# Patient Record
Sex: Male | Born: 1965 | Race: Black or African American | Hispanic: No | Marital: Single | Smoking: Never smoker
Health system: Southern US, Community
[De-identification: ages and names within clinical notes are randomized; demographics above are authoritative.]

## PROBLEM LIST (undated history)

## (undated) DIAGNOSIS — M1990 Unspecified osteoarthritis, unspecified site: Secondary | ICD-10-CM

## (undated) DIAGNOSIS — N3281 Overactive bladder: Secondary | ICD-10-CM

## (undated) DIAGNOSIS — I10 Essential (primary) hypertension: Secondary | ICD-10-CM

## (undated) DIAGNOSIS — E119 Type 2 diabetes mellitus without complications: Secondary | ICD-10-CM

## (undated) DIAGNOSIS — M778 Other enthesopathies, not elsewhere classified: Secondary | ICD-10-CM

## (undated) HISTORY — PX: BACK SURGERY: SHX140

## (undated) HISTORY — DX: Other enthesopathies, not elsewhere classified: M77.8

## (undated) HISTORY — DX: Essential (primary) hypertension: I10

---

## 2005-12-23 ENCOUNTER — Emergency Department (HOSPITAL_COMMUNITY): Admission: EM | Admit: 2005-12-23 | Discharge: 2005-12-23 | Payer: Self-pay | Admitting: Family Medicine

## 2005-12-25 ENCOUNTER — Emergency Department (HOSPITAL_COMMUNITY): Admission: EM | Admit: 2005-12-25 | Discharge: 2005-12-25 | Payer: Self-pay | Admitting: Family Medicine

## 2005-12-28 ENCOUNTER — Emergency Department (HOSPITAL_COMMUNITY): Admission: EM | Admit: 2005-12-28 | Discharge: 2005-12-28 | Payer: Self-pay | Admitting: Family Medicine

## 2006-03-02 ENCOUNTER — Ambulatory Visit: Payer: Self-pay | Admitting: Internal Medicine

## 2006-03-08 ENCOUNTER — Ambulatory Visit: Payer: Self-pay | Admitting: Internal Medicine

## 2006-04-09 ENCOUNTER — Ambulatory Visit: Payer: Self-pay | Admitting: Internal Medicine

## 2006-04-30 ENCOUNTER — Ambulatory Visit: Payer: Self-pay | Admitting: Internal Medicine

## 2006-04-30 LAB — CONVERTED CEMR LAB
Calcium: 8.9 mg/dL (ref 8.4–10.5)
GFR calc non Af Amer: 78 mL/min
Glomerular Filtration Rate, Af Am: 94 mL/min/{1.73_m2}
Glucose, Bld: 97 mg/dL (ref 70–99)

## 2006-05-10 ENCOUNTER — Ambulatory Visit: Payer: Self-pay | Admitting: Internal Medicine

## 2006-09-11 ENCOUNTER — Ambulatory Visit: Payer: Self-pay | Admitting: Internal Medicine

## 2006-09-11 DIAGNOSIS — I10 Essential (primary) hypertension: Secondary | ICD-10-CM | POA: Insufficient documentation

## 2006-09-13 ENCOUNTER — Ambulatory Visit: Payer: Self-pay | Admitting: *Deleted

## 2006-09-17 ENCOUNTER — Ambulatory Visit: Payer: Self-pay | Admitting: Internal Medicine

## 2006-09-25 ENCOUNTER — Ambulatory Visit: Payer: Self-pay | Admitting: Internal Medicine

## 2006-10-11 ENCOUNTER — Ambulatory Visit: Payer: Self-pay | Admitting: Internal Medicine

## 2007-02-14 ENCOUNTER — Encounter: Payer: Self-pay | Admitting: Internal Medicine

## 2007-09-26 ENCOUNTER — Ambulatory Visit: Payer: Self-pay | Admitting: Internal Medicine

## 2007-09-26 ENCOUNTER — Ambulatory Visit (HOSPITAL_COMMUNITY): Admission: RE | Admit: 2007-09-26 | Discharge: 2007-09-26 | Payer: Self-pay | Admitting: Internal Medicine

## 2007-10-14 ENCOUNTER — Telehealth (INDEPENDENT_AMBULATORY_CARE_PROVIDER_SITE_OTHER): Payer: Self-pay | Admitting: Internal Medicine

## 2007-10-25 ENCOUNTER — Ambulatory Visit: Payer: Self-pay | Admitting: Internal Medicine

## 2007-10-31 ENCOUNTER — Encounter (INDEPENDENT_AMBULATORY_CARE_PROVIDER_SITE_OTHER): Payer: Self-pay | Admitting: Internal Medicine

## 2007-10-31 ENCOUNTER — Encounter: Admission: RE | Admit: 2007-10-31 | Discharge: 2008-01-10 | Payer: Self-pay | Admitting: Internal Medicine

## 2007-11-11 ENCOUNTER — Ambulatory Visit: Payer: Self-pay | Admitting: Internal Medicine

## 2007-11-20 ENCOUNTER — Encounter (INDEPENDENT_AMBULATORY_CARE_PROVIDER_SITE_OTHER): Payer: Self-pay | Admitting: Internal Medicine

## 2007-12-19 ENCOUNTER — Ambulatory Visit: Payer: Self-pay | Admitting: Internal Medicine

## 2007-12-19 DIAGNOSIS — R609 Edema, unspecified: Secondary | ICD-10-CM | POA: Insufficient documentation

## 2008-01-02 ENCOUNTER — Ambulatory Visit: Payer: Self-pay | Admitting: Internal Medicine

## 2008-01-06 ENCOUNTER — Telehealth (INDEPENDENT_AMBULATORY_CARE_PROVIDER_SITE_OTHER): Payer: Self-pay | Admitting: *Deleted

## 2008-01-16 ENCOUNTER — Encounter (INDEPENDENT_AMBULATORY_CARE_PROVIDER_SITE_OTHER): Payer: Self-pay | Admitting: *Deleted

## 2008-01-28 ENCOUNTER — Ambulatory Visit: Payer: Self-pay | Admitting: Internal Medicine

## 2008-04-02 ENCOUNTER — Encounter (INDEPENDENT_AMBULATORY_CARE_PROVIDER_SITE_OTHER): Payer: Self-pay | Admitting: Internal Medicine

## 2008-05-22 ENCOUNTER — Telehealth (INDEPENDENT_AMBULATORY_CARE_PROVIDER_SITE_OTHER): Payer: Self-pay | Admitting: Internal Medicine

## 2008-06-12 ENCOUNTER — Ambulatory Visit: Payer: Self-pay | Admitting: Internal Medicine

## 2008-06-12 LAB — CONVERTED CEMR LAB
Bilirubin Urine: NEGATIVE
Nitrite: NEGATIVE
Urobilinogen, UA: 0.2

## 2008-06-19 LAB — CONVERTED CEMR LAB
Alkaline Phosphatase: 88 units/L (ref 39–117)
BUN: 17 mg/dL (ref 6–23)
Creatinine, Ser: 0.96 mg/dL (ref 0.40–1.50)
Glucose, Bld: 109 mg/dL — ABNORMAL HIGH (ref 70–99)
Total Bilirubin: 0.5 mg/dL (ref 0.3–1.2)

## 2008-06-26 ENCOUNTER — Ambulatory Visit: Payer: Self-pay | Admitting: Internal Medicine

## 2008-06-26 LAB — CONVERTED CEMR LAB: Blood Glucose, Fingerstick: 107

## 2008-07-14 ENCOUNTER — Ambulatory Visit: Payer: Self-pay | Admitting: Internal Medicine

## 2008-07-23 ENCOUNTER — Ambulatory Visit: Payer: Self-pay | Admitting: Internal Medicine

## 2008-07-23 LAB — CONVERTED CEMR LAB
Blood in Urine, dipstick: NEGATIVE
Glucose, Urine, Semiquant: NEGATIVE

## 2008-08-07 ENCOUNTER — Ambulatory Visit: Payer: Self-pay | Admitting: Internal Medicine

## 2008-08-07 LAB — CONVERTED CEMR LAB
BUN: 16 mg/dL (ref 6–23)
Band Neutrophils: 0 % (ref 0–10)
Basophils Absolute: 0 10*3/uL (ref 0.0–0.1)
Basophils Relative: 0 % (ref 0–1)
Bilirubin Urine: NEGATIVE
Calcium: 9.3 mg/dL (ref 8.4–10.5)
Cholesterol: 137 mg/dL (ref 0–200)
Creatinine, Ser: 0.89 mg/dL (ref 0.40–1.50)
Eosinophils Relative: 1 % (ref 0–5)
Glucose, Bld: 99 mg/dL (ref 70–99)
Glucose, Urine, Semiquant: NEGATIVE
Lymphocytes Relative: 46 % (ref 12–46)
MCV: 82 fL (ref 78.0–100.0)
OCCULT 1: NEGATIVE
OCCULT 2: NEGATIVE
Platelets: 232 10*3/uL (ref 150–400)
Potassium: 4.2 meq/L (ref 3.5–5.3)
RDW: 14.3 % (ref 11.5–15.5)
Triglycerides: 80 mg/dL (ref <150)
Urobilinogen, UA: 0.2
VLDL: 16 mg/dL (ref 0–40)
WBC Urine, dipstick: NEGATIVE
WBC: 4.6 10*3/uL (ref 4.0–10.5)
pH: 6

## 2008-08-23 ENCOUNTER — Encounter (INDEPENDENT_AMBULATORY_CARE_PROVIDER_SITE_OTHER): Payer: Self-pay | Admitting: Internal Medicine

## 2008-09-22 ENCOUNTER — Encounter (INDEPENDENT_AMBULATORY_CARE_PROVIDER_SITE_OTHER): Payer: Self-pay | Admitting: Internal Medicine

## 2008-09-22 ENCOUNTER — Ambulatory Visit: Payer: Self-pay | Admitting: Internal Medicine

## 2008-11-05 ENCOUNTER — Encounter (INDEPENDENT_AMBULATORY_CARE_PROVIDER_SITE_OTHER): Payer: Self-pay | Admitting: Internal Medicine

## 2009-01-22 ENCOUNTER — Ambulatory Visit: Payer: Self-pay | Admitting: Internal Medicine

## 2009-01-22 DIAGNOSIS — E663 Overweight: Secondary | ICD-10-CM | POA: Insufficient documentation

## 2009-06-05 HISTORY — PX: TENDON RELEASE: SHX230

## 2009-07-01 ENCOUNTER — Telehealth (INDEPENDENT_AMBULATORY_CARE_PROVIDER_SITE_OTHER): Payer: Self-pay | Admitting: Internal Medicine

## 2009-07-16 ENCOUNTER — Ambulatory Visit: Payer: Self-pay | Admitting: Internal Medicine

## 2009-07-16 DIAGNOSIS — E1139 Type 2 diabetes mellitus with other diabetic ophthalmic complication: Secondary | ICD-10-CM | POA: Insufficient documentation

## 2009-07-16 LAB — CONVERTED CEMR LAB
Bilirubin Urine: NEGATIVE
Blood in Urine, dipstick: NEGATIVE
Glucose, Urine, Semiquant: NEGATIVE
Ketones, urine, test strip: NEGATIVE
OCCULT 1: NEGATIVE
OCCULT 2: NEGATIVE
Protein, U semiquant: 30
Urobilinogen, UA: 1

## 2009-07-30 ENCOUNTER — Ambulatory Visit: Payer: Self-pay | Admitting: Internal Medicine

## 2009-07-30 LAB — CONVERTED CEMR LAB
Bilirubin Urine: NEGATIVE
Glucose, Urine, Semiquant: NEGATIVE
Hgb A1c MFr Bld: 5.8 %
Protein, U semiquant: NEGATIVE
WBC Urine, dipstick: NEGATIVE
pH: 6

## 2009-08-07 LAB — CONVERTED CEMR LAB
Alkaline Phosphatase: 61 units/L (ref 39–117)
Basophils Absolute: 0 10*3/uL (ref 0.0–0.1)
Basophils Relative: 0 % (ref 0–1)
CO2: 26 meq/L (ref 19–32)
Cholesterol: 139 mg/dL (ref 0–200)
Creatinine, Ser: 0.92 mg/dL (ref 0.40–1.50)
Eosinophils Relative: 1 % (ref 0–5)
Glucose, Bld: 94 mg/dL (ref 70–99)
HCT: 47.3 % (ref 39.0–52.0)
HDL: 42 mg/dL (ref >39)
Hemoglobin: 15.9 g/dL (ref 13.0–17.0)
LDL Cholesterol: 85 mg/dL (ref 0–99)
Lymphocytes Relative: 53 % — ABNORMAL HIGH (ref 12–46)
MCHC: 33.6 g/dL (ref 30.0–36.0)
Monocytes Absolute: 0.3 10*3/uL (ref 0.1–1.0)
Neutro Abs: 1.4 10*3/uL — ABNORMAL LOW (ref 1.7–7.7)
Platelets: 212 10*3/uL (ref 150–400)
RDW: 14 % (ref 11.5–15.5)
Total Bilirubin: 0.8 mg/dL (ref 0.3–1.2)
Total CHOL/HDL Ratio: 3.3
Triglycerides: 61 mg/dL (ref <150)
VLDL: 12 mg/dL (ref 0–40)

## 2009-08-27 ENCOUNTER — Ambulatory Visit: Payer: Self-pay | Admitting: Internal Medicine

## 2009-09-22 ENCOUNTER — Encounter: Admission: RE | Admit: 2009-09-22 | Discharge: 2009-09-22 | Payer: Self-pay | Admitting: Internal Medicine

## 2009-09-23 ENCOUNTER — Encounter (INDEPENDENT_AMBULATORY_CARE_PROVIDER_SITE_OTHER): Payer: Self-pay | Admitting: Internal Medicine

## 2009-11-19 ENCOUNTER — Telehealth (INDEPENDENT_AMBULATORY_CARE_PROVIDER_SITE_OTHER): Payer: Self-pay | Admitting: Internal Medicine

## 2010-01-28 ENCOUNTER — Telehealth (INDEPENDENT_AMBULATORY_CARE_PROVIDER_SITE_OTHER): Payer: Self-pay | Admitting: Internal Medicine

## 2010-02-02 ENCOUNTER — Ambulatory Visit: Payer: Self-pay | Admitting: Internal Medicine

## 2010-02-22 ENCOUNTER — Ambulatory Visit: Payer: Self-pay | Admitting: Internal Medicine

## 2010-05-27 ENCOUNTER — Ambulatory Visit
Admission: RE | Admit: 2010-05-27 | Discharge: 2010-05-27 | Payer: Self-pay | Source: Home / Self Care | Attending: Orthopedic Surgery | Admitting: Orthopedic Surgery

## 2010-06-27 ENCOUNTER — Ambulatory Visit
Admission: RE | Admit: 2010-06-27 | Discharge: 2010-06-27 | Payer: Self-pay | Source: Home / Self Care | Attending: Internal Medicine | Admitting: Internal Medicine

## 2010-07-05 NOTE — Letter (Signed)
Summary: [facility]  [facility]   Imported By: Arta Bruce 11/18/2009 15:19:44  _____________________________________________________________________  External Attachment:    Type:   Image     Comment:   External Document

## 2010-07-05 NOTE — Progress Notes (Signed)
  Phone Note Call from Patient   Summary of Call: Pt still trying to get in with [facility],  but they told the pt if you would call they could get him in sooner.  Their number is [phone].  Also pt wanted to tell you thank you for everything you did for him while he was a pt here. Initial call taken by: Vesta Mixer CMA,  January 28, 2010 3:25 PM  Follow-up for Phone Call        Did he sign a release of information so we can send information to [facility]?  If not, he can do it there or here.   Follow-up by: Julieanne Manson MD,  February 02, 2010 8:22 AM  Additional Follow-up for Phone Call Additional follow up Details #1::        pt is now at [facility] and office notes have already been sent from our office Additional Follow-up by: Michelle Nasuti,  February 02, 2010 10:20 AM

## 2010-07-05 NOTE — Assessment & Plan Note (Signed)
Summary: 6 month fu for cpp///kt   Vital Signs:  Patient profile:   45 year old male Weight:      293.7 pounds BMI:     37.85 Temp:     97.9 degrees F oral Pulse rate:   87 / minute Pulse rhythm:   regular Resp:     18 per minute BP sitting:   143 / 90  (left arm) Cuff size:   large  Vitals Entered By: Geanie Cooley (July 16, 2009 3:58 PM) CC: Pt here for CPP. Pt states he has been having problems with his right foot and leg, hes been having numbness in it and the right leg and foot is swollen. Pt has questions about PPD, he thinks he has some of those symptoms in his leg. Is Patient Diabetic? No Pain Assessment Patient in pain? yes     Location: Right foot Intensity: 3 Type: numbness  Does patient need assistance? Functional Status Self care Ambulation Normal   CC:  Pt here for CPP. Pt states he has been having problems with his right foot and leg, hes been having numbness in it and the right leg and foot is swollen. Pt has questions about PPD, and he thinks he has some of those symptoms in his leg.Marland Kitchen  History of Present Illness: 45 yo male here for CPE.  Concerns:  1.  Hypertension:  Has not been taking HCTZ as he was not sure what he was supposed to be doing.  2.  Vision:  decreased in past year.  Has not tried getting reading glasses.  Does have insurance to go to an eye doctor.  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  Pain in right leg.  Knee and lower leg swollen at times.  Habits & Providers  Alcohol-Tobacco-Diet     Alcohol drinks/day: <1     Alcohol type: beer, cognac     Tobacco Status: never  Exercise-Depression-Behavior     Drug Use: marijuana occasionally several years ago.  Allergies (verified): No Known Drug Allergies  Past History:  Past Medical History: Reviewed history from 07/23/2008 and no changes required. JOINT EFFUSION, RIGHT KNEE (ICD-719.06) ANAL FISSURE (ICD-565.0) PERIPHERAL EDEMA (ICD-782.3) TENDINITIS, RIGHT KNEE  (ICD-726.69) BURSITIS, RIGHT KNEE (ICD-726.60) ESSENTIAL HYPERTENSION, BENIGN (ICD-401.1) FREQUENCY, URINARY (ICD-788.41) GROIN PAIN (ICD-789.09)  Past Surgical History: Reviewed history from 07/23/2008 and no changes required. None  Family History: Mother, 78:   Generally healthy,  Father, 84:  Hx of prostate Ca--diagnosed age 75.   2 Brothers, one 74 with hypertension, schizophrenia.  Other is healthy Sister, 19:  obese. Son, 12:  healthy  Social History: [employer]-does not want a blood transfusion--joined church in November Never married. Involved in son's life. [employer]--Maintenance electrician.Drug Use:  marijuana occasionally several years ago.  Review of Systems General:  Energy good.. Eyes:  Complains of blurring; Blurry with distance vision. ENT:  Denies decreased hearing. CV:  Denies chest pain or discomfort and palpitations; Has had right foot and leg swelling 3-4 months.  Has at times had swelling of the legs--right worse than left for years.. Resp:  Denies shortness of breath. GI:  Denies abdominal pain, bloody stools, constipation, dark tarry stools, and diarrhea. GU:  Urinary urge. Good stream No nocturia No hesitancy. Hx of chlamydia 3 years ago. MS:  Right knee and lower leg pain--right knee, however, doing better Right ankle has been more swollen and hurts--feels like it is sprained, but has not injured.  No definite erythema or heat  from the joint.. Derm:  Flaking and shininess on legs--right worse than left.  . Neuro:  Denies numbness, tingling, and weakness. Psych:  Denies anxiety, depression, and suicidal thoughts/plans; Feels well now that has a job.Marland Kitchen  Physical Exam  General:  obese,  no acute distress; alert,appropriate and cooperative throughout examination Head:  Normocephalic and atraumatic without obvious abnormalities. Eyes:  No corneal or conjunctival inflammation noted. EOMI. Perrla. Funduscopic exam benign, without hemorrhages, exudates  or papilledema. Vision grossly normal. Ears:  External ear exam shows no significant lesions or deformities.  Otoscopic examination reveals clear canals, tympanic membranes are intact bilaterally without bulging, retraction, inflammation or discharge. Hearing is grossly normal bilaterally. Nose:  External nasal examination shows no deformity or inflammation. Nasal mucosa are pink and moist without lesions or exudates. Mouth:  Oral mucosa and oropharynx without lesions or exudates.   Neck:  No deformities, masses, or tenderness noted. Lungs:  Normal respiratory effort, chest expands symmetrically. Lungs are clear to auscultation, no crackles or wheezes. Heart:  Normal rate and regular rhythm. S1 and S2 normal without gallop, murmur, click, rub or other extra sounds. Abdomen:  Bowel sounds positive,abdomen soft and non-tender without masses, organomegaly or hernias noted. Rectal:  No external abnormalities noted. Normal sphincter tone. No rectal masses or tenderness.  Heme negative light brown stool Genitalia:  Testes bilaterally descended without nodularity, tenderness or masses. No scrotal masses or lesions. No penis lesions or urethral discharge.  large pannus overlying genitalia Prostate:  Prostate gland firm and smooth, no enlargement, nodularity, tenderness, mass, asymmetry or induration. Msk:  No deformity or scoliosis noted of thoracic or lumbar spine.   Pulses:  R and L carotid,radial,femoral,dorsalis pedis and posterior tibial pulses are full and equal bilaterally Extremities:  Hypertrophic changes of knees.  No effusions or erythema. Broken small veins at edges of feet bilaterally. Shiny skin of pretibial areas, mild edema.  No erythema Neurologic:  No cranial nerve deficits noted. Station and gait are normal. Plantar reflexes are down-going bilaterally. DTRs are symmetrical throughout. Sensory, motor and coordinative functions appear intact. Skin:  Intact without suspicious lesions or  rashes Cervical Nodes:  No lymphadenopathy noted Axillary Nodes:  No palpable lymphadenopathy Inguinal Nodes:  No significant adenopathy Psych:  Cognition and judgment appear intact. Alert and cooperative with normal attention span and concentration. No apparent delusions, illusions, hallucinations   Impression & Recommendations:  Problem # 1:  HEALTH MAINTENANCE EXAM (ICD-V70.0)  Orders: Hemoccult Cards -3 specimans (take home) (21308)  Problem # 2:  DIABETIC  RETINOPATHY (ICD-250.50)  His updated medication list for this problem includes:    Cozaar 100 Mg Tabs (Losartan potassium) .Marland Kitchen... 1 tab by mouth daily  Orders: Ophthalmology Referral (Ophthalmology)  Problem # 3:  ESSENTIAL HYPERTENSION, BENIGN (ICD-401.1) not controlled His updated medication list for this problem includes:    Toprol Xl 25 Mg Xr24h-tab (Metoprolol succinate) .Marland Kitchen... 1 tab by mouth daily    Cozaar 100 Mg Tabs (Losartan potassium) .Marland Kitchen... 1 tab by mouth daily    Hydrochlorothiazide 25 Mg Tabs (Hydrochlorothiazide) .Marland Kitchen... 1/2 tab by mouth in morning  Orders: UA Dipstick w/o Micro (manual) (65784)  Problem # 4:  PERIPHERAL EDEMA (ICD-782.3) compression stockings His updated medication list for this problem includes:    Hydrochlorothiazide 25 Mg Tabs (Hydrochlorothiazide) .Marland Kitchen... 1/2 tab by mouth in morning  Problem # 5:  KNEE PAIN, RIGHT (ICD-719.46) will check labs with fasting blood work--do not believe this is gout r inflammatory joint problem, however.  Complete Medication List:  1)  Toprol Xl 25 Mg Xr24h-tab (Metoprolol succinate) .Marland Kitchen.. 1 tab by mouth daily 2)  Anusol-hc 25 Mg Supp (Hydrocortisone acetate) .Marland Kitchen.. 1 suppository per rectum after bowel movements and up to three times a day for pain 3)  Cozaar 100 Mg Tabs (Losartan potassium) .Marland Kitchen.. 1 tab by mouth daily 4)  Hydrochlorothiazide 25 Mg Tabs (Hydrochlorothiazide) .... 1/2 tab by mouth in morning 5)  Knee High Graduated Compression Stockings- 20 Mm   .Marland Kitchen.. 782.3--peripheral edema  Patient Instructions: 1)  Return in next 2 weeks for fasting labs:  FLP, A1C, CMET, UA, CBC, Uric Acid, sed rate, BP Check--take your meds that morning 2)  Bring in stool cards then 3)  Follow up with Dr. Delrae Alfred in 6 months --htn Prescriptions: HYDROCHLOROTHIAZIDE 25 MG TABS (HYDROCHLOROTHIAZIDE) 1/2 tab by mouth in morning  #30 x 11   Entered and Authorized by:   Julieanne Manson MD   Signed by:   Julieanne Manson MD on 07/16/2009   Method used:   Print then Give to Patient   RxID:   [id number] COZAAR 100 MG TABS (LOSARTAN POTASSIUM) 1 tab by mouth daily  #30 x 11   Entered and Authorized by:   Julieanne Manson MD   Signed by:   Julieanne Manson MD on 07/16/2009   Method used:   Print then Give to Patient   RxID:   [id number] TOPROL XL 25 MG  XR24H-TAB (METOPROLOL SUCCINATE) 1 tab by mouth daily  #30 x 11   Entered and Authorized by:   Julieanne Manson MD   Signed by:   Julieanne Manson MD on 07/16/2009   Method used:   Print then Give to Patient   RxID:   [id number] KNEE HIGH GRADUATED COMPRESSION STOCKINGS-  20 MM 782.3--peripheral edema  #2 pair x 0   Entered and Authorized by:   Julieanne Manson MD   Signed by:   Julieanne Manson MD on 07/16/2009   Method used:   Print then Give to Patient   RxID:   [id number] HYDROCHLOROTHIAZIDE 25 MG TABS (HYDROCHLOROTHIAZIDE) 1/2 tab by mouth in morning  #30 x 11   Entered and Authorized by:   Julieanne Manson MD   Signed by:   Julieanne Manson MD on 07/16/2009   Method used:   Faxed to ...       [facility] (retail)       [address] [place], [place]  [address]       Ph: [phone]       Fax: [phone]   RxID:   [id number]    Preventive Care Screening  Prior Values:    PSA:  0.34 (08/07/2008)    Last Tetanus Booster:  Tdap (07/23/2008)     Immunizations:  has not been immunized against the  flu--discussed. STE:  Regular in shower Guaiac Cards:  states he brought in last year--but no documentation of such Colonoscopy:  no   Laboratory Results   Urine Tests  Date/Time Received: July 16, 2009 4:24 PM   Routine Urinalysis   Color: lt. yellow Appearance: Clear Glucose: negative   (Normal Range: Negative) Bilirubin: negative   (Normal Range: Negative) Ketone: negative   (Normal Range: Negative) Spec. Gravity: 1.020   (Normal Range: 1.003-1.035) Blood: negative   (Normal Range: Negative) pH: 6.0   (Normal Range: 5.0-8.0) Protein: 30   (Normal Range: Negative) Urobilinogen: 1.0   (Normal Range: 0-1) Nitrite: negative   (Normal Range: Negative)  Leukocyte Esterace: small   (Normal Range: Negative)      Stool - Occult Blood Hemmoccult #1: negative Date: 07/30/2009 Hemoccult #2: negative Date: 07/30/2009 Hemoccult #3: negative Date: 07/30/2009

## 2010-07-05 NOTE — Progress Notes (Signed)
  Phone Note Call from Patient Call back at [phone]   Summary of Call: The pt wants the Dr Delrae Alfred call him back because he wanted to know which physician should the provider referral him to [facility] because he  has [employer] card.  This in reference blood pressue and boarder dm.  [facility] [address] [phone]) Delrae Alfred MD  Initial call taken by: Manon Hilding,  November 19, 2009 4:09 PM  Follow-up for Phone Call        Does he have a list of the providers? Follow-up by: Julieanne Manson MD,  November 22, 2009 6:13 PM  Additional Follow-up for Phone Call Additional follow up Details #1::        Spoke with pt he does not have a book since almost everyone takes it, just would like to know who you reccomened as a good pcp for him. Additional Follow-up by: Vesta Mixer CMA,  November 23, 2009 11:34 AM    Additional Follow-up for Phone Call Additional follow up Details #2::    I would go to [facility]  [phone]. Follow-up by: Julieanne Manson MD,  November 24, 2009 6:07 PM  Additional Follow-up for Phone Call Additional follow up Details #3:: Details for Additional Follow-up Action Taken: pt aware.............. Elmarie Shiley McCoy CMA  November 25, 2009 11:59 AM

## 2010-07-05 NOTE — Progress Notes (Signed)
Summary: medication clarifications  Phone Note Call from Patient Call back at [phone]   Reason for Call: Talk to Nurse Summary of Call: Tanzania Basham PT. MR Gresham SAYS THAT WHEN HE PICKEDUP HIS HYDROCHLOROTHIAZIDE THEY GAVE HIM THE 25MG  AND IT SAYS TAKE I TAB. DAILY. HIS QUESTION IS, IS WHEN HE WAS TAKING THEM BEFORE THE REFILL, HE WAS TAKING 12.5 1/2 TABLET AND THEY ONLY GAVE HIM 15, SO IS HE SUPOSE TO BE TAKING THE 25MG  1 TABLET? Initial call taken by: Leodis Rains,  July 01, 2009 10:17 AM  Follow-up for Phone Call        will have Nathan Moctezuma to review medications...in chart states hctz 25mg  1daily unless pt was told verbally to make change. left message for pt to return call......... Follow-up by: Mikey College CMA,  July 01, 2009 12:46 PM  Additional Follow-up for Phone Call Additional follow up Details #1::        spoke with patient and he states that take hctz 25mg  1/2 tab was given last month and this month rx stated hctz 25mg  daily.  currently pt is taking hctz 25mg  1/2 tab                                  toprol 25mg  1 daily                                  hyzaar 100-12.5mg mg                                   (take with hctz) Pt needs clarification on medications..waiting on printout from the pharmacy Additional Follow-up by: Mikey College CMA,  July 01, 2009 4:18 PM    Additional Follow-up for Phone Call Additional follow up Details #2::    Called--left message that he should indeed be taking 1/2 tab of HCTZ 25 mg. He is to let me know, however, if he is also taking Hyzaar, because then he is doubling up on the HCTZ.  He really should just be taking Cozaar, not hyzaar. Called and left message to let us know the above again   Julieanne Manson MD  July 06, 2009 5:51 PM  Follow-up by: Julieanne Manson MD,  July 01, 2009 5:47 PM  Additional Follow-up for Phone Call Additional follow up Details #3:: Details for Additional Follow-up Action Taken: Pt called back and  has bottles for Toprol 25, Hyzaar 100/12.5 one po qd and Hctz 25mg  one po qd with Cozaar, but no Cozaar bottle. Pt can be reached at [phone]................ Elmarie Shiley McCoy CMA  July 08, 2009 9:18 AM Please call [facility] pharmacy and clarify what they are giving him--make sure he has picked up appropriate meds.  He was to be switched from Hyzaar to Cozaar--I though they did not have the Hyzaar.  Julieanne Manson MD  July 13, 2009 9:10 AM    Spoke with Danella Deis? at [facility] pharmacy.  She shows pt picking up on 06/29/09 for losartan, toprol and hctz no hyzaar. Pt has an appt.this Friday for a CPE.................. Tiffany McCoy CMA  July 14, 2009 11:13 AM

## 2010-07-18 ENCOUNTER — Ambulatory Visit: Payer: Self-pay | Admitting: Internal Medicine

## 2010-07-29 ENCOUNTER — Ambulatory Visit: Payer: Self-pay | Admitting: Internal Medicine

## 2010-08-04 ENCOUNTER — Ambulatory Visit (INDEPENDENT_AMBULATORY_CARE_PROVIDER_SITE_OTHER): Payer: BC Managed Care – PPO | Admitting: Internal Medicine

## 2010-08-04 DIAGNOSIS — I10 Essential (primary) hypertension: Secondary | ICD-10-CM

## 2010-08-15 LAB — POCT HEMOGLOBIN-HEMACUE: Hemoglobin: 15.8 g/dL (ref 13.0–17.0)

## 2010-08-15 LAB — BASIC METABOLIC PANEL
BUN: 18 mg/dL (ref 6–23)
CO2: 23 mEq/L (ref 19–32)
Chloride: 105 mEq/L (ref 96–112)
Glucose, Bld: 120 mg/dL — ABNORMAL HIGH (ref 70–99)
Potassium: 4.5 mEq/L (ref 3.5–5.1)

## 2010-08-26 ENCOUNTER — Other Ambulatory Visit (HOSPITAL_COMMUNITY): Payer: Self-pay | Admitting: Adult Health

## 2010-08-26 DIAGNOSIS — R310 Gross hematuria: Secondary | ICD-10-CM

## 2010-09-01 ENCOUNTER — Ambulatory Visit (HOSPITAL_COMMUNITY)
Admission: RE | Admit: 2010-09-01 | Discharge: 2010-09-01 | Disposition: A | Discharge status: Home / Self Care | Payer: BC Managed Care – PPO | Source: Ambulatory Visit | Attending: Diagnostic Radiology | Admitting: Diagnostic Radiology

## 2010-09-01 DIAGNOSIS — N281 Cyst of kidney, acquired: Secondary | ICD-10-CM | POA: Insufficient documentation

## 2010-09-01 DIAGNOSIS — R935 Abnormal findings on diagnostic imaging of other abdominal regions, including retroperitoneum: Secondary | ICD-10-CM | POA: Insufficient documentation

## 2010-09-01 DIAGNOSIS — R310 Gross hematuria: Secondary | ICD-10-CM

## 2010-09-01 MED ORDER — GADOBENATE DIMEGLUMINE 529 MG/ML IV SOLN
20.0000 mL | Freq: Once | INTRAVENOUS | Status: AC | PRN
  Administered 2010-09-01: 20 mL via INTRAVENOUS

## 2010-10-21 NOTE — Assessment & Plan Note (Signed)
[facility]                             PRIMARY CARE OFFICE NOTE   Chad, Gardner                       MRN:          [id number]  DATE:03/08/2006                            DOB:          1965/12/15    CHIEF COMPLAINT:  New patient to practice.   HISTORY OF PRESENT ILLNESS:  The patient is a 45 year old African-American  male here to establish primary care.  He has not had a primary care  physician in the past.  He has been evaluated by [facility],  most recently in July 2007.  He presented with swelling of his perineum  area/groin and also was found to have possible UTI and possibly urethritis.  He was treated with Rocephin and doxycycline.  He has since then noticed an  uncomfortable sensation in his groin/testicles.  He has had increased  pigmentation along his groin and notes a pulling sensation in his testicles.   During his evaluation he was noted to have elevated blood pressure, 150  systolic and 90s diastolic.  The patient states that he was told that his  blood pressure was elevated in the past; however, he never followed up with  a primary care physician.   He has struggled with his weight and is currently at his heaviest weight of  292 pounds.  He is not aware that he is diabetic.  His blood test when he  was in the urgent care was not elevated.   PAST MEDICAL HISTORY SUMMARY:  1. Obesity.  2. Hypertension, uncontrolled.  3. Testicular discomfort.  4. Tobacco abuse.  5. Alcohol use.   CURRENT MEDICATIONS:  None.   ALLERGIES TO MEDICATIONS:  None known.   SOCIAL HISTORY:  The patient is single, does have a 82-year-old son.  The  patient states that he has had numerous sexual partners, currently has not  been sexually active for the last several months.   FAMILY HISTORY:  Mother is alive at age 19.  Father also approximately age  88 has a history of prostate cancer.  No history of diabetes or early  coronary  artery disease.   HABITS:  He drinks beer regularly as well as also some liquor.  He averages  approximately a case of beer per week plus or minus some liquor.   He smokes approximately one pack per week.  He has been a light smoker since  his teenage years.   REVIEW OF SYSTEMS:  The patient denies any fevers, chills.  No HEENT  symptoms.  The patient denies any chest pain or shortness of breath.  No  heartburn, nausea, vomiting, constipation, diarrhea.  The patient has some  abnormal sensation with urination, does not describe burning but doesn't  feel right.  No musculoskeletal complaints.  No penile drip.  No polyuria  or polydipsia.  All other systems negative.   PHYSICAL EXAMINATION:  VITAL SIGNS:  Weight is 282 pounds, temperature 97.5,  pulse is 70, BP is 147/93.  GENERAL:  The patient is a tall, overweight African-American male in no  apparent distress.  HEENT:  Normocephalic,  atraumatic.  Pupils are equal and reactive to light  bilaterally.  Extraocular motility was intact.  The patient was anicteric.  Conjunctivae was within normal limits.  External auditory canals and  tympanic membranes are clear bilaterally.  Oropharynx was unremarkable.  Upper airway was somewhat crowded.  Hearing was grossly normal.  NECK:  Thick.  No adenopathy, carotid bruit or thyromegaly.  The patient did  have thickened skin in the posterior aspects of his neck, possible  acanthosis nigricans.  CHEST:  Normal respiratory effort.  Chest was clear to auscultation  bilaterally.  No rhonchi, rales, or wheezing.  CARDIOVASCULAR:  Regular rate and rhythm.  No significant murmurs, rubs, or  gallops appreciated.  ABDOMEN:  Protuberant, nontender, positive bowel sounds, no organomegaly.  Examination of his lower abdomen, there were no signs of any hernia or  protrusion.  GENITOURINARY:  Normal testes, slight increased fullness on the right side.  No pain with palpation, no abnormality appreciated in his  inguinal ring.  MUSCULOSKELETAL:  No clubbing, cyanosis, or edema.  The patient had mild  stasis dermatitis with increased pigmentation of his ankles.  NEUROLOGIC:  Cranial nerves II-XII was grossly intact.  He was nonfocal and  his mood and affect were appropriate.   LABORATORY RESULTS:  CBC showed H&H of 14.6 and 44.2.  Total cholesterol was  133, triglycerides 158, LDL was 70.  TSH was normal at 0.63.  PSA was also  normal at 0.4.  Fasting glucose was 92, BUN 12, creatinine 1.1.  LFTs were  unremarkable.   IMPRESSION/RECOMMENDATIONS:  1. Hypertension, uncontrolled.  2. Obesity.  3. Testicular discomfort.  4. Dyslipidemia.  5. Health maintenance.   The patient will be started on benazepril/amlodipine 10/5 one a day.  I  suspect that the patient's increased discoloration of his groin may be  chronic tinea cruris and he was given a prescription for Spectazole cream to  apply once a day for the next 2 weeks.  He will be referred to urology for  further evaluation of his testicular discomfort and may need testicular  ultrasound.  In addition, with his urinary symptoms the patient may be at  risk for having chronic prostatitis.  Consideration for Cipro therapy for 3-  4 weeks is a possibility.   In terms of his dyslipidemia, stressed the importance of dietary changes  including decreased alcohol use, limit of approximately a 6-pack per week.  Followup time will be in approximately 2-4 weeks.       Barbette Hair. Artist Pais, DO      RDY/MedQ  DD:  03/09/2006  DT:  03/12/2006  Job #:  [id number]

## 2011-01-23 ENCOUNTER — Encounter: Payer: Self-pay | Admitting: Internal Medicine

## 2011-01-24 ENCOUNTER — Ambulatory Visit (INDEPENDENT_AMBULATORY_CARE_PROVIDER_SITE_OTHER): Payer: BC Managed Care – PPO | Admitting: Internal Medicine

## 2011-01-24 ENCOUNTER — Encounter: Payer: Self-pay | Admitting: Internal Medicine

## 2011-01-24 VITALS — BP 138/82 | HR 72 | Temp 97.1°F | Ht 74.5 in | Wt 275.5 lb

## 2011-01-24 DIAGNOSIS — I10 Essential (primary) hypertension: Secondary | ICD-10-CM

## 2011-01-24 DIAGNOSIS — G44209 Tension-type headache, unspecified, not intractable: Secondary | ICD-10-CM

## 2011-01-24 DIAGNOSIS — E119 Type 2 diabetes mellitus without complications: Secondary | ICD-10-CM

## 2011-02-17 ENCOUNTER — Encounter: Payer: Self-pay | Admitting: Internal Medicine

## 2011-02-17 ENCOUNTER — Ambulatory Visit (INDEPENDENT_AMBULATORY_CARE_PROVIDER_SITE_OTHER): Payer: BC Managed Care – PPO | Admitting: Internal Medicine

## 2011-02-17 VITALS — BP 138/82 | HR 76 | Temp 97.0°F | Ht 73.5 in | Wt 272.0 lb

## 2011-02-17 DIAGNOSIS — E119 Type 2 diabetes mellitus without complications: Secondary | ICD-10-CM

## 2011-02-17 DIAGNOSIS — Z131 Encounter for screening for diabetes mellitus: Secondary | ICD-10-CM

## 2011-02-17 DIAGNOSIS — Z0000 Encounter for general adult medical examination without abnormal findings: Secondary | ICD-10-CM

## 2011-02-17 DIAGNOSIS — I10 Essential (primary) hypertension: Secondary | ICD-10-CM

## 2011-02-17 LAB — LIPID PANEL
HDL: 46 mg/dL (ref >39)
LDL Cholesterol: 79 mg/dL (ref 0–99)
Triglycerides: 70 mg/dL (ref <150)

## 2011-02-17 LAB — COMPREHENSIVE METABOLIC PANEL
ALT: 23 U/L (ref 0–53)
BUN: 25 mg/dL — ABNORMAL HIGH (ref 6–23)
CO2: 25 mEq/L (ref 19–32)
Calcium: 9.4 mg/dL (ref 8.4–10.5)
Chloride: 102 mEq/L (ref 96–112)
Creat: 1 mg/dL (ref 0.50–1.35)
Glucose, Bld: 88 mg/dL (ref 70–99)

## 2011-02-17 LAB — POCT URINALYSIS DIPSTICK
Bilirubin, UA: NEGATIVE
Blood, UA: NEGATIVE
Ketones, UA: NEGATIVE
pH, UA: 6

## 2011-02-17 LAB — CBC
HCT: 47.6 % (ref 39.0–52.0)
Platelets: 212 10*3/uL (ref 150–400)
RBC: 5.56 MIL/uL (ref 4.22–5.81)
RDW: 13.9 % (ref 11.5–15.5)
WBC: 4.4 10*3/uL (ref 4.0–10.5)

## 2011-02-17 NOTE — Progress Notes (Signed)
  Subjective:    Patient ID: Chad Gardner, male    DOB: 1965/10/09, 45 y.o.   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 History of hypertension on 3 drug regimen. History of left wrist first dorsal compartment stenosing tenosynovitis status post release first dorsal compartment by Dr. Teressa Senter December 2011. Single never [place]. Does not smoke. Has been consuming some beer one 6 pack per week. Of asking him to watch beer consumption. Mainly drinks on weekends. Has a Nord[employer] track stationary bicycle that he uses several times a week. He is engaged to be married. He and fiance are Jehovah's Witnesses. She works in [place] but for now he is planning to keep his job he they have set a date for late December to get married.  Father with history of prostate cancer mother with history of hypertension one brother with history of hypertension and schizophrenia one brother alive and well one sister overweight patient has one son age 32 from a previous relationship patient says he had left Bell's palsy 15 years ago which resolved without permanent issues. No known drug allergies. Says he had tetanus immunization 2011 and help serve declines influenza immunization.   Review of Systems  Constitutional: Negative.   HENT: Negative.   Eyes: Negative.   Cardiovascular: Negative.   Gastrointestinal: Negative.   Genitourinary: Negative.   Musculoskeletal: Negative.   Neurological: Negative.   Hematological: Negative.   Psychiatric/Behavioral: Negative.        Objective:   Physical Exam  Vitals reviewed. Constitutional: He is oriented to person, place, and time. He appears well-nourished.  HENT:  Head: Normocephalic and atraumatic.  Right Ear: External ear normal.  Left Ear: External ear normal.  Eyes: EOM are normal. Pupils are equal, round, and reactive to light. Right eye exhibits no discharge. Left eye  exhibits no discharge. No scleral icterus.  Neck: Neck supple. No JVD present. No thyromegaly present.  Cardiovascular: Normal rate, regular rhythm and normal heart sounds.   Pulmonary/Chest: Effort normal and breath sounds normal.  Abdominal: Soft. Bowel sounds are normal. He exhibits no distension and no mass. There is no tenderness. There is no rebound and no guarding.  Genitourinary: Prostate normal.  Musculoskeletal: He exhibits no edema.  Lymphadenopathy:    He has no cervical adenopathy.  Neurological: He is alert and oriented to person, place, and time. He has normal reflexes.  Skin: No rash noted.  Psychiatric: He has a normal mood and affect. His behavior is normal. Judgment and thought content normal.          Assessment & Plan:  Hypertension  Obesity  Continue current medication regimen for hypertension. See again in 6 months. Fasting labs drawn and are pending. Encouraged diet exercise and weight loss.

## 2011-02-17 NOTE — Progress Notes (Signed)
Addended by: Judy Pimple on: 02/17/2011 10:56 AM   Modules accepted: Orders

## 2011-02-17 NOTE — Progress Notes (Signed)
Addended by: Judy Pimple on: 02/17/2011 12:30 PM   Modules accepted: Orders

## 2011-02-20 ENCOUNTER — Encounter: Payer: Self-pay | Admitting: Internal Medicine

## 2011-02-20 ENCOUNTER — Telehealth: Payer: Self-pay | Admitting: *Deleted

## 2011-02-20 NOTE — Telephone Encounter (Signed)
Left message for patient to call office.  A1C needs to be recollected since order was never released in EPIC.

## 2011-02-20 NOTE — Telephone Encounter (Signed)
Message copied by Daisy Blossom on Mon Feb 20, 2011  4:52 PM ------      Message from: Nolen Mu      Created: Mon Feb 20, 2011  4:21 PM       Please see why AIC has not been done

## 2011-02-20 NOTE — Telephone Encounter (Signed)
Pt will return for [facility] on Thursday Sept 20th at 4 pm.

## 2011-02-24 ENCOUNTER — Other Ambulatory Visit: Payer: BC Managed Care – PPO | Admitting: Internal Medicine

## 2011-02-24 DIAGNOSIS — Z131 Encounter for screening for diabetes mellitus: Secondary | ICD-10-CM

## 2011-02-26 ENCOUNTER — Encounter: Payer: Self-pay | Admitting: Internal Medicine

## 2011-03-02 NOTE — Progress Notes (Signed)
  Subjective:    Patient ID: Chad Gardner, male    DOB: 09/17/1965, 45 y.o.   MRN: [id number]  HPI patient with history of hypertension comes in complaining of occipital headache that had rather sudden onset at work. He's been under some stress. He is engaged. Trying to figure out how to work out upcoming marriage when fiance lives and works in [place]. He would like to keep this job hearing [place]. No visual disturbance. No syncope. He also has a history of mild diabetes diet controlled. Recently has lost some weight.    Review of Systems     Objective:   Physical Exam funduscopic exam shows a distance be sharp and flat bilaterally. TMs are clear. PERRLA; moves all 4 extremities, muscle strength is normal in all 4 extremities no ataxia; chest clear; cardiac exam regular rate and rhythm; deep tendon reflexes 2+ and symmetrical        Assessment & Plan:  Tension headache  Hypertension  Reassure patient. Headache only lasted for short time on one day and resolved

## 2011-08-01 ENCOUNTER — Ambulatory Visit (INDEPENDENT_AMBULATORY_CARE_PROVIDER_SITE_OTHER): Payer: BC Managed Care – PPO | Admitting: Internal Medicine

## 2011-08-01 ENCOUNTER — Encounter: Payer: Self-pay | Admitting: Internal Medicine

## 2011-08-01 VITALS — BP 130/96 | HR 64 | Temp 97.3°F | Wt 266.0 lb

## 2011-08-01 DIAGNOSIS — R51 Headache: Secondary | ICD-10-CM

## 2011-08-01 DIAGNOSIS — J069 Acute upper respiratory infection, unspecified: Secondary | ICD-10-CM

## 2011-08-01 DIAGNOSIS — M7541 Impingement syndrome of right shoulder: Secondary | ICD-10-CM | POA: Insufficient documentation

## 2011-08-01 DIAGNOSIS — N281 Cyst of kidney, acquired: Secondary | ICD-10-CM | POA: Insufficient documentation

## 2011-08-01 DIAGNOSIS — K4090 Unilateral inguinal hernia, without obstruction or gangrene, not specified as recurrent: Secondary | ICD-10-CM | POA: Insufficient documentation

## 2011-08-01 DIAGNOSIS — I10 Essential (primary) hypertension: Secondary | ICD-10-CM

## 2011-08-01 NOTE — Patient Instructions (Signed)
Take Sterapred DS 10 mg 6 day dosepak as prescribed. This should help improve nasal congestion and headache. Avoid over-the-counter medications containing decongestant such as Sudafed and phenylephrine. Take Zithromax Z-PAK as directed. Call if not better in 7-10 days.

## 2011-08-01 NOTE — Progress Notes (Signed)
  Subjective:    Patient ID: Chad Gardner, male    DOB: 22-Mar-1966, 46 y.o.   MRN: [id number]  HPI 46 year old black male with history of hypertension treated with losartan, HCTZ, metoprolol. Patient complaining of nasal stuffiness onset February 23. Feels his voice has been hoarse and it he has had difficulty clearing his nose. No discolored nasal drainage. Considerable stuffy nose. He tried taking over-the-counter Claritin-D and did not realize it had a decongestant in it. He should avoid decongestants due to his hypertension. I have given him a list today of acceptable over-the-counter antihistamines to take with his high blood pressure. He also has had some vague headache and the occipital area. He is a poor historian. No visual disturbances. History of glucose intolerance treated with diet control.  Patient had tDap at vaccine February 2010 at  [facility].  History of hematuria evaluated by urology. Was found to have bilateral renal cyst. Family history of prostate cancer in his father who died in his 40s. Mother living possibly with hypertension. 2 brothers one in good health the other one with schizophrenia and hypertension. One sister overweight. Patient has a 81 year old son.   Patient recently got married. Things seem to be going well from that standpoint. He does drink beer and liquor. He has cut down on his consumption considerably over the past year. Does not smoke. Enjoys fishing. 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 Doesn't get much exercise other than his job although he walks occasionally.  History of fractured left wrist at age 60.  Did see Dr. Teressa Senter  December 2011 for painful left wrist diagnosed with chronic stenosing tenosynovitis. Release of first dorsal compartment under local anesthesia was recommended. Also history of right shoulder a.c. joint impingement.    Review of Systems     Objective:   Physical Exam he has very boggy nasal mucosa. Tickly right nostril is  congested. Pharynx only slightly injected. TMs are clear. Neck supple. Chest clear. He is alert and oriented. No scalp tenderness.        Assessment & Plan:  URI  Headache  Hypertension  Plan: Sterapred DS 10 mg 6 day dosepak which should help with nasal congestion a as well his headache. Zithromax Z-PAK take 2 tablets day one followed by 1 tablet days 2 through 5 for respiratory infection. Patient has appointment for followup on hypertension and impaired glucose intolerance in about a month.

## 2011-08-18 ENCOUNTER — Ambulatory Visit (INDEPENDENT_AMBULATORY_CARE_PROVIDER_SITE_OTHER): Payer: BC Managed Care – PPO | Admitting: Internal Medicine

## 2011-08-18 ENCOUNTER — Encounter: Payer: Self-pay | Admitting: Internal Medicine

## 2011-08-18 VITALS — BP 136/82 | Temp 97.2°F | Wt 289.0 lb

## 2011-08-18 DIAGNOSIS — R7301 Impaired fasting glucose: Secondary | ICD-10-CM

## 2011-08-18 DIAGNOSIS — I10 Essential (primary) hypertension: Secondary | ICD-10-CM

## 2011-08-19 LAB — HEMOGLOBIN A1C
Hgb A1c MFr Bld: 5.9 % — ABNORMAL HIGH (ref <5.7)
Mean Plasma Glucose: 123 mg/dL — ABNORMAL HIGH (ref <117)

## 2011-08-22 ENCOUNTER — Telehealth: Payer: Self-pay | Admitting: Internal Medicine

## 2011-08-22 MED ORDER — SILDENAFIL CITRATE 100 MG PO TABS: 100.0000 mg | ORAL_TABLET | Freq: Every day | ORAL | Status: AC | PRN

## 2011-08-22 NOTE — Progress Notes (Signed)
  Subjective:    Patient ID: Chad Gardner, male    DOB: 01-08-1966, 46 y.o.   MRN: [id number]  HPI 46 year old black male Jehovah's Witness in today for followup on hypertension and diabetes mellitus. Hasn't been checking Accu-Cheks on a regular basis. Says he does not have a home Accu-Chek monitor. Prescription given today for    with diabetic test strips and lancets. Basically has been trying to watch his diet. He got married. Wife is working in [place]. He hopes to relocate [place] at some point in the future but right now continues to work at [employer]. They see each other all weekends. She is also a Jehovah's Witness.    Review of Systems     Objective:   Physical Exam neck supple; no carotid bruits; chest clear; cardiac exam regular rate and rhythm; extremities without edema; diabetic foot exam negative        Assessment & Plan:  Diabetes mellitus  Hypertension  Plan: Patient is to check Accu-Cheks before breakfast and before supper 3 times weekly. Return in 6 months.

## 2011-08-23 NOTE — Telephone Encounter (Signed)
Bonita Quin phoned in Viagra for patient to [employer] [facility].  She documentation under Meds.  Pt was advised.

## 2011-08-27 NOTE — Patient Instructions (Signed)
Please get a home glucose monitor and follow up on Accu-Cheks 3 times weekly. Continue antihypertensive medications. Return in 6 months.

## 2011-08-28 ENCOUNTER — Other Ambulatory Visit: Payer: Self-pay | Admitting: Internal Medicine

## 2011-09-12 ENCOUNTER — Other Ambulatory Visit: Payer: Self-pay | Admitting: Internal Medicine

## 2012-02-20 ENCOUNTER — Ambulatory Visit (INDEPENDENT_AMBULATORY_CARE_PROVIDER_SITE_OTHER): Payer: BC Managed Care – PPO | Admitting: Internal Medicine

## 2012-02-20 ENCOUNTER — Other Ambulatory Visit: Payer: BC Managed Care – PPO | Admitting: Internal Medicine

## 2012-02-20 VITALS — BP 132/86 | HR 60 | Temp 97.2°F | Ht 74.0 in | Wt 271.0 lb

## 2012-02-20 DIAGNOSIS — M7660 Achilles tendinitis, unspecified leg: Secondary | ICD-10-CM

## 2012-02-20 DIAGNOSIS — I10 Essential (primary) hypertension: Secondary | ICD-10-CM

## 2012-02-20 DIAGNOSIS — E669 Obesity, unspecified: Secondary | ICD-10-CM

## 2012-02-20 DIAGNOSIS — R7301 Impaired fasting glucose: Secondary | ICD-10-CM

## 2012-02-20 DIAGNOSIS — E119 Type 2 diabetes mellitus without complications: Secondary | ICD-10-CM

## 2012-02-20 LAB — POCT URINALYSIS DIPSTICK
Glucose, UA: NEGATIVE
Leukocytes, UA: NEGATIVE
Nitrite, UA: NEGATIVE
Urobilinogen, UA: NEGATIVE

## 2012-02-20 LAB — CBC WITH DIFFERENTIAL/PLATELET
Eosinophils Absolute: 0.1 10*3/uL (ref 0.0–0.7)
Lymphocytes Relative: 48 % — ABNORMAL HIGH (ref 12–46)
Lymphs Abs: 1.9 10*3/uL (ref 0.7–4.0)
Neutrophils Relative %: 40 % — ABNORMAL LOW (ref 43–77)
Platelets: 218 10*3/uL (ref 150–400)
RBC: 5.47 MIL/uL (ref 4.22–5.81)
WBC: 4.1 10*3/uL (ref 4.0–10.5)

## 2012-02-20 LAB — COMPREHENSIVE METABOLIC PANEL
ALT: 18 U/L (ref 0–53)
CO2: 28 mEq/L (ref 19–32)
Chloride: 103 mEq/L (ref 96–112)
Sodium: 139 mEq/L (ref 135–145)
Total Bilirubin: 0.9 mg/dL (ref 0.3–1.2)
Total Protein: 7.3 g/dL (ref 6.0–8.3)

## 2012-02-20 LAB — HEMOGLOBIN A1C
Hgb A1c MFr Bld: 5.7 % — ABNORMAL HIGH (ref <5.7)
Mean Plasma Glucose: 117 mg/dL — ABNORMAL HIGH (ref <117)

## 2012-02-20 LAB — LIPID PANEL
Cholesterol: 149 mg/dL (ref 0–200)
LDL Cholesterol: 87 mg/dL (ref 0–99)
VLDL: 18 mg/dL (ref 0–40)

## 2012-02-20 LAB — TESTOSTERONE: Testosterone: 418.55 ng/dL (ref 300–890)

## 2012-02-27 ENCOUNTER — Other Ambulatory Visit: Payer: Self-pay | Admitting: Internal Medicine

## 2012-02-28 ENCOUNTER — Other Ambulatory Visit: Payer: Self-pay | Admitting: Internal Medicine

## 2012-05-12 ENCOUNTER — Encounter: Payer: Self-pay | Admitting: Internal Medicine

## 2012-05-12 DIAGNOSIS — E119 Type 2 diabetes mellitus without complications: Secondary | ICD-10-CM | POA: Insufficient documentation

## 2012-05-12 NOTE — Patient Instructions (Addendum)
Continue same medications and return in 6 months for office visit in hemoglobin A 1C. Try the diet exercise and lose weight.

## 2012-05-12 NOTE — Progress Notes (Signed)
  Subjective:    Patient ID: Chad Gardner, male    DOB: Aug 26, 1965, 46 y.o.   MRN: [id number]  HPI 46 year old black male with history of hypertension, bilateral renal cysts, type 2 diabetes mellitus, in today for health maintenance and evaluation of medical problems. He recently married. 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  Has one teenage son. Does not smoke. Does drink beer on weekends.  Family history: Father with history of prostate cancer still living. Mother in her 2s with history of hypertension. 2 brothers. One brother is well. One brother has schizophrenia and hypertension. One sister is overweight.  Evaluated by urologist and 2012 for hematuria. Has small renal cysts bilaterally. CT of abdomen and pelvis showed small fat feel right inguinal hernia. Cystoscopy was normal. He was treated by urologist with antibiotics. Problem has not recurred.  In 2011 patient was seen by Dr. Teressa Senter with a seven-month chronic stenosing tenosynovitis first dorsal compartment left wrist( de Quervain's stenosing tenosynovitis). This was surgically repaired December 2011 by Dr. Teressa Senter.  Diabetes is diet control. Is on 3 drug regimen for hypertension.    Review of Systems  Constitutional: Negative.   HENT: Negative.   Eyes: Negative.   Respiratory: Negative.   Cardiovascular: Negative.   Gastrointestinal: Negative.   Genitourinary: Negative.   Musculoskeletal: Negative.   Hematological: Negative.   Psychiatric/Behavioral: Negative.        Objective:   Physical Exam  Vitals reviewed. Constitutional: He is oriented to person, place, and time. He appears well-nourished. No distress.  HENT:  Head: Normocephalic and atraumatic.  Right Ear: External ear normal.  Left Ear: External ear normal.  Mouth/Throat: Oropharynx is clear and moist.  Eyes: Conjunctivae normal and EOM are normal. Right eye exhibits no discharge. No scleral icterus.  Neck: No JVD  present. No thyromegaly present.  Cardiovascular: Normal rate, regular rhythm, normal heart sounds and intact distal pulses.   No murmur heard. Pulmonary/Chest: Breath sounds normal. He has no wheezes. He has no rales.  Abdominal: Bowel sounds are normal. He exhibits no distension and no mass. There is no tenderness. There is no rebound and no guarding.  Genitourinary: Prostate normal.  Musculoskeletal: He exhibits no edema.  Lymphadenopathy:    He has no cervical adenopathy.  Neurological: He is alert and oriented to person, place, and time. Coordination normal.  Skin: Skin is warm and dry. No rash noted. He is not diaphoretic.  Psychiatric: He has a normal mood and affect. His behavior is normal. Judgment and thought content normal.          Assessment & Plan:  Hypertension  Type 2 diabetes mellitus  Obesity  History of bilateral renal cysts  Plan encouraged diet exercise and weight loss. Return in 6 months for office visit blood pressure check and hemoglobin A1c. Tetanus immunization given in February 2010.

## 2012-05-20 ENCOUNTER — Ambulatory Visit (INDEPENDENT_AMBULATORY_CARE_PROVIDER_SITE_OTHER): Payer: BC Managed Care – PPO | Admitting: Internal Medicine

## 2012-05-20 ENCOUNTER — Encounter: Payer: Self-pay | Admitting: Internal Medicine

## 2012-05-20 VITALS — BP 132/84 | HR 60 | Temp 97.4°F | Wt 273.0 lb

## 2012-05-20 DIAGNOSIS — Z202 Contact with and (suspected) exposure to infections with a predominantly sexual mode of transmission: Secondary | ICD-10-CM

## 2012-05-20 DIAGNOSIS — Z2089 Contact with and (suspected) exposure to other communicable diseases: Secondary | ICD-10-CM

## 2012-05-20 NOTE — Progress Notes (Signed)
  Subjective:    Patient ID: Chad Gardner, male    DOB: 1965/11/10, 46 y.o.   MRN: [id number]  HPI 46 year old black male who says wife of a year was told recently she had Trichomonas vaginalis. He says he has not been sexually active in some 5 years until he met her. This was upsetting to him. She is now on treatment and he needs to be treated as well. He also wants to be checked for other STDs. He recently had an HIV test. He has no symptoms whatsoever. He's been reading about this on the Internet.    Review of Systems     Objective:   Physical Exam No urethral discharge. GC Chlamydia probes taken. Urine specimen checked but no evidence of trichomonads.       Assessment & Plan:  Trichomonas vaginalis in wife-needs treatment as sexual partner  Plan: GC and Chlamydia probes pending. Flagyl 500 mg twice daily for 7 days

## 2012-05-20 NOTE — Patient Instructions (Addendum)
Take Flagyl 500 mg twice daily for 7 days. Avoid alcohol with medication. Take with a meal.

## 2012-05-21 ENCOUNTER — Other Ambulatory Visit: Payer: BC Managed Care – PPO | Admitting: Internal Medicine

## 2012-05-21 DIAGNOSIS — Z202 Contact with and (suspected) exposure to infections with a predominantly sexual mode of transmission: Secondary | ICD-10-CM

## 2012-05-21 LAB — TRICHOMONAS VAGINALIS, PROBE AMP

## 2012-05-22 LAB — TRICHOMONAS VAGINALIS, PROBE AMP: T vaginalis RNA: POSITIVE — AB

## 2012-07-23 ENCOUNTER — Other Ambulatory Visit: Payer: Self-pay | Admitting: Internal Medicine

## 2012-08-22 ENCOUNTER — Other Ambulatory Visit: Payer: BC Managed Care – PPO | Admitting: Internal Medicine

## 2012-08-23 ENCOUNTER — Ambulatory Visit: Payer: BC Managed Care – PPO | Admitting: Internal Medicine

## 2012-09-02 ENCOUNTER — Other Ambulatory Visit: Payer: BC Managed Care – PPO | Admitting: Internal Medicine

## 2012-09-02 DIAGNOSIS — E119 Type 2 diabetes mellitus without complications: Secondary | ICD-10-CM

## 2012-09-02 DIAGNOSIS — Z79899 Other long term (current) drug therapy: Secondary | ICD-10-CM

## 2012-09-02 DIAGNOSIS — E785 Hyperlipidemia, unspecified: Secondary | ICD-10-CM

## 2012-09-02 LAB — LIPID PANEL
Cholesterol: 139 mg/dL (ref 0–200)
HDL: 43 mg/dL (ref >39)
Total CHOL/HDL Ratio: 3.2 Ratio
Triglycerides: 58 mg/dL (ref <150)

## 2012-09-02 LAB — HEPATIC FUNCTION PANEL
ALT: 14 U/L (ref 0–53)
AST: 13 U/L (ref 0–37)
Albumin: 4.2 g/dL (ref 3.5–5.2)

## 2012-09-02 LAB — HEMOGLOBIN A1C: Hgb A1c MFr Bld: 6.1 % — ABNORMAL HIGH (ref <5.7)

## 2012-09-03 ENCOUNTER — Other Ambulatory Visit: Payer: Self-pay | Admitting: Internal Medicine

## 2012-09-05 ENCOUNTER — Ambulatory Visit (INDEPENDENT_AMBULATORY_CARE_PROVIDER_SITE_OTHER): Payer: BC Managed Care – PPO | Admitting: Internal Medicine

## 2012-09-05 ENCOUNTER — Encounter: Payer: Self-pay | Admitting: Internal Medicine

## 2012-09-05 VITALS — BP 136/90 | HR 60 | Wt 271.0 lb

## 2012-09-05 DIAGNOSIS — E119 Type 2 diabetes mellitus without complications: Secondary | ICD-10-CM

## 2012-09-05 DIAGNOSIS — I10 Essential (primary) hypertension: Secondary | ICD-10-CM

## 2012-09-05 DIAGNOSIS — N529 Male erectile dysfunction, unspecified: Secondary | ICD-10-CM

## 2012-09-05 NOTE — Progress Notes (Signed)
  Subjective:    Patient ID: Chad Gardner, male    DOB: 1966-04-25, 47 y.o.   MRN: [id number]  HPI  47 year old Black male with AODM and HTN for 6 month recheck. Had eye exam with Dr. Dione Booze about 6 months ago. Was given bifocals. Only taking  1/2 of  HCTZ tab daily. Needs to take whole tab. Lipid panel is normal.  AIC is 6.1% . DM is diet controlled. Says wife may be getting new job in Control and instrumentation engineer. She has ben living in [place] and he commutes there on weekends so they spend time together. This is somewhat stressful.  Also has history of erectile dysfunction.    Review of Systems     Objective:   Physical Exam   Skin : warm and dry; neck supple without JVD thyromegaly or carotid bruits; chest clear to auscultation without rales or wheezing; cardiac exam: Regular rate and rhythm normal S1 and S2 without murmurs or gallops; extremities without edema; diabetic foot exam: No calluses or ulcers and pulses are within normal limits        Assessment & Plan:  Type 2 diabetes mellitus-controlled  Hypertension  Erectile dysfunction  Plan: Needs to take 25 mg of HCTZ daily instead of half of a 25 mg tablet. Lipid panel is normal. Diabetes is under excellent control. Return in 6 months for physical exam. Cialis samples provided.

## 2012-10-19 DIAGNOSIS — N529 Male erectile dysfunction, unspecified: Secondary | ICD-10-CM | POA: Insufficient documentation

## 2012-10-19 NOTE — Patient Instructions (Addendum)
Take a whole 25 mg  HCTZ 25 mg tablet daily. Continue other medications as previously prescribed and return in 6 months for physical exam.

## 2012-11-20 ENCOUNTER — Other Ambulatory Visit: Payer: Self-pay | Admitting: Internal Medicine

## 2012-12-20 ENCOUNTER — Other Ambulatory Visit: Payer: Self-pay | Admitting: Internal Medicine

## 2012-12-20 ENCOUNTER — Other Ambulatory Visit: Payer: Self-pay

## 2012-12-20 MED ORDER — METOPROLOL SUCCINATE ER 25 MG PO TB24: 25.0000 mg | ORAL_TABLET | Freq: Every day | ORAL | Status: DC

## 2013-02-19 ENCOUNTER — Other Ambulatory Visit: Payer: Self-pay | Admitting: Internal Medicine

## 2013-03-18 ENCOUNTER — Other Ambulatory Visit: Payer: Self-pay | Admitting: Internal Medicine

## 2013-03-18 ENCOUNTER — Other Ambulatory Visit: Payer: BC Managed Care – PPO | Admitting: Internal Medicine

## 2013-03-18 DIAGNOSIS — I10 Essential (primary) hypertension: Secondary | ICD-10-CM

## 2013-03-18 DIAGNOSIS — Z13220 Encounter for screening for lipoid disorders: Secondary | ICD-10-CM

## 2013-03-18 DIAGNOSIS — Z130 Encounter for screening for diseases of the blood and blood-forming organs and certain disorders involving the immune mechanism: Secondary | ICD-10-CM

## 2013-03-18 LAB — CBC WITH DIFFERENTIAL/PLATELET
Basophils Absolute: 0 10*3/uL (ref 0.0–0.1)
Eosinophils Absolute: 0.1 10*3/uL (ref 0.0–0.7)
HCT: 43.2 % (ref 39.0–52.0)
Lymphocytes Relative: 53 % — ABNORMAL HIGH (ref 12–46)
MCH: 29.1 pg (ref 26.0–34.0)
MCV: 82.6 fL (ref 78.0–100.0)
Monocytes Absolute: 0.4 10*3/uL (ref 0.1–1.0)
Neutro Abs: 1.3 10*3/uL — ABNORMAL LOW (ref 1.7–7.7)
RDW: 14.5 % (ref 11.5–15.5)
WBC: 3.7 10*3/uL — ABNORMAL LOW (ref 4.0–10.5)

## 2013-03-19 LAB — COMPREHENSIVE METABOLIC PANEL
AST: 16 U/L (ref 0–37)
Alkaline Phosphatase: 54 U/L (ref 39–117)
BUN: 16 mg/dL (ref 6–23)
Calcium: 9 mg/dL (ref 8.4–10.5)
Creat: 0.87 mg/dL (ref 0.50–1.35)
Glucose, Bld: 116 mg/dL — ABNORMAL HIGH (ref 70–99)

## 2013-03-19 LAB — LIPID PANEL
HDL: 44 mg/dL (ref >39)
LDL Cholesterol: 78 mg/dL (ref 0–99)
Triglycerides: 67 mg/dL (ref <150)

## 2013-03-20 ENCOUNTER — Encounter: Payer: Self-pay | Admitting: Internal Medicine

## 2013-03-20 ENCOUNTER — Ambulatory Visit (INDEPENDENT_AMBULATORY_CARE_PROVIDER_SITE_OTHER): Payer: BC Managed Care – PPO | Admitting: Internal Medicine

## 2013-03-20 VITALS — BP 122/80 | HR 64 | Temp 97.0°F | Ht 73.0 in | Wt 275.0 lb

## 2013-03-20 DIAGNOSIS — N529 Male erectile dysfunction, unspecified: Secondary | ICD-10-CM

## 2013-03-20 DIAGNOSIS — I10 Essential (primary) hypertension: Secondary | ICD-10-CM

## 2013-03-20 DIAGNOSIS — Z0000 Encounter for general adult medical examination without abnormal findings: Secondary | ICD-10-CM

## 2013-03-20 DIAGNOSIS — E669 Obesity, unspecified: Secondary | ICD-10-CM

## 2013-03-20 DIAGNOSIS — E119 Type 2 diabetes mellitus without complications: Secondary | ICD-10-CM

## 2013-03-20 LAB — POCT URINALYSIS DIPSTICK
Bilirubin, UA: NEGATIVE
Glucose, UA: NEGATIVE
Ketones, UA: NEGATIVE
Leukocytes, UA: NEGATIVE

## 2013-03-20 LAB — HEMOGLOBIN A1C: Mean Plasma Glucose: 131 mg/dL — ABNORMAL HIGH (ref <117)

## 2013-03-20 NOTE — Patient Instructions (Signed)
Continue same medications and return in 6 months. Patient declines flu vaccine

## 2013-03-20 NOTE — Progress Notes (Signed)
Subjective:    Patient ID: Chad Gardner, male    DOB: March 13, 1966, 47 y.o.   MRN: [id number]  HPI 47 year old black male with history of hypertension, bilateral renal cysts, type 2 diabetes mellitus in today for health maintenance and evaluation of medical issues.  Feels well. No new complaints.  Social history: He has one teenage son. Does not smoke. Drinks beer on weekends. His wife has left [employer] and is now working in Control and instrumentation engineer area. Unfortunately she's not been able to sell her home and [place]. They hope to move toward the [place] area in the near future. 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  Family history: Father with history of prostate cancer still living. Mother in her 68s with some sort of bladder condition the patient is not sure what the exact condition as. History of hypertension in his mother. 2 brothers. One brother is well and the other brother has schizophrenia and hypertension. One sister is overweight.  Past medical history: Evaluated by urologist in 2012 for hematuria. He has small renal cysts bilaterally. CT of the abdomen and pelvis at that time showed small right inguinal hernia. Cystoscopy was normal. Problem with hematuria has not recurred. In 2011 patient was seen by Dr. Teressa Senter with 7 month history of chronic stenosing tenosynovitis first dorsal compartment left wrist (de Quervain's stenosing tenosynovitis). This was surgically repaired December 2011 by Dr. Teressa Senter. Diabetes is diet controlled. He is on a 3 drug regimen for hypertension.    Saw Dr. Dione Booze 2013 for eye exam and has bifocals.  Review of Systems  Constitutional: Negative.   HENT: Negative.   Eyes:       Wears bifocals  Respiratory: Negative.   Cardiovascular: Negative.   Gastrointestinal: Negative.   Endocrine:       History of glucose intolerance  Allergic/Immunologic: Negative.   Hematological: Negative.        Hx of low WBC   Psychiatric/Behavioral: Negative.        Objective:   Physical Exam  Vitals reviewed. Constitutional: He is oriented to person, place, and time. He appears well-developed and well-nourished. No distress.  HENT:  Head: Normocephalic and atraumatic.  Left Ear: External ear normal.  Mouth/Throat: Oropharynx is clear and moist. No oropharyngeal exudate.  Eyes: Conjunctivae and EOM are normal. Pupils are equal, round, and reactive to light. Right eye exhibits no discharge. Left eye exhibits no discharge. No scleral icterus.  Neck: Neck supple. No JVD present. No thyromegaly present.  Cardiovascular: Normal rate, regular rhythm, normal heart sounds and intact distal pulses.   No murmur heard. Pulmonary/Chest: Effort normal and breath sounds normal. He has no wheezes. He has no rales.  Abdominal: Soft. Bowel sounds are normal. He exhibits no distension and no mass. There is no tenderness. There is no rebound and no guarding.  Genitourinary: Prostate normal.  Prostate slightly boggy, no nodules noted  Musculoskeletal: He exhibits no edema.  Lymphadenopathy:    He has no cervical adenopathy.  Neurological: He is alert and oriented to person, place, and time. He has normal reflexes. He displays normal reflexes. No cranial nerve deficit.  Skin: Skin is warm and dry. No rash noted. He is not diaphoretic.  Psychiatric: He has a normal mood and affect. His behavior is normal. Judgment and thought content normal.          Assessment & Plan:  Type 2 diabetes mellitus-diet controlled.  Hypertension-stable on current regimen  History of bilateral renal  cyst  History of erectile dysfunction-coupon given for 3 tablets of Viagra  Plan: Return in 6 months. He is to check Accu-Cheks 3 times weekly before meals. Apparently he has not understood in the past that he truly was a diabetic.

## 2013-06-16 ENCOUNTER — Other Ambulatory Visit: Payer: Self-pay | Admitting: Internal Medicine

## 2013-07-25 ENCOUNTER — Other Ambulatory Visit: Payer: Self-pay | Admitting: Internal Medicine

## 2013-08-19 ENCOUNTER — Encounter: Payer: Self-pay | Admitting: Internal Medicine

## 2013-08-19 ENCOUNTER — Ambulatory Visit (INDEPENDENT_AMBULATORY_CARE_PROVIDER_SITE_OTHER): Payer: BC Managed Care – PPO | Admitting: Internal Medicine

## 2013-08-19 VITALS — BP 124/90 | HR 68 | Temp 97.8°F | Wt 296.0 lb

## 2013-08-19 DIAGNOSIS — J069 Acute upper respiratory infection, unspecified: Secondary | ICD-10-CM

## 2013-08-19 MED ORDER — LEVOFLOXACIN 500 MG PO TABS: 500.0000 mg | ORAL_TABLET | Freq: Every day | ORAL | Status: DC

## 2013-08-19 MED ORDER — AMOXICILLIN 500 MG PO CAPS: 500.0000 mg | ORAL_CAPSULE | Freq: Three times a day (TID) | ORAL | Status: DC

## 2013-08-19 NOTE — Progress Notes (Signed)
   Subjective:    Patient ID: Corwin LevinsRodney Pagan, male    DOB: Apr 17, 1966, 48 y.o.   MRN: [id number]  HPI In today with URI symptoms that had onset on Thursday, March 12. He's been taking some over-the-counter medication for runny nose. Sounds like he's been taking Coricidin HBP. Doesn't think it's working. No discolored nasal drainage. No fever or shaking chills. Slight cough. Has been out of work yesterday and today due to malaise and fatigue. He is frustrated that he's not getting better quicker.    Review of Systems     Objective:   Physical Exam HEENT exam: Pharynx slightly injected without exudate. TMs are clear. Neck is supple without adenopathy. Chest clear to auscultation.        Assessment & Plan:  Acute URI  Plan: Amoxicillin 500 mg 3 times daily for 10 days. May take Coricidin HBP for congestion. Call if not better in 10 days or sooner if worse. Note given to be out of work.

## 2013-08-19 NOTE — Patient Instructions (Signed)
Take Amoxicillin 500 mg three times daily as directed. Call if not better in 10 days.

## 2013-08-19 NOTE — Addendum Note (Signed)
Addended by: Margaree MackintoshBAXLEY, Yomayra Tate J on: 08/19/2013 12:28 PM   Modules accepted: Orders, Medications

## 2013-08-26 ENCOUNTER — Other Ambulatory Visit: Payer: Self-pay

## 2013-08-26 MED ORDER — LOSARTAN POTASSIUM 100 MG PO TABS: 100.0000 mg | ORAL_TABLET | Freq: Every day | ORAL | Status: DC

## 2013-09-17 ENCOUNTER — Other Ambulatory Visit: Payer: Self-pay | Admitting: Internal Medicine

## 2013-09-23 ENCOUNTER — Encounter: Payer: Self-pay | Admitting: Internal Medicine

## 2013-09-23 ENCOUNTER — Ambulatory Visit (INDEPENDENT_AMBULATORY_CARE_PROVIDER_SITE_OTHER): Payer: BC Managed Care – PPO | Admitting: Internal Medicine

## 2013-09-23 ENCOUNTER — Other Ambulatory Visit: Payer: BC Managed Care – PPO | Admitting: Internal Medicine

## 2013-09-23 VITALS — BP 110/76 | HR 68 | Temp 97.7°F | Wt 290.0 lb

## 2013-09-23 DIAGNOSIS — E119 Type 2 diabetes mellitus without complications: Secondary | ICD-10-CM

## 2013-09-23 DIAGNOSIS — E669 Obesity, unspecified: Secondary | ICD-10-CM

## 2013-09-23 DIAGNOSIS — I10 Essential (primary) hypertension: Secondary | ICD-10-CM

## 2013-09-23 LAB — BASIC METABOLIC PANEL
BUN: 17 mg/dL (ref 6–23)
CHLORIDE: 103 meq/L (ref 96–112)
CO2: 26 meq/L (ref 19–32)
CREATININE: 0.84 mg/dL (ref 0.50–1.35)
Calcium: 9.2 mg/dL (ref 8.4–10.5)
GLUCOSE: 128 mg/dL — AB (ref 70–99)
POTASSIUM: 4 meq/L (ref 3.5–5.3)
Sodium: 138 mEq/L (ref 135–145)

## 2013-09-23 LAB — HEMOGLOBIN A1C
Hgb A1c MFr Bld: 6.7 % — ABNORMAL HIGH (ref <5.7)
Mean Plasma Glucose: 146 mg/dL — ABNORMAL HIGH (ref <117)

## 2013-09-23 NOTE — Patient Instructions (Signed)
Please try to diet and exercise. Lab work is pending as it was drawn today. Return in 6 months. Please have diabetic eye exam.

## 2013-09-23 NOTE — Progress Notes (Signed)
   Subjective:    Patient ID: Corwin LevinsRodney Zbikowski, male    DOB: 1965/08/10, 48 y.o.   MRN: [id number]  HPI He will be 48 years old in July. He's asking about colonoscopy. Have asked him to check with his insurance plan to see what his cost will be and if he has to go to a special endoscopy center. Here today to followup on hypertension and type 2 diabetes mellitus which currently is diet controlled. History of erectile dysfunction. Here today for 6 month followup. He has gained 15 pounds since physical exam in October. He has a history of bilateral renal cyst, history of unilateral inguinal hernia. Had diabetic foot exam October 2014.  Apparently saw Dr. Dione BoozeGroat in 2013 for eye exam and has bifocals. He is to have annual diabetic eye exam.        Review of Systems     Objective:   Physical Exam  Skin: warm and dry. Nodes none. Chest clear to auscultation. Cardiac exam regular rate and rhythm. Extremities without edema. No JVD thyromegaly or carotid bruits.        Assessment & Plan:  Hypertension-blood pressure is quite good today but he has been off work and at home resting. Basic metabolic panel drawn and pending  Type 2 diabetes mellitus which is currently diet controlled-hemoglobin A1c pending  Obesity - has gained 15 pounds since October 2014.  Erectile dysfunction-samples of Viagra given today  Plan: Return in 6 months for physical exam.

## 2013-09-23 NOTE — Addendum Note (Signed)
Addended by: Judy PimpleEILAND, Sondos Wolfman M on: 09/23/2013 05:09 PM   Modules accepted: Orders

## 2013-09-25 ENCOUNTER — Ambulatory Visit: Payer: BC Managed Care – PPO | Admitting: Internal Medicine

## 2013-10-28 ENCOUNTER — Other Ambulatory Visit: Payer: Self-pay

## 2013-10-28 MED ORDER — HYDROCHLOROTHIAZIDE 25 MG PO TABS: 25.0000 mg | ORAL_TABLET | Freq: Every day | ORAL | Status: DC

## 2013-10-28 MED ORDER — METOPROLOL SUCCINATE ER 25 MG PO TB24: 25.0000 mg | ORAL_TABLET | Freq: Every day | ORAL | Status: DC

## 2014-02-25 ENCOUNTER — Other Ambulatory Visit: Payer: Self-pay

## 2014-02-25 MED ORDER — METOPROLOL SUCCINATE ER 25 MG PO TB24: 25.0000 mg | ORAL_TABLET | Freq: Every day | ORAL | Status: DC

## 2014-02-25 MED ORDER — HYDROCHLOROTHIAZIDE 25 MG PO TABS: 25.0000 mg | ORAL_TABLET | Freq: Every day | ORAL | Status: DC

## 2014-02-25 MED ORDER — LOSARTAN POTASSIUM 100 MG PO TABS: 100.0000 mg | ORAL_TABLET | Freq: Every day | ORAL | Status: DC

## 2014-03-02 ENCOUNTER — Encounter: Payer: Self-pay | Admitting: Internal Medicine

## 2014-03-02 ENCOUNTER — Ambulatory Visit (INDEPENDENT_AMBULATORY_CARE_PROVIDER_SITE_OTHER): Payer: BC Managed Care – PPO | Admitting: Internal Medicine

## 2014-03-02 VITALS — BP 118/82 | HR 62 | Temp 97.9°F | Ht 74.0 in | Wt 284.0 lb

## 2014-03-02 DIAGNOSIS — J069 Acute upper respiratory infection, unspecified: Secondary | ICD-10-CM

## 2014-03-02 MED ORDER — AMOXICILLIN 500 MG PO CAPS: 500.0000 mg | ORAL_CAPSULE | Freq: Three times a day (TID) | ORAL | Status: DC

## 2014-03-02 NOTE — Progress Notes (Signed)
   Subjective:    Patient ID: Chad Gardner, male    DOB: Dec 27, 1965, 48 y.o.   MRN: [id number]  HPI  URI symptoms onset Thursday, September 24. Initially just had upper respiratory congestion. Now has noted a little bit of wheezing and deep cough. He is concerned because he is not getting better. Says right chest his been a bit painful when he coughs.    Review of Systems     Objective:   Physical Exam  Pharynx is slightly injected. TMs clear. Neck is supple. Chest clear anteriorly and posteriorly      Assessment & Plan:  Acute URI  Plan: Amoxicillin 500 mg 3 times a day for 10 days. Out of work until Wednesday. Note  provided. Rest and drink plenty of fluids.

## 2014-03-02 NOTE — Patient Instructions (Signed)
Take amoxicillin 500 mg 3 times daily for 10 days. Out of work until Wednesday, September 30

## 2014-03-03 ENCOUNTER — Telehealth: Payer: Self-pay

## 2014-03-03 NOTE — Telephone Encounter (Signed)
Patient called and is not feel much better.  He is afraid he may not be able to go back to work tomorrow.  He is still coughing and having night sweats.  He was wondering if he wasn't feeling better could he get a work not for another day or so?  Please advise.

## 2014-03-03 NOTE — Telephone Encounter (Signed)
Per Dr Lenord FellersBaxley a letter for patient to stay out of work another day or so is fine.  Patient will call tomorrow and let us know if he feels up to going to work.

## 2014-03-27 ENCOUNTER — Other Ambulatory Visit: Payer: BC Managed Care – PPO | Admitting: Internal Medicine

## 2014-03-27 ENCOUNTER — Encounter: Payer: Self-pay | Admitting: Internal Medicine

## 2014-03-27 ENCOUNTER — Ambulatory Visit (INDEPENDENT_AMBULATORY_CARE_PROVIDER_SITE_OTHER): Payer: BC Managed Care – PPO | Admitting: Internal Medicine

## 2014-03-27 VITALS — BP 138/78 | HR 78 | Temp 98.9°F | Ht 73.25 in | Wt 284.0 lb

## 2014-03-27 DIAGNOSIS — N529 Male erectile dysfunction, unspecified: Secondary | ICD-10-CM

## 2014-03-27 DIAGNOSIS — N281 Cyst of kidney, acquired: Secondary | ICD-10-CM

## 2014-03-27 DIAGNOSIS — E119 Type 2 diabetes mellitus without complications: Secondary | ICD-10-CM

## 2014-03-27 DIAGNOSIS — I10 Essential (primary) hypertension: Secondary | ICD-10-CM

## 2014-03-27 DIAGNOSIS — Q6102 Congenital multiple renal cysts: Secondary | ICD-10-CM

## 2014-03-27 DIAGNOSIS — R7301 Impaired fasting glucose: Secondary | ICD-10-CM

## 2014-03-27 DIAGNOSIS — Z0000 Encounter for general adult medical examination without abnormal findings: Secondary | ICD-10-CM

## 2014-03-27 LAB — POCT URINALYSIS DIPSTICK
Bilirubin, UA: NEGATIVE
Glucose, UA: NEGATIVE
KETONES UA: NEGATIVE
Leukocytes, UA: NEGATIVE
Nitrite, UA: NEGATIVE
PH UA: 7
PROTEIN UA: NEGATIVE
RBC UA: NEGATIVE
SPEC GRAV UA: 1.01
Urobilinogen, UA: NEGATIVE

## 2014-03-27 LAB — LIPID PANEL
Cholesterol: 123 mg/dL (ref 0–200)
HDL: 41 mg/dL (ref >39)
LDL Cholesterol: 69 mg/dL (ref 0–99)
Total CHOL/HDL Ratio: 3 Ratio
Triglycerides: 67 mg/dL (ref <150)
VLDL: 13 mg/dL (ref 0–40)

## 2014-03-27 LAB — COMPREHENSIVE METABOLIC PANEL
ALBUMIN: 4.3 g/dL (ref 3.5–5.2)
ALT: 20 U/L (ref 0–53)
AST: 20 U/L (ref 0–37)
Alkaline Phosphatase: 58 U/L (ref 39–117)
BUN: 17 mg/dL (ref 6–23)
CALCIUM: 9.2 mg/dL (ref 8.4–10.5)
CHLORIDE: 102 meq/L (ref 96–112)
CO2: 24 mEq/L (ref 19–32)
CREATININE: 0.86 mg/dL (ref 0.50–1.35)
Glucose, Bld: 110 mg/dL — ABNORMAL HIGH (ref 70–99)
POTASSIUM: 4 meq/L (ref 3.5–5.3)
Sodium: 137 mEq/L (ref 135–145)
Total Bilirubin: 0.6 mg/dL (ref 0.2–1.2)
Total Protein: 6.8 g/dL (ref 6.0–8.3)

## 2014-03-27 LAB — CBC WITH DIFFERENTIAL/PLATELET
Basophils Absolute: 0 10*3/uL (ref 0.0–0.1)
Basophils Relative: 0 % (ref 0–1)
Eosinophils Absolute: 0.1 10*3/uL (ref 0.0–0.7)
Eosinophils Relative: 3 % (ref 0–5)
HEMATOCRIT: 44.6 % (ref 39.0–52.0)
Hemoglobin: 15.3 g/dL (ref 13.0–17.0)
LYMPHS ABS: 1.7 10*3/uL (ref 0.7–4.0)
LYMPHS PCT: 48 % — AB (ref 12–46)
MCH: 28.9 pg (ref 26.0–34.0)
MCHC: 34.3 g/dL (ref 30.0–36.0)
MCV: 84.3 fL (ref 78.0–100.0)
Monocytes Absolute: 0.3 10*3/uL (ref 0.1–1.0)
Monocytes Relative: 8 % (ref 3–12)
Neutro Abs: 1.5 10*3/uL — ABNORMAL LOW (ref 1.7–7.7)
Neutrophils Relative %: 41 % — ABNORMAL LOW (ref 43–77)
PLATELETS: 235 10*3/uL (ref 150–400)
RBC: 5.29 MIL/uL (ref 4.22–5.81)
RDW: 14.3 % (ref 11.5–15.5)
WBC: 3.6 10*3/uL — AB (ref 4.0–10.5)

## 2014-03-27 LAB — HEMOGLOBIN A1C
Hgb A1c MFr Bld: 6.5 % — ABNORMAL HIGH (ref <5.7)
Mean Plasma Glucose: 140 mg/dL — ABNORMAL HIGH (ref <117)

## 2014-03-27 MED ORDER — METOPROLOL SUCCINATE ER 25 MG PO TB24: 25.0000 mg | ORAL_TABLET | Freq: Every day | ORAL | Status: DC

## 2014-03-27 MED ORDER — HYDROCHLOROTHIAZIDE 25 MG PO TABS: 25.0000 mg | ORAL_TABLET | Freq: Every day | ORAL | Status: DC

## 2014-03-27 MED ORDER — LOSARTAN POTASSIUM 100 MG PO TABS: 100.0000 mg | ORAL_TABLET | Freq: Every day | ORAL | Status: DC

## 2014-03-28 LAB — PSA: PSA: 0.38 ng/mL (ref <=4.00)

## 2014-05-26 ENCOUNTER — Ambulatory Visit (INDEPENDENT_AMBULATORY_CARE_PROVIDER_SITE_OTHER): Payer: BC Managed Care – PPO | Admitting: Family Medicine

## 2014-05-26 ENCOUNTER — Ambulatory Visit (INDEPENDENT_AMBULATORY_CARE_PROVIDER_SITE_OTHER): Payer: BC Managed Care – PPO

## 2014-05-26 VITALS — BP 126/74 | HR 72 | Temp 98.8°F | Resp 18 | Ht 75.0 in | Wt 292.0 lb

## 2014-05-26 DIAGNOSIS — R079 Chest pain, unspecified: Secondary | ICD-10-CM

## 2014-05-26 DIAGNOSIS — T1490 Injury, unspecified: Secondary | ICD-10-CM

## 2014-05-26 DIAGNOSIS — T1490XA Injury, unspecified, initial encounter: Secondary | ICD-10-CM

## 2014-05-26 MED ORDER — CYCLOBENZAPRINE HCL 10 MG PO TABS: 10.0000 mg | ORAL_TABLET | Freq: Every day | ORAL | Status: AC

## 2014-05-26 NOTE — Progress Notes (Signed)
    MRN: [id number] DOB: 27-Oct-1965  Subjective:   Chief Complaint  Patient presents with  . Chest Pain    x1 week;chest muscle on the right side    Chad Gardner is a 48 y.o. male presenting for week of right sided chest pain.  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  He states that he had some right sided shoulder pain after strenuous work days but this resolved, and now he has this right sided chest pain, just beneath his breast.  He states that he has no dyspnea, but coughing may bring about the pain as well as a very deep inspiration.  He has been rubbing menthylated ointments and pads which may help the symptoms, but they soon come back.  The pain makes it difficult to sleep if he rolls onto his left side.  He denies any blunt trauma or coughing.   He denies difficulty with urination, hematuria, radiating to groin pain, fever, sob, or n/v.     Thereasa DistanceRodney has a current medication list which includes the following prescription(s): hydrochlorothiazide, losartan, metoprolol succinate, acetaminophen, ginseng, and polycarbophil.  He has No Known Allergies.   Thereasa DistanceRodney  has a past medical history of Hypertension and Tendonitis of wrist, left. Also  has past surgical history that includes Tendon release (2011).  ROS As in subjective.  Objective:   Vitals: BP 126/74 mmHg  Pulse 72  Temp(Src) 98.8 F (37.1 C) (Oral)  Resp 18  Ht 6\' 3"  (1.905 m)  Wt 292 lb (132.45 kg)  BMI 36.50 kg/m2  SpO2 98%  Physical Exam  Constitutional: He is well-developed, well-nourished, and in no distress. No distress.  HENT:  Head: Normocephalic and atraumatic.  Eyes: Pupils are equal, round, and reactive to light.  Cardiovascular: Normal rate, regular rhythm, normal heart sounds and intact distal pulses.  Exam reveals no gallop and no friction rub.   No murmur heard. Pulmonary/Chest: Effort normal and breath sounds normal. No respiratory distress. He  has no wheezes.  Abdominal: Soft. Bowel sounds are normal. There is no tenderness.  Musculoskeletal:  No masses, erythema, or swelling detected along the breast or axillary.  External abduction of the shoulder reproduces the pain beneath the breast.  Pain with lateral deviation to the right side.  No pain with resisted flexion or abduction.  No pain with upper extremity pushing-off movement.   No pain with lateral rotation.       Skin: Skin is warm, dry and intact. No bruising, no ecchymosis and no rash noted. He is not diaphoretic.   [facility] reading (PRIMARY) by  Dr. Patsy Lageropland. Negative  Assessment and Plan :  48 year old male with PMH listed above is here for 7 days of right-sided chest pain.   More suspicious of a costochondral tear that is deep with in the musculature.  I have prescribed muscle relaxants and advised the patient to take ibuprofen.  He will return within 14 days if the symptoms do not improve, however tears and strains may take weeks to month to heal.  Chest pain, unspecified chest pain type - Plan: DG Chest 2 View  Muscle injury - Plan: cyclobenzaprine (FLEXERIL) 10 MG tablet   Trena PlattStephanie English, PA-C [facility] 12/22/20158:52 PM

## 2014-05-26 NOTE — Patient Instructions (Signed)
Contact us if you have no improvement within the next 14 days.  Muscle injury can take weeks to months to heal.  You may take ibuprofen for pain.

## 2014-06-06 ENCOUNTER — Telehealth: Payer: Self-pay

## 2014-06-06 NOTE — Telephone Encounter (Addendum)
Pt was seen for chest muscle tare/strain.  He says the pharmacy has requested a refill on his medication but that he has heard nothing from [place].  He also says that he is unable to return to work yet due to the pain and will need a note for his job.  [phone]

## 2014-06-07 ENCOUNTER — Telehealth: Payer: Self-pay

## 2014-06-07 NOTE — Telephone Encounter (Signed)
Patient is calling back to request muscle relaxer medication and excuse for work absence note. Patient would also like to know if he should continue care with his pcp or continue his follow up for visit here. Please call! [phone]

## 2014-06-08 ENCOUNTER — Encounter: Payer: Self-pay | Admitting: Internal Medicine

## 2014-06-08 ENCOUNTER — Ambulatory Visit (INDEPENDENT_AMBULATORY_CARE_PROVIDER_SITE_OTHER): Payer: BC Managed Care – PPO | Admitting: Internal Medicine

## 2014-06-08 VITALS — BP 132/86 | HR 70 | Temp 98.2°F | Wt 291.5 lb

## 2014-06-08 DIAGNOSIS — R350 Frequency of micturition: Secondary | ICD-10-CM

## 2014-06-08 LAB — POCT URINALYSIS DIPSTICK
Bilirubin, UA: NEGATIVE
Blood, UA: NEGATIVE
Glucose, UA: NEGATIVE
KETONES UA: NEGATIVE
LEUKOCYTES UA: NEGATIVE
NITRITE UA: NEGATIVE
PROTEIN UA: NEGATIVE
SPEC GRAV UA: 1.02
Urobilinogen, UA: NEGATIVE
pH, UA: 6

## 2014-06-08 MED ORDER — MELOXICAM 15 MG PO TABS
15.0000 mg | ORAL_TABLET | Freq: Every day | ORAL | Status: DC
Start: 2014-06-08 — End: 2014-10-23

## 2014-06-08 NOTE — Telephone Encounter (Signed)
Pt has already followed up with his PCP. Pt was given OOW and muscle relaxer.

## 2014-06-08 NOTE — Telephone Encounter (Signed)
Duplicate

## 2014-06-08 NOTE — Progress Notes (Signed)
   Subjective:    Patient ID: Chad Gardner, Chad Gardner: 15-Aug-1965, 49 y.o.   MRN: [id number]  HPI Patient is in today mainly because of some chest wall pain which [facility]. Patient thinks he injured his right lateral chest wall doing some electrical work at job. Chest x-ray was negative. He is concerned about his heart. Continues to have some chest pain  right lateral chest wall. Is concerned he's not better.  Also, wife has been diagnosed with HPV. Patient says she had no previous sexual partners prior to him but he had several. He has no genital warts.  Also says he's due for colonoscopy. He previously has been seen by Dr. Loreta Ave and received notice from her. He wants to make sure this is covered by his insurance. 48 would be the correct age for screening colonoscopy.    Review of Systems     Objective:   Physical Exam Neck is supple without JVD thyromegaly or carotid bruits. Chest clear to auscultation. Cardiac exam regular rate and rhythm. Extremities without edema. Has palpable right lateral chest wall tenderness in the axillary line. EKG shows no changes from previous EKG 2011       Assessment & Plan:  Right chest wall pain-patient reassured. This is not cardiac pain. Treat with  Mobic 15 mg daily for 30 days.  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 Note was provided. Says he needs time to recover from the chest wall pain. He was reassured regarding the HPV. There is no direct harm to him other than development of genital warts. Currently he has no warts.  Health maintenance-due for screening colonoscopy since he is now 49 years old. He will call Dr. Loreta Ave for an appointment.

## 2014-06-08 NOTE — Patient Instructions (Addendum)
Take Mobic 15 mg daily. Apply ice to chest wall area 20 minutes daily. Out of work this week at patient request. Have colonoscopy in the near future. Reassure regarding HPV exposure.

## 2014-06-09 ENCOUNTER — Encounter: Payer: Self-pay | Admitting: Internal Medicine

## 2014-06-09 NOTE — Progress Notes (Signed)
Subjective:    Patient ID: Chad Gardner, male    DOB: 07-18-1965, 49 y.o.   MRN: [id number]  HPI 49 year old Black male in today for health maintenance and evaluation of medical issues including hypertension and diabetes. He has a history of bilateral renal cysts. He was reassured that these are not an issue.  Past medical history: Evaluated by urologist for hematuria. He has small renal cysts bilaterally. CT of abdomen and pelvis at that time showed small right inguinal hernia. Cystoscopy was normal. Problem with hematuria has not recurred.  In 2011 patient was seen by Dr. 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 This was surgically repaired December 2011.  Diabetes is diet controlled.  He is on a 3 drug regimen for hypertension  Social history: He has 1 teenage son. Does not smoke. Drinks per on weekends. Wife now working in [place]. He Is job is no [employer] and at [employer]. I thought about moving to [place] in the near future but haven't been able to do that financially.  Family history: Father with history of prostate cancer still living. Patient says his mother is living in her 3970s with some sort of bladder condition. He's not sure exactly what the condition is. History of hypertension in his mother. 2 brothers. One brother is well and the other brother has schizophrenia and hypertension. One sister overweight.  Ophthalmologist is Dr. Dione BoozeGroat. Patient has bifocals. Reminded about annual diabetic eye exam.    Review of Systems  Constitutional: Negative.   Genitourinary:       Erectile dysfunction  All other systems reviewed and are negative.      Objective:   Physical Exam  Constitutional: He is oriented to person, place, and time. He appears well-developed and well-nourished. No distress.  HENT:  Head: Normocephalic and  atraumatic.  Right Ear: External ear normal.  Left Ear: External ear normal.  Mouth/Throat: Oropharynx is clear and moist. No oropharyngeal exudate.  Eyes: Conjunctivae are normal. Pupils are equal, round, and reactive to light. Right eye exhibits no discharge. Left eye exhibits no discharge. No scleral icterus.  Neck: Neck supple. No JVD present. No thyromegaly present.  Cardiovascular: Normal rate, regular rhythm, normal heart sounds and intact distal pulses.   No murmur heard. Pulmonary/Chest: Effort normal and breath sounds normal. No respiratory distress. He has no wheezes. He has no rales. He exhibits no tenderness.  Abdominal: Soft. Bowel sounds are normal. He exhibits no distension and no mass. There is no tenderness. There is no rebound and no guarding.  Genitourinary: Prostate normal.  Musculoskeletal: Normal range of motion. He exhibits no edema.  Lymphadenopathy:    He has no cervical adenopathy.  Neurological: He is alert and oriented to person, place, and time. He has normal reflexes. No cranial nerve deficit. Coordination normal.  Skin: Skin is warm and dry. No rash noted. He is not diaphoretic.  Psychiatric: He has a normal mood and affect. His behavior is normal. Thought content normal.  Vitals reviewed.         Assessment & Plan:  Hypertension-stable on current regimen. No changes  Type 2 diabetes-control. No change in regimen. Hemoglobin A1c stable at 6.5%.  History of bilateral renal cysts  Erectile dysfunction-coupon given for Cialis  Plan: Return in 6 months or as needed. Urine for microalbumin next visit. Colonoscopy per Dr. Loreta AveMann. Patient had TD happened 2010. Discuss Pneumovax immunization with  him at next office visit. Previously he has declined it.

## 2014-06-09 NOTE — Patient Instructions (Addendum)
Coupons given for medication for erectile dysfunction. Continue same medications and return in 6 months. Reminded about annual diabetic eye exam. Colonoscopy per Dr. Loreta AveMann. Has seen her before.

## 2014-06-10 ENCOUNTER — Telehealth: Payer: Self-pay | Admitting: *Deleted

## 2014-06-10 LAB — HM DIABETES EYE EXAM

## 2014-06-10 NOTE — Telephone Encounter (Signed)
Patient sent over message stating he needed referral for his colonoscopy. Called Dr Kenna GilbertMann's office they stated they have taken care of arrangements and pt is scheduled for procedure on 07/31/2014.

## 2014-06-16 ENCOUNTER — Encounter: Payer: Self-pay | Admitting: Internal Medicine

## 2014-06-19 ENCOUNTER — Encounter: Payer: Self-pay | Admitting: Internal Medicine

## 2014-06-24 ENCOUNTER — Other Ambulatory Visit: Payer: Self-pay | Admitting: *Deleted

## 2014-06-24 MED ORDER — METOPROLOL SUCCINATE ER 25 MG PO TB24: 25.0000 mg | ORAL_TABLET | Freq: Every day | ORAL | Status: DC

## 2014-06-24 NOTE — Telephone Encounter (Signed)
Patient called states his Insurance wont pay for 90 day supply of his Metoprolol and he is out of his medication resent script for 30 day supply with 3 refills

## 2014-07-13 ENCOUNTER — Other Ambulatory Visit: Payer: Self-pay | Admitting: *Deleted

## 2014-07-13 NOTE — Telephone Encounter (Signed)
Patient called for refill on metoprolol we had recently sent in script I spoke with his pharmacy they have it on file

## 2014-08-14 ENCOUNTER — Encounter: Payer: Self-pay | Admitting: Internal Medicine

## 2014-10-02 ENCOUNTER — Encounter: Payer: Self-pay | Admitting: Internal Medicine

## 2014-10-02 ENCOUNTER — Ambulatory Visit (INDEPENDENT_AMBULATORY_CARE_PROVIDER_SITE_OTHER): Payer: BC Managed Care – PPO | Admitting: Internal Medicine

## 2014-10-02 VITALS — BP 136/84 | HR 68 | Temp 97.8°F | Wt 286.0 lb

## 2014-10-02 DIAGNOSIS — E119 Type 2 diabetes mellitus without complications: Secondary | ICD-10-CM

## 2014-10-02 LAB — HEMOGLOBIN A1C
Hgb A1c MFr Bld: 8.3 % — ABNORMAL HIGH (ref <5.7)
Mean Plasma Glucose: 192 mg/dL — ABNORMAL HIGH (ref <117)

## 2014-10-02 NOTE — Progress Notes (Signed)
   Subjective:    Patient ID: Chad LevinsRodney Gardner, male    DOB: 11/11/1965, 49 y.o.   MRN: [id number]  HPI 49 year old Black Male in today for six-month recheck on hypertension and type 2 diabetes mellitus. Hasn't been checking Accu-Cheks on a regular basis. He is having some stress in his marriage. He talked about it quite a bit today. Blood pressure was initially elevated when he arrived but it came down after talking and resting a bit. Hemoglobin A1c drawn and pending. History of erectile dysfunction. Admits he's probably been drinking a little too much alcohol recently due to situational stress    Review of Systems     Objective:   Physical Exam  Skin warm and dry. Legs are dry. Have recommended Eucerin cream for legs. Neck is supple without JVD thyromegaly or carotid bruits. Chest clear to auscultation. Cardiac exam regular rate and rhythm normal S1 and S2.  Discussed with him the need for pneumococcal immunizations. He has declined.      Assessment & Plan:  Type 2 diabetes mellitus-needs to check Accu-Cheks on a more regular basis and watch his diet  Obesity-encouraged diet and exercise  Essential hypertension-stable  Situational stress-discussed at length  Plan: Depending on hemoglobin A1c, return in 6 months for physical examination. He declines pneumococcal vaccine. Recommend checking Accu-Cheks on a more regular basis. Watch alcohol intake.  25 minutes spent with patient

## 2014-10-02 NOTE — Patient Instructions (Signed)
Encouraged diet exercise and weight loss. Take Accu-Cheks on a more regular basis. Watch alcohol intake. Try to lose weight. Return in 6 months.

## 2014-10-03 LAB — MICROALBUMIN / CREATININE URINE RATIO
Creatinine, Urine: 153.6 mg/dL
Microalb Creat Ratio: 3.3 mg/g (ref 0.0–30.0)
Microalb, Ur: 0.5 mg/dL (ref <2.0)

## 2014-10-05 ENCOUNTER — Telehealth: Payer: Self-pay | Admitting: *Deleted

## 2014-10-05 NOTE — Telephone Encounter (Signed)
Left message on patient voice mail with lab results and instructions to call and schedule appt.

## 2014-10-07 ENCOUNTER — Other Ambulatory Visit: Payer: Self-pay | Admitting: Internal Medicine

## 2014-10-09 ENCOUNTER — Telehealth: Payer: Self-pay | Admitting: Internal Medicine

## 2014-10-09 NOTE — Telephone Encounter (Signed)
Patient called requesting a new glucometer be called in to [facility] [place].  He called requesting new test strips yesterday.  They were called in for him.  When he returned home with the test strips, he realized that his glucometer was out of date and too old according to the test strips and the pharmacist.  So, [facility]) needed to request new Rx for glucometer.    [facility] [address]

## 2014-10-09 NOTE — Telephone Encounter (Signed)
Spoke with the pharmacist.  Called in new Glucometer:  Accu Check Aviva Plus Patient already had new test strips from being called in on 10/08/14 by Rosalita ChessmanSuzanne.

## 2014-10-15 DIAGNOSIS — Z029 Encounter for administrative examinations, unspecified: Secondary | ICD-10-CM

## 2014-10-23 ENCOUNTER — Encounter: Payer: Self-pay | Admitting: Internal Medicine

## 2014-10-23 ENCOUNTER — Ambulatory Visit (INDEPENDENT_AMBULATORY_CARE_PROVIDER_SITE_OTHER): Payer: BC Managed Care – PPO | Admitting: Internal Medicine

## 2014-10-23 VITALS — BP 108/70 | HR 68 | Temp 98.0°F

## 2014-10-23 DIAGNOSIS — F439 Reaction to severe stress, unspecified: Secondary | ICD-10-CM

## 2014-10-23 DIAGNOSIS — I10 Essential (primary) hypertension: Secondary | ICD-10-CM

## 2014-10-23 DIAGNOSIS — Z658 Other specified problems related to psychosocial circumstances: Secondary | ICD-10-CM | POA: Diagnosis not present

## 2014-10-23 DIAGNOSIS — E119 Type 2 diabetes mellitus without complications: Secondary | ICD-10-CM | POA: Diagnosis not present

## 2014-10-23 MED ORDER — GLIPIZIDE ER 2.5 MG PO TB24: 2.5000 mg | ORAL_TABLET | Freq: Every day | ORAL | Status: DC

## 2014-10-23 MED ORDER — TRIAMCINOLONE ACETONIDE 0.1 % EX CREA: 1.0000 "application " | TOPICAL_CREAM | Freq: Two times a day (BID) | CUTANEOUS | Status: DC

## 2014-10-23 MED ORDER — HYDROCORTISONE 2.5 % RE CREA: 1.0000 "application " | TOPICAL_CREAM | Freq: Two times a day (BID) | RECTAL | Status: DC

## 2014-10-23 NOTE — Progress Notes (Signed)
   Subjective:    Patient ID: Chad LevinsRodney Gardner, male    DOB: 03-Dec-1965, 49 y.o.   MRN: [id number]  HPI  49 year old Black Male  for follow up of HTN and elevated glucose.  Brings in multiple accucheck readings ranging from 127 to 225. Says he has not been drinking alcohol and trying to watch his diet. He went out to eat at [employer], ate bread, and the next morning Accu-Chek was 225. Blood pressure today is under excellent control. It was elevated last visit. Still having some issues with his wife. Getting counseling through his church. Review of Systems-complaining of rash on neck and anterior chest. Also sometimes has rectal irritation after a bowel movement.      Objective:   Physical Exam  Skin warm and dry. Fine papular rash on anterior chest with slight erythema. Chest clear. Cardiac exam regular rate and rhythm. Extremities without edema.       Assessment & Plan:   Controlled type 2 diabetes mellitus. At last visit hemoglobin A1c had increased from 6.5-8.3%. He is going to start glipizide XL 2.5 mg daily every morning and return in 6 weeks for office visit and repeat hemoglobin A1c on same day.  Hypertension-stable on current regimen  Probable contact dermatitis anterior chest-prescribed triamcinolone cream 0.1% to use sparingly twice daily  Rectal irritation-prescribed ProctoCream-HC to use in rectal area 2 times daily as needed.  25 minutes spent with patient discussing situational stress issues, control of diabetes mellitus, medication side effects and expectations for diet and exercise.

## 2014-10-23 NOTE — Patient Instructions (Signed)
Take Glipizide 2.5 mg before breakfast.  Return in 6 weeks. Keep accuchecks before breakfast and supper.

## 2014-12-11 ENCOUNTER — Encounter: Payer: Self-pay | Admitting: Internal Medicine

## 2014-12-11 ENCOUNTER — Ambulatory Visit (INDEPENDENT_AMBULATORY_CARE_PROVIDER_SITE_OTHER): Payer: BC Managed Care – PPO | Admitting: Internal Medicine

## 2014-12-11 VITALS — BP 112/80 | HR 75 | Temp 98.2°F | Wt 278.0 lb

## 2014-12-11 DIAGNOSIS — I10 Essential (primary) hypertension: Secondary | ICD-10-CM | POA: Diagnosis not present

## 2014-12-11 DIAGNOSIS — E119 Type 2 diabetes mellitus without complications: Secondary | ICD-10-CM | POA: Diagnosis not present

## 2014-12-11 DIAGNOSIS — Z658 Other specified problems related to psychosocial circumstances: Secondary | ICD-10-CM | POA: Diagnosis not present

## 2014-12-11 DIAGNOSIS — F439 Reaction to severe stress, unspecified: Secondary | ICD-10-CM

## 2014-12-11 LAB — HEMOGLOBIN A1C
Hgb A1c MFr Bld: 7.3 % — ABNORMAL HIGH (ref <5.7)
Mean Plasma Glucose: 163 mg/dL — ABNORMAL HIGH (ref <117)

## 2014-12-14 ENCOUNTER — Telehealth: Payer: Self-pay | Admitting: *Deleted

## 2014-12-14 NOTE — Telephone Encounter (Signed)
Left results of HgbA1c on patient voice mail

## 2015-01-03 ENCOUNTER — Encounter: Payer: Self-pay | Admitting: Internal Medicine

## 2015-01-03 NOTE — Progress Notes (Signed)
   Subjective:    Patient ID: Chad Gardner, male    DOB: 1965-09-03, 49 y.o.   MRN: [id number]  HPI   In today to follow-up on hypertension and diabetes. Since last visit he's cut down on alcohol consumption. He is really not had any alcohol at all. He and his wife have separated. He talked about this it length. Hemoglobin A1c has improved from 8.3% 3 months ago to 7.3%.   Review of Systems     Objective:   Physical Exam  That 15 minutes talking with patient about his marital situation. Talked about diet exercise and weight loss efforts.      Assessment & Plan:  Controlled type 2 diabetes without complication  Essential hypertension  Situational stress with recent separation  Plan: It indeed helped him to stop alcohol consumption. His hemoglobin A1c has improved. His blood pressure is excellent. Asked him to refrain from alcohol. Continue diet and exercise efforts and return in November for physical exam.

## 2015-01-03 NOTE — Patient Instructions (Signed)
Continue same medications. Continue diet exercise. Avoid alcohol. Return in November for physical examination

## 2015-01-18 ENCOUNTER — Other Ambulatory Visit: Payer: Self-pay | Admitting: Internal Medicine

## 2015-01-18 NOTE — Telephone Encounter (Signed)
Glipizide refilled

## 2015-03-02 ENCOUNTER — Encounter: Payer: Self-pay | Admitting: Internal Medicine

## 2015-03-02 ENCOUNTER — Ambulatory Visit (INDEPENDENT_AMBULATORY_CARE_PROVIDER_SITE_OTHER): Payer: BC Managed Care – PPO | Admitting: Internal Medicine

## 2015-03-02 VITALS — BP 110/74 | HR 64 | Temp 98.1°F | Wt 266.0 lb

## 2015-03-02 DIAGNOSIS — M545 Low back pain: Secondary | ICD-10-CM | POA: Diagnosis not present

## 2015-03-02 DIAGNOSIS — Z113 Encounter for screening for infections with a predominantly sexual mode of transmission: Secondary | ICD-10-CM

## 2015-03-02 DIAGNOSIS — R350 Frequency of micturition: Secondary | ICD-10-CM | POA: Diagnosis not present

## 2015-03-02 DIAGNOSIS — M5416 Radiculopathy, lumbar region: Secondary | ICD-10-CM

## 2015-03-02 DIAGNOSIS — M5417 Radiculopathy, lumbosacral region: Secondary | ICD-10-CM

## 2015-03-02 MED ORDER — CYCLOBENZAPRINE HCL 10 MG PO TABS: 10.0000 mg | ORAL_TABLET | Freq: Every day | ORAL | Status: DC

## 2015-03-02 MED ORDER — PREDNISONE 10 MG PO TABS: ORAL_TABLET | ORAL | Status: DC

## 2015-03-03 LAB — GC/CHLAMYDIA PROBE AMP, URINE
CHLAMYDIA, SWAB/URINE, PCR: NEGATIVE
GC Probe Amp, Urine: NEGATIVE

## 2015-03-03 NOTE — Patient Instructions (Addendum)
Take prednisone as directed. Take Flexeril at night for muscle spasm. Follow-up in one week.

## 2015-03-03 NOTE — Progress Notes (Signed)
   Subjective:    Patient ID: Chad Gardner, male    DOB: [birth year], 49 y.o.   MRN: [id number]  HPI Patient went to visit relatives for couple of weeks. He took his tools and did a lot of repair work around their house. At work he has to climb some ladders to do repairs. Subsequently has developed some right low back pain and some numbness in his right foot. This has been very concerning to him. He also has some concerns about his urine. He and his wife are separated. He is not sexually active at present time. Worried about his kidneys with back pain. Urinalysis is within normal limits. GC and chlamydia urine specimen sent.  At one point thought he has some weakness in his right foot this past weekend. This was very frightening for him.    Review of Systems     Objective:   Physical Exam Deep tendon reflexes are 1+ and symmetrical in the knees. His muscle strength is normal in the lower extremities bilaterally. Slight pain with straight leg raising on the right. Sensation is intact in right foot Dipstick urinalysis is normal      Assessment & Plan:  L5-S1 radiculopathy  Screen for sexually transmitted diseases  Urinary frequency  Plan: Sterapred DS 10 mg 6 day dosepak. Flexeril 10 mg at bedtime for muscle spasm.

## 2015-03-09 ENCOUNTER — Ambulatory Visit (INDEPENDENT_AMBULATORY_CARE_PROVIDER_SITE_OTHER): Payer: BC Managed Care – PPO | Admitting: Internal Medicine

## 2015-03-09 ENCOUNTER — Encounter: Payer: Self-pay | Admitting: Internal Medicine

## 2015-03-09 VITALS — BP 118/84 | HR 75 | Temp 98.1°F | Ht 75.0 in | Wt 266.0 lb

## 2015-03-09 DIAGNOSIS — M5441 Lumbago with sciatica, right side: Secondary | ICD-10-CM

## 2015-03-09 MED ORDER — MELOXICAM 15 MG PO TABS: 15.0000 mg | ORAL_TABLET | Freq: Every day | ORAL | Status: DC

## 2015-03-09 NOTE — Patient Instructions (Addendum)
Take Mobic 15 mg daily and continue muscle relaxant at bedtime. Call with progress report in 2 weeks. Apply moist heat for 20 minutes once or twice daily to back

## 2015-03-09 NOTE — Progress Notes (Signed)
   Subjective:    Patient ID: Chad Gardner, male    DOB: 27-Dec-1965, 49 y.o.   MRN: [id number]  HPI Patient returns today to follow-up on back pain and possible radiculopathy. At last visit he indicated his right foot had been weak and numb at some point. Apparently this is gotten better after course of steroids. He's taken muscle relaxant at night. Has pain when getting up out of a chair in his lower back but no radiculopathy at the present time.  Still worried about separation from his wife. He is religious. Talked about this at length.    Review of Systems     Objective:   Physical Exam  Straight leg raising is negative at 90 on the right and muscle strength is normal      Assessment & Plan:  Resolving low back pain  Plan: Try Mobic 15 mg daily up to 30 days. Continue muscle relaxant at bedtime. Moist heat to back is needed. Patient to call with progress report in 2 weeks.

## 2015-03-14 ENCOUNTER — Other Ambulatory Visit: Payer: Self-pay | Admitting: Internal Medicine

## 2015-03-17 ENCOUNTER — Telehealth: Payer: Self-pay | Admitting: Internal Medicine

## 2015-03-17 NOTE — Telephone Encounter (Signed)
He needs to go to physical therapy for 2-3 weeks. His insurance will not likely cover an MRI until he has been to therapy especially with pain switching sides. He can have the rest of the week off work but it is best to stay moving and take his meds. Order PT for him.

## 2015-03-17 NOTE — Telephone Encounter (Signed)
Spoke with patient he is willing to try physical therapy. [facility]. Patient states he will finish work today and see how he feels in the morning he will call us back if he needs note for work

## 2015-03-17 NOTE — Telephone Encounter (Signed)
Patient calls stating that his back had been doing fair.  Yesterday something changed and the pain switched sides and the pain shot down the left side.  He is having terrible pain today in the left side of his back/leg.  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  Says he can put his hand on the place on his back and when he puts his foot down it shoots up his leg.  He is beginning to wonder if walking is making it worse?  He describes his pain at the present time on a scale of 1-10 as an 8.  Is thinking that he may need to go home.    Thinks he may need some time off of work to give his back a break.  And wants to know if you think he needs some sort of testing?    Please advise.

## 2015-04-01 ENCOUNTER — Other Ambulatory Visit: Payer: Self-pay | Admitting: Internal Medicine

## 2015-04-09 ENCOUNTER — Ambulatory Visit (INDEPENDENT_AMBULATORY_CARE_PROVIDER_SITE_OTHER): Payer: BC Managed Care – PPO | Admitting: Internal Medicine

## 2015-04-09 ENCOUNTER — Other Ambulatory Visit: Payer: BC Managed Care – PPO | Admitting: Internal Medicine

## 2015-04-09 ENCOUNTER — Encounter: Payer: Self-pay | Admitting: Internal Medicine

## 2015-04-09 VITALS — BP 124/84 | HR 76 | Temp 97.1°F | Resp 18 | Ht 75.0 in | Wt 281.0 lb

## 2015-04-09 DIAGNOSIS — N529 Male erectile dysfunction, unspecified: Secondary | ICD-10-CM | POA: Diagnosis not present

## 2015-04-09 DIAGNOSIS — N281 Cyst of kidney, acquired: Secondary | ICD-10-CM

## 2015-04-09 DIAGNOSIS — L03011 Cellulitis of right finger: Secondary | ICD-10-CM | POA: Diagnosis not present

## 2015-04-09 DIAGNOSIS — M5416 Radiculopathy, lumbar region: Secondary | ICD-10-CM | POA: Diagnosis not present

## 2015-04-09 DIAGNOSIS — I10 Essential (primary) hypertension: Secondary | ICD-10-CM

## 2015-04-09 DIAGNOSIS — E119 Type 2 diabetes mellitus without complications: Secondary | ICD-10-CM

## 2015-04-09 DIAGNOSIS — Q6102 Congenital multiple renal cysts: Secondary | ICD-10-CM

## 2015-04-09 DIAGNOSIS — F439 Reaction to severe stress, unspecified: Secondary | ICD-10-CM

## 2015-04-09 DIAGNOSIS — Z0000 Encounter for general adult medical examination without abnormal findings: Secondary | ICD-10-CM

## 2015-04-09 DIAGNOSIS — Z658 Other specified problems related to psychosocial circumstances: Secondary | ICD-10-CM

## 2015-04-09 LAB — COMPLETE METABOLIC PANEL WITH GFR
ALBUMIN: 4 g/dL (ref 3.6–5.1)
ALK PHOS: 61 U/L (ref 40–115)
ALT: 17 U/L (ref 9–46)
AST: 17 U/L (ref 10–35)
BILIRUBIN TOTAL: 0.5 mg/dL (ref 0.2–1.2)
BUN: 20 mg/dL (ref 7–25)
CO2: 25 mmol/L (ref 20–31)
CREATININE: 0.8 mg/dL (ref 0.70–1.33)
Calcium: 8.7 mg/dL (ref 8.6–10.3)
Chloride: 103 mmol/L (ref 98–110)
GLUCOSE: 105 mg/dL — AB (ref 65–99)
Potassium: 4.1 mmol/L (ref 3.5–5.3)
SODIUM: 136 mmol/L (ref 135–146)
TOTAL PROTEIN: 6.6 g/dL (ref 6.1–8.1)

## 2015-04-09 LAB — CBC WITH DIFFERENTIAL/PLATELET
BASOS PCT: 0 % (ref 0–1)
Basophils Absolute: 0 10*3/uL (ref 0.0–0.1)
EOS ABS: 0.1 10*3/uL (ref 0.0–0.7)
EOS PCT: 2 % (ref 0–5)
HCT: 45.4 % (ref 39.0–52.0)
Hemoglobin: 15.3 g/dL (ref 13.0–17.0)
Lymphocytes Relative: 48 % — ABNORMAL HIGH (ref 12–46)
Lymphs Abs: 2 10*3/uL (ref 0.7–4.0)
MCH: 28.4 pg (ref 26.0–34.0)
MCHC: 33.7 g/dL (ref 30.0–36.0)
MCV: 84.4 fL (ref 78.0–100.0)
MONO ABS: 0.4 10*3/uL (ref 0.1–1.0)
MONOS PCT: 10 % (ref 3–12)
MPV: 8.8 fL (ref 8.6–12.4)
NEUTROS PCT: 40 % — AB (ref 43–77)
Neutro Abs: 1.6 10*3/uL — ABNORMAL LOW (ref 1.7–7.7)
PLATELETS: 206 10*3/uL (ref 150–400)
RBC: 5.38 MIL/uL (ref 4.22–5.81)
RDW: 13.7 % (ref 11.5–15.5)
WBC: 4.1 10*3/uL (ref 4.0–10.5)

## 2015-04-09 LAB — POCT URINALYSIS DIPSTICK
Bilirubin, UA: NEGATIVE
Glucose, UA: 500
KETONES UA: NEGATIVE
Leukocytes, UA: NEGATIVE
Nitrite, UA: NEGATIVE
RBC UA: NEGATIVE
Spec Grav, UA: 1.025
UROBILINOGEN UA: 0.2
pH, UA: 6

## 2015-04-09 LAB — LIPID PANEL
CHOLESTEROL: 134 mg/dL (ref 125–200)
HDL: 43 mg/dL (ref >=40)
LDL Cholesterol: 81 mg/dL (ref <130)
Total CHOL/HDL Ratio: 3.1 Ratio (ref <=5.0)
Triglycerides: 52 mg/dL (ref <150)
VLDL: 10 mg/dL (ref <30)

## 2015-04-09 LAB — HEMOGLOBIN A1C
HEMOGLOBIN A1C: 6.3 % — AB (ref <5.7)
MEAN PLASMA GLUCOSE: 134 mg/dL — AB (ref <117)

## 2015-04-09 MED ORDER — DOXYCYCLINE HYCLATE 100 MG PO TABS: 100.0000 mg | ORAL_TABLET | Freq: Two times a day (BID) | ORAL | Status: DC

## 2015-04-09 NOTE — Patient Instructions (Addendum)
Flu vaccine and Prevnar refused. Take doxycycline 100 mg twice daily for 10 days for paronychia. Return on Monday for follow-up. Soak thumb in warm hot soapy water 20 minutes twice daily. Take Mobitz for back pain. If symptoms do not improve consider lumbar MRI.

## 2015-04-10 LAB — PSA: PSA: 3.12 ng/mL (ref <=4.00)

## 2015-04-10 LAB — MICROALBUMIN, URINE

## 2015-04-12 ENCOUNTER — Encounter: Payer: Self-pay | Admitting: Internal Medicine

## 2015-04-12 ENCOUNTER — Ambulatory Visit (INDEPENDENT_AMBULATORY_CARE_PROVIDER_SITE_OTHER): Payer: BC Managed Care – PPO | Admitting: Internal Medicine

## 2015-04-12 VITALS — BP 130/84 | HR 66 | Temp 98.1°F | Resp 20 | Ht 74.5 in | Wt 281.0 lb

## 2015-04-12 DIAGNOSIS — L03011 Cellulitis of right finger: Secondary | ICD-10-CM

## 2015-04-12 MED ORDER — HYDROCODONE-ACETAMINOPHEN 10-325 MG PO TABS: 1.0000 | ORAL_TABLET | Freq: Three times a day (TID) | ORAL | Status: DC | PRN

## 2015-04-12 NOTE — Progress Notes (Signed)
   Subjective:    Patient ID: Chad Gardner, male    DOB: 09/14/1965, 49 y.o.   MRN: [id number]  HPI   At last visit he was started on doxycycline 100 mg twice daily for paronychia right thumb. He is here now for recheck. The area has not cleared. In fact it has coalesced into a more centralized abscess.    Review of Systems     Objective:   Physical Exam  After sterile prep and local anesthesia 1% Xylocaine, paronychia  was incised with a #10 scalpel and drained. A small gauze packing was placed in incision and then subsequently wrapped sterilely.      Assessment & Plan:  Paronychia right thumb  Plan: Return in 24 hours. Continue antibiotics.

## 2015-04-12 NOTE — Patient Instructions (Signed)
Leave wrapped up tonight. Take Norco if needed for to pain. Return in 24 hours for follow-up.

## 2015-04-13 ENCOUNTER — Encounter: Payer: Self-pay | Admitting: Internal Medicine

## 2015-04-13 ENCOUNTER — Ambulatory Visit (INDEPENDENT_AMBULATORY_CARE_PROVIDER_SITE_OTHER): Payer: BC Managed Care – PPO | Admitting: Internal Medicine

## 2015-04-13 VITALS — BP 110/80 | HR 64 | Temp 98.2°F | Resp 18 | Ht 75.0 in | Wt 280.0 lb

## 2015-04-13 DIAGNOSIS — L03011 Cellulitis of right finger: Secondary | ICD-10-CM

## 2015-04-13 NOTE — Patient Instructions (Signed)
Finish course of antibiotics. Soak in warm soapy water twice daily for 20 minutes. Dry well. Apply peroxide. Dry well and dressed for the remainder of the week.

## 2015-04-13 NOTE — Progress Notes (Signed)
   Subjective:    Patient ID: Chad LevinsRodney Gardner, male    DOB: Oct 21, 1965, 49 y.o.   MRN: [id number]  HPI  Had paronychia right thumb incised and drained yesterday. He is on doxycycline. He only had to take 1 pain pill last evening. He went to work today without issue.    Review of Systems     Objective:   Physical Exam  Healing incision right thumb. No evidence of draining or bleeding.      Assessment & Plan:  Paronychia right thumb status post incision and drainage  Plan: Soak in warm soapy water 20 minutes twice daily. Dry well and then apply peroxide. Dry well and then bandage for the rest of the week. Finish course of doxycycline. Call if have any questions or concerns.

## 2015-04-14 ENCOUNTER — Ambulatory Visit: Payer: BC Managed Care – PPO | Admitting: Internal Medicine

## 2015-05-20 ENCOUNTER — Telehealth: Payer: Self-pay | Admitting: Internal Medicine

## 2015-05-20 MED ORDER — CYCLOBENZAPRINE HCL 10 MG PO TABS: 10.0000 mg | ORAL_TABLET | Freq: Every day | ORAL | Status: DC

## 2015-05-20 NOTE — Telephone Encounter (Signed)
Still having trouble with his back.  Has had therapy for about 4-5 weeks and it has helped some.  It costs him about $104/week.  He has also done exercises at home on his own.  He's about to have about 20 days off.  He will continue to do the exercises at home on his own especially while he has the days off.  However, he wants to know if you feel that a Cortisone shot may be helpful to him with his pain/discomfort?    He has also had some difficulty sleeping as well.  Has been out of the Flexeril for a bit.  Wants to know if you will consider giving him a refill on this Rx?    Pharmacy:  [facility] [address]  Cell #  [phone]

## 2015-05-20 NOTE — Telephone Encounter (Signed)
Refill Flexeril. We do not give cortisone injections for back pain. If he still has issues after vacation, we will consider an MRI

## 2015-05-20 NOTE — Telephone Encounter (Signed)
Prescription sent to pharmacy; patient made aware

## 2015-06-01 ENCOUNTER — Other Ambulatory Visit: Payer: Self-pay

## 2015-06-01 MED ORDER — GLIPIZIDE ER 2.5 MG PO TB24: 2.5000 mg | ORAL_TABLET | Freq: Every day | ORAL | Status: DC

## 2015-06-01 NOTE — Telephone Encounter (Signed)
Fax received 05/31/2015

## 2015-06-15 ENCOUNTER — Other Ambulatory Visit: Payer: Self-pay | Admitting: Internal Medicine

## 2015-06-15 ENCOUNTER — Encounter: Payer: Self-pay | Admitting: Internal Medicine

## 2015-06-15 LAB — HM DIABETES EYE EXAM

## 2015-07-04 NOTE — Progress Notes (Signed)
Subjective:    Patient ID: Chad Gardner, male    DOB: 1965/10/18, 50 y.o.   MRN: [id number]  HPI 50 year old Black male in today for health maintenance exam. History of low back pain for which he sought physical therapy. History of hypertension and controlled high 2 diabetes mellitus. History of small bilateral renal cysts.  Today he has a new problem which is a paronychia right thumb.  Past medical history: Evaluated by urologist for hematuria. Has small renal cysts  bilaterally. CT of abdomen and pelvis at the time showed small right inguinal hernia. Cystoscopy was normal. Problem with hematuria has not recurred. In 2011 patient was seen by Dr. 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 This was surgically repaired December 2011.  Diabetes is diet controlled.  He is on a 3 drug regimen for hypertension.  Social history: Does not smoke. Drinks alcohol socially. Has 1 teenage son from a previous relationship. He and his current wife have no children they are separated separated. He is an Personnel officer at [employer].  Ophthalmologist is Dr. Dione Booze. Reminded about anal diabetic eye exam. Patient wears bifocals.  Family history: Father with history of prostate cancer still living. Patient says his mother has history of stroke and hypertension. 2 brothers. One brother is well in the other brother has schizophrenia and hypertension. One sister overweight      Review of Systems  Constitutional: Negative.   Genitourinary:       Erectile dysfunction  Musculoskeletal:       Painful right thumb due to paronychia   complains of pain in right buttock extending into right upper leg     Objective:   Physical Exam  Constitutional: He is oriented to person, place, and time. He appears well-developed and well-nourished. No distress.  HENT:  Head: Normocephalic and  atraumatic.  Right Ear: External ear normal.  Left Ear: External ear normal.  Mouth/Throat: Oropharynx is clear and moist. No oropharyngeal exudate.  Eyes: Conjunctivae and EOM are normal. Pupils are equal, round, and reactive to light. Right eye exhibits no discharge. Left eye exhibits no discharge. No scleral icterus.  Neck: Neck supple. No JVD present. No thyromegaly present.  Cardiovascular: Normal rate, regular rhythm and normal heart sounds.   No murmur heard. Pulmonary/Chest: Effort normal and breath sounds normal. No respiratory distress. He has no wheezes. He has no rales.  Abdominal: Soft. Bowel sounds are normal. He exhibits no distension and no mass. There is no tenderness. There is no rebound and no guarding.  Genitourinary: Prostate normal.  Musculoskeletal: He exhibits no edema.  Paronychia right thumb  Lymphadenopathy:    He has no cervical adenopathy.  Neurological: He is alert and oriented to person, place, and time. He has normal reflexes. No cranial nerve deficit. Coordination normal.  Skin: Skin is warm and dry. No rash noted. He is not diaphoretic.  Psychiatric: He has a normal mood and affect. His behavior is normal. Judgment and thought content normal.  Vitals reviewed.         Assessment & Plan:  Paronychia right thumb-soak thumb and start taking doxycycline 100 mg twice daily. Follow-up in 3 days. May need I and D  Low back pain- ? Lumbar radiculopathy. Take Mobic for back pain and if symptoms do not improve in the next few weeks consider MRI.  Essential hypertension-stable  Controlled type 2 diabetes mellitus without complication  Situational stress being  separated from wife  Erectile dysfunction-has been treated with medication in the past. Currently not sexually active.  History of bilateral renal cysts  Plan: Started on doxycycline 100 mg twice daily for 10 days. Return in 3 days for follow-up on paronychia.

## 2015-07-23 ENCOUNTER — Encounter: Payer: Self-pay | Admitting: Internal Medicine

## 2015-07-23 ENCOUNTER — Ambulatory Visit (INDEPENDENT_AMBULATORY_CARE_PROVIDER_SITE_OTHER): Payer: BC Managed Care – PPO | Admitting: Internal Medicine

## 2015-07-23 ENCOUNTER — Other Ambulatory Visit: Payer: Self-pay | Admitting: Internal Medicine

## 2015-07-23 VITALS — BP 124/76 | HR 73 | Temp 97.5°F | Resp 20 | Ht 75.0 in | Wt 284.0 lb

## 2015-07-23 DIAGNOSIS — G44209 Tension-type headache, unspecified, not intractable: Secondary | ICD-10-CM | POA: Diagnosis not present

## 2015-07-23 DIAGNOSIS — J0100 Acute maxillary sinusitis, unspecified: Secondary | ICD-10-CM

## 2015-07-23 MED ORDER — HYDROCODONE-ACETAMINOPHEN 10-325 MG PO TABS: 1.0000 | ORAL_TABLET | Freq: Three times a day (TID) | ORAL | Status: DC | PRN

## 2015-07-23 MED ORDER — AMOXICILLIN-POT CLAVULANATE 875-125 MG PO TABS: 1.0000 | ORAL_TABLET | Freq: Two times a day (BID) | ORAL | Status: DC

## 2015-08-03 NOTE — Patient Instructions (Addendum)
Take Augmentin 875 mg twice daily for 10 days for sinusitis. Hydrocodone/APAP 10/325 one by mouth every 8 hours when necessary headache. Rest and drink plenty of fluids.

## 2015-08-03 NOTE — Progress Notes (Signed)
   Subjective:    Patient ID: Chad Gardner, male    DOB: May 20, 1966, 50 y.o.   MRN: [id number]  HPI Two-week history of URI symptoms with cough and sinus congestion. Has  headache. Maxillary sinus pressure. No fever or shaking chills. History of controlled type 2 diabetes mellitus and hypertension. Some discolored nasal drainage. No significant sputum production. Has malaise and fatigue. Frustrated that rest or infection has drug also long.    Review of Systems     Objective:   Physical Exam  TMs are clear. Pharynx is slightly injected. Neck is supple. Chest clear to auscultation without rales or wheezing. No adenopathy.      Assessment & Plan:  Acute maxillary sinusitis  Headache  Plan: Augmentin 875 mg twice daily for 10 days. Hydrocodone/APAP 10/325 1 by mouth every 8 hours when necessary headache

## 2015-09-16 ENCOUNTER — Other Ambulatory Visit: Payer: Self-pay | Admitting: Internal Medicine

## 2015-10-04 ENCOUNTER — Ambulatory Visit (INDEPENDENT_AMBULATORY_CARE_PROVIDER_SITE_OTHER): Payer: BC Managed Care – PPO | Admitting: Internal Medicine

## 2015-10-04 ENCOUNTER — Other Ambulatory Visit: Payer: Self-pay

## 2015-10-04 ENCOUNTER — Encounter: Payer: Self-pay | Admitting: Internal Medicine

## 2015-10-04 ENCOUNTER — Ambulatory Visit: Payer: Self-pay | Admitting: Internal Medicine

## 2015-10-04 VITALS — BP 130/86 | HR 64 | Temp 97.8°F | Resp 14 | Ht 75.0 in | Wt 283.5 lb

## 2015-10-04 DIAGNOSIS — M5410 Radiculopathy, site unspecified: Secondary | ICD-10-CM | POA: Diagnosis not present

## 2015-10-04 DIAGNOSIS — M545 Low back pain, unspecified: Secondary | ICD-10-CM

## 2015-10-04 MED ORDER — HYDROCODONE-ACETAMINOPHEN 10-325 MG PO TABS: 1.0000 | ORAL_TABLET | Freq: Three times a day (TID) | ORAL | Status: DC | PRN

## 2015-10-04 MED ORDER — PREDNISONE 10 MG (48) PO TBPK: ORAL_TABLET | ORAL | Status: DC

## 2015-10-04 MED ORDER — CYCLOBENZAPRINE HCL 10 MG PO TABS: 10.0000 mg | ORAL_TABLET | Freq: Every day | ORAL | Status: DC

## 2015-10-04 NOTE — Patient Instructions (Signed)
Hydrocodone/APAP 10/325 one by mouth every 8 hours when necessary back pain. Sterapred DS 10 mg 12 day dosepak take as directed. Out of work for a few days for back pain. Do not do back exercises while having acute back pain. Try get MRI of LS-spine approved by insurance company. Refill Flexeril. No heavy lifting.

## 2015-10-04 NOTE — Progress Notes (Signed)
   Subjective:    Patient ID: Chad Gardner, male    DOB: [birth year], 50 y.o.   MRN: [id number]  HPI Patient did some work in the attic on Friday, April 14. He was crawling around with some electrical cord. Apparently strained his back again. This time has had more left-sided than right-sided pain but actually right side has flared up as well. Says pain and right back is going down right leg with some numbness. Left side does not have numbness.    Review of Systems     Objective:   Physical Exam He has tenderness over his left posterior superior iliac spine. Straight leg raising is negative at 90 bilaterally. Muscle strength is normal in the lower extremities bilaterally. Deep tendon reflexes 1-2+ in the knees bilaterally. Decreased range of motion and the trunk. No foot drop.       Assessment & Plan:  Right back pain with right radiculopathy  Left back pain  Plan: Sterapred DS 10 mg 12 day dosepak take as directed. Refill hydrocodone/APAP 10/325 #60 one by mouth every 8 hours when necessary pain. Refill Flexeril to take it bedtime. Out of work for a few days. Do not do back exercises while having acute back pain. Try to get MRI approved by insurance company for recurrent right radiculopathy. Has had previous physical therapy for same issue.

## 2015-10-05 NOTE — Addendum Note (Signed)
Addended by: Dierdre ForthHURCH, Nada Godley L on: 10/05/2015 04:55 PM   Modules accepted: Orders

## 2015-10-06 ENCOUNTER — Ambulatory Visit
Admission: RE | Admit: 2015-10-06 | Discharge: 2015-10-06 | Disposition: A | Discharge status: Home / Self Care | Payer: BC Managed Care – PPO | Source: Ambulatory Visit | Attending: Internal Medicine | Admitting: Internal Medicine

## 2015-10-06 DIAGNOSIS — M545 Low back pain, unspecified: Secondary | ICD-10-CM

## 2015-10-06 DIAGNOSIS — M5410 Radiculopathy, site unspecified: Secondary | ICD-10-CM

## 2015-10-13 ENCOUNTER — Telehealth: Payer: Self-pay | Admitting: Internal Medicine

## 2015-10-13 DIAGNOSIS — M545 Low back pain: Secondary | ICD-10-CM

## 2015-10-13 NOTE — Telephone Encounter (Signed)
Patient contacted regarding FMLA paperwork completed for back pain. We are waiting to hear from neurosurgeon regarding an appointment.

## 2015-10-15 ENCOUNTER — Other Ambulatory Visit: Payer: Self-pay

## 2015-10-15 ENCOUNTER — Telehealth: Payer: Self-pay | Admitting: Internal Medicine

## 2015-10-15 MED ORDER — METHYLPREDNISOLONE 4 MG PO TABS: ORAL_TABLET | ORAL | Status: DC

## 2015-10-15 NOTE — Telephone Encounter (Signed)
Patient says he's continuing to have significant back pain and left radiculopathy. History of right radiculopathy. MRI shows impingement right L3. He has been out of work for several days. We just completed FMLA paperwork for him earlier this week. He is finishing a 12 day Sterapred taper. He says he can walk around for a couple of hours and back begins to hurt quite a bit. He is willing to try epidural steroid injection. We are waiting on neurosurgery appointment. In the meantime, will try Medrol 4 mg 6 day dosepak.

## 2015-10-19 ENCOUNTER — Encounter: Payer: Self-pay | Admitting: Internal Medicine

## 2015-10-19 ENCOUNTER — Telehealth: Payer: Self-pay | Admitting: Internal Medicine

## 2015-10-19 NOTE — Telephone Encounter (Signed)
Phone call from BisonHaley at  Dr. Fredrich BirksStern's office. Mr. Chad Gardner is to have an epidural steroid injection with Dr. Ollen BowlHarkins in late May and then will see Dr. Venetia MaxonStern June 7 th. He is to complete Medrol Dosepak and I prescribed last week. Rolly SalterHaley says Mr. Fei is aware of these appointments.

## 2015-10-19 NOTE — Telephone Encounter (Signed)
Says he's having considerable pain. Advised him to take more pain medication. He's been using it sparingly. No significant response with Medrol Dosepak after 3 or 4 days. Says he is going to need to be out of work a bit longer. Is to try to get coworkers to donate some time for him. He's anxious. Told him Rolly SalterHaley had worked out an epidural steroid injection with Dr. Ollen BowlHarkins next week which is excellent. He will still see Dr. Venetia MaxonStern June 7.

## 2015-11-10 ENCOUNTER — Other Ambulatory Visit: Payer: Self-pay | Admitting: Internal Medicine

## 2015-12-06 ENCOUNTER — Other Ambulatory Visit: Payer: Self-pay | Admitting: Internal Medicine

## 2015-12-06 NOTE — Telephone Encounter (Signed)
Verbal order by Dr. Lenord FellersBaxley; ok to refill Glipizide ER 2.5 mg, #90, 0 refills.  [facility] @ [phone].

## 2015-12-15 ENCOUNTER — Ambulatory Visit (INDEPENDENT_AMBULATORY_CARE_PROVIDER_SITE_OTHER): Payer: BC Managed Care – PPO | Admitting: Internal Medicine

## 2015-12-15 ENCOUNTER — Encounter: Payer: Self-pay | Admitting: Internal Medicine

## 2015-12-15 VITALS — BP 126/76 | HR 88 | Temp 97.6°F | Resp 18 | Wt 272.0 lb

## 2015-12-15 DIAGNOSIS — Q762 Congenital spondylolisthesis: Secondary | ICD-10-CM

## 2015-12-15 DIAGNOSIS — Z9889 Other specified postprocedural states: Secondary | ICD-10-CM

## 2015-12-15 DIAGNOSIS — M4310 Spondylolisthesis, site unspecified: Secondary | ICD-10-CM

## 2015-12-15 NOTE — Progress Notes (Signed)
   Subjective:    Patient ID: Chad Gardner, male    DOB: [birth year], 50 y.o.   MRN: [id number]  HPI  Patient had left L2-L3 microdiscectomy for lumbar disc herniation with radiculopathy. Surgery was on June 22. His last day of working was May 27. He has an appointment to see Dr. Venetia MaxonStern July 17. His incision appears to be healing well. He has concerns about that because he really could not see it. I showed it to him with a mirror. He lives alone. He is able to prepare his own food. He's been moving about the house a fair amount. I've asked him to start taking some walks to help get his strength back. He has felt fatigued. I think he is a little bit depressed. He does have from time to time some radiculopathy in his right leg as well.   He has been separated from his wife for a year. He doesn't see any hope that she will be coming back to work on the marriage. She is busy with her job out of state. This is of concern to him because he is religious.  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 He says there is no light duty at work. 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 He brings in paperwork today to be completed to be out of work. We talked about this at length. He would like to go back to work but doesn't feel up to it this point. We have decided to to give him a potential return date of September 11 and if he feels he can return to work sooner he will.  He's able to drive some.  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  He has  hydrocodone/APAP and Flexeril for pain.    Review of Systems as above. Has some left back pain at times and some soreness around his incision.     Objective:   Physical Exam  Incision was inspected and there is no drainage or redness. It seems to be healing quite well. Spent  20 minutes with him speaking about these issues of concern to him      Assessment & Plan:  Status post left L2-L3 microdiscectomy incision healing well. Encouraged patient to start taking walks instead of just moving about the house. I think that'll help strengthen his back and hopefully relieve his boredom. Follow-up with Dr. Venetia MaxonStern next week. He will call and let me know how he's feeling in the next 2-3 weeks. Paperwork completed for work..Marland Kitchen

## 2015-12-15 NOTE — Patient Instructions (Signed)
Start taking walks and increase time of walking gradually. Continue Norco Flexeril as needed sparingly.

## 2016-01-17 ENCOUNTER — Other Ambulatory Visit: Payer: Self-pay | Admitting: Internal Medicine

## 2016-02-10 ENCOUNTER — Encounter: Payer: Self-pay | Admitting: Internal Medicine

## 2016-02-10 ENCOUNTER — Ambulatory Visit (INDEPENDENT_AMBULATORY_CARE_PROVIDER_SITE_OTHER): Payer: BC Managed Care – PPO | Admitting: Internal Medicine

## 2016-02-10 VITALS — BP 130/80 | HR 97 | Temp 97.2°F | Ht 75.0 in | Wt 271.0 lb

## 2016-02-10 DIAGNOSIS — M545 Low back pain: Secondary | ICD-10-CM

## 2016-02-10 DIAGNOSIS — Z9889 Other specified postprocedural states: Secondary | ICD-10-CM | POA: Diagnosis not present

## 2016-02-10 DIAGNOSIS — I10 Essential (primary) hypertension: Secondary | ICD-10-CM

## 2016-02-10 DIAGNOSIS — Q6102 Congenital multiple renal cysts: Secondary | ICD-10-CM | POA: Diagnosis not present

## 2016-02-10 DIAGNOSIS — E119 Type 2 diabetes mellitus without complications: Secondary | ICD-10-CM | POA: Diagnosis not present

## 2016-02-10 DIAGNOSIS — N281 Cyst of kidney, acquired: Secondary | ICD-10-CM

## 2016-02-10 NOTE — Patient Instructions (Addendum)
Paperwork completed for him to return to work October 2. Hemoglobin A1c drawn today. Continue to monitor blood pressure at home and call if persistently elevated. Begin doing exercises shown it physical therapy and continue to walk daily. Diet exercise and weight loss recommended. Last physical exam was November 2016.

## 2016-02-10 NOTE — Progress Notes (Signed)
   Subjective:    Patient ID: Chad Gardner, male    DOB: 26-Apr-1966, 50 y.o.   MRN: [id number]  HPI Pt s/p  Lumbar disc surgery surgery per Dr. Venetia MaxonStern June 22. He had an L2-L3 microdiscectomy for lumbar disc herniation with radiculopathy. Is to return to work October 2nd. Says he recently saw Dr. Venetia MaxonStern and agreed upon him returning to work October 2. His last day of working was May 27. He drove to [place] this weekend for the Labor Day holiday to see family and friends. He has some mild back soreness that is persistent status post surgery. Dr. Venetia MaxonStern assures him that  will  improve.  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 He says there is no light duty at work. He feels his Accu-Cheks at been fairly stable. He hasn't been watching his blood pressure at home is much as he should. He'll begin to do that and let me know if persistently elevated.  Dr. Venetia MaxonStern has recommended that he lose some weight. I would agree with that. Says he is walking 1 hour a day.  Has a question about bilateral renal cyst. These been documented back to at least 2012 on x-ray. He thought there might be some sort of leakage with a renal cyst but I assured him that the way the MRI read was  just reflecting that he had a fluid-filled cystic lesion. I did offer to send him to urologist but we decided to hold off on that.  He has some anxiety surrounding his health. He is not taking Xanax at the present time.  Review of Systems see above     Objective:   Physical Exam Spent 25 minutes with him talking about these issues. He had a urine sent for microalbumin. Hemoglobin A1c drawn and pending. Chest is clear. Cardiac exam regular rate and rhythm normal S1 and S2. Extremities without edema.       Assessment & Plan:  Controlled type 2 diabetes mellitus  Essential hypertension  Obesity  Chronic back pain  Status post lumbar dissectomy L2-L3  Plan: Patient is to watch blood pressure at home and contact me if persistently  elevated. Otherwise we will plan for him to return to work October 2 full-time without any restrictions. He will be due for physical exam sometime after mid-November.

## 2016-02-11 ENCOUNTER — Telehealth: Payer: Self-pay

## 2016-02-11 LAB — HEMOGLOBIN A1C
Hgb A1c MFr Bld: 6.1 % — ABNORMAL HIGH (ref <5.7)
Mean Plasma Glucose: 128 mg/dL

## 2016-02-11 NOTE — Telephone Encounter (Signed)
-----   Message from Margaree MackintoshMary J Baxley, MD sent at 02/11/2016 11:06 AM EDT ----- Please call pt and give results. DM under good control.

## 2016-02-11 NOTE — Telephone Encounter (Signed)
Called patient. Gave lab results. Patient verbalized understanding.  

## 2016-02-16 ENCOUNTER — Telehealth: Payer: Self-pay | Admitting: Internal Medicine

## 2016-02-16 NOTE — Telephone Encounter (Signed)
Blood pressure readings are stable. No change in treatment. Continue same medications.

## 2016-02-16 NOTE — Telephone Encounter (Signed)
Calling with BP readings:  9/8 128/83 9/9 119/83 9/10 116/79 9/11 123/72 9/12 117/79 9/13 118/76  He took all of these readings in the mornings first thing with medication.    He has also booked his PE for December.

## 2016-03-10 ENCOUNTER — Other Ambulatory Visit: Payer: Self-pay | Admitting: Internal Medicine

## 2016-03-14 ENCOUNTER — Telehealth: Payer: Self-pay | Admitting: Internal Medicine

## 2016-03-14 ENCOUNTER — Encounter: Payer: Self-pay | Admitting: Internal Medicine

## 2016-03-14 NOTE — Telephone Encounter (Signed)
Patient brought additional paperwork by yesterday to be filled out and is due by October 15. He did return to work October 2 and is doing okay. Says paperwork runs a month behind. He needs by 4 completed to receive benefits. This will be completed.

## 2016-03-15 ENCOUNTER — Other Ambulatory Visit: Payer: Self-pay | Admitting: Internal Medicine

## 2016-03-15 NOTE — Telephone Encounter (Signed)
Verbal order by Dr. Lenord FellersBaxley; ok to refill Losartan 100mg  for 90 days.  To Mitzi for e-scribing.

## 2016-03-31 ENCOUNTER — Other Ambulatory Visit: Payer: Self-pay | Admitting: Internal Medicine

## 2016-05-22 ENCOUNTER — Other Ambulatory Visit: Payer: Self-pay | Admitting: Internal Medicine

## 2016-05-25 ENCOUNTER — Encounter: Payer: Self-pay | Admitting: Internal Medicine

## 2016-05-25 ENCOUNTER — Ambulatory Visit (INDEPENDENT_AMBULATORY_CARE_PROVIDER_SITE_OTHER): Payer: BC Managed Care – PPO | Admitting: Internal Medicine

## 2016-05-25 ENCOUNTER — Other Ambulatory Visit: Payer: BC Managed Care – PPO | Admitting: Internal Medicine

## 2016-05-25 VITALS — BP 110/72 | HR 78 | Temp 97.8°F | Ht 75.0 in | Wt 271.0 lb

## 2016-05-25 DIAGNOSIS — I10 Essential (primary) hypertension: Secondary | ICD-10-CM | POA: Diagnosis not present

## 2016-05-25 DIAGNOSIS — N529 Male erectile dysfunction, unspecified: Secondary | ICD-10-CM

## 2016-05-25 DIAGNOSIS — Z0000 Encounter for general adult medical examination without abnormal findings: Secondary | ICD-10-CM

## 2016-05-25 DIAGNOSIS — N281 Cyst of kidney, acquired: Secondary | ICD-10-CM

## 2016-05-25 DIAGNOSIS — Z9889 Other specified postprocedural states: Secondary | ICD-10-CM | POA: Diagnosis not present

## 2016-05-25 DIAGNOSIS — E119 Type 2 diabetes mellitus without complications: Secondary | ICD-10-CM

## 2016-05-25 DIAGNOSIS — R351 Nocturia: Secondary | ICD-10-CM

## 2016-05-25 DIAGNOSIS — N401 Enlarged prostate with lower urinary tract symptoms: Secondary | ICD-10-CM | POA: Diagnosis not present

## 2016-05-25 LAB — COMPLETE METABOLIC PANEL WITH GFR
ALBUMIN: 4.3 g/dL (ref 3.6–5.1)
ALK PHOS: 63 U/L (ref 40–115)
ALT: 16 U/L (ref 9–46)
AST: 18 U/L (ref 10–35)
BILIRUBIN TOTAL: 0.7 mg/dL (ref 0.2–1.2)
BUN: 16 mg/dL (ref 7–25)
CALCIUM: 9.2 mg/dL (ref 8.6–10.3)
CO2: 27 mmol/L (ref 20–31)
Chloride: 102 mmol/L (ref 98–110)
Creat: 0.89 mg/dL (ref 0.70–1.33)
Glucose, Bld: 114 mg/dL — ABNORMAL HIGH (ref 65–99)
Potassium: 4.2 mmol/L (ref 3.5–5.3)
Sodium: 139 mmol/L (ref 135–146)
TOTAL PROTEIN: 7.1 g/dL (ref 6.1–8.1)

## 2016-05-25 LAB — CBC WITH DIFFERENTIAL/PLATELET
BASOS ABS: 38 {cells}/uL (ref 0–200)
Basophils Relative: 1 %
EOS ABS: 38 {cells}/uL (ref 15–500)
Eosinophils Relative: 1 %
HCT: 49 % (ref 38.5–50.0)
HEMOGLOBIN: 16.6 g/dL (ref 13.2–17.1)
LYMPHS ABS: 2052 {cells}/uL (ref 850–3900)
Lymphocytes Relative: 54 %
MCH: 28.7 pg (ref 27.0–33.0)
MCHC: 33.9 g/dL (ref 32.0–36.0)
MCV: 84.6 fL (ref 80.0–100.0)
MPV: 9.1 fL (ref 7.5–12.5)
Monocytes Absolute: 304 cells/uL (ref 200–950)
Monocytes Relative: 8 %
NEUTROS ABS: 1368 {cells}/uL — AB (ref 1500–7800)
NEUTROS PCT: 36 %
Platelets: 215 10*3/uL (ref 140–400)
RBC: 5.79 MIL/uL (ref 4.20–5.80)
RDW: 14.5 % (ref 11.0–15.0)
WBC: 3.8 10*3/uL (ref 3.8–10.8)

## 2016-05-25 LAB — LIPID PANEL
CHOLESTEROL: 147 mg/dL (ref <200)
HDL: 45 mg/dL (ref >40)
LDL Cholesterol: 90 mg/dL (ref <100)
TRIGLYCERIDES: 58 mg/dL (ref <150)
Total CHOL/HDL Ratio: 3.3 Ratio (ref <5.0)
VLDL: 12 mg/dL (ref <30)

## 2016-05-25 LAB — HEMOGLOBIN A1C
Hgb A1c MFr Bld: 5.7 % — ABNORMAL HIGH (ref <5.7)
Mean Plasma Glucose: 117 mg/dL

## 2016-05-25 LAB — POCT URINALYSIS DIPSTICK
Bilirubin, UA: NEGATIVE
Blood, UA: NEGATIVE
Glucose, UA: NEGATIVE
Ketones, UA: NEGATIVE
LEUKOCYTES UA: NEGATIVE
NITRITE UA: NEGATIVE
PROTEIN UA: NEGATIVE
SPEC GRAV UA: 1.015
UROBILINOGEN UA: NEGATIVE
pH, UA: 6

## 2016-05-25 LAB — PSA: PSA: 0.3 ng/mL (ref <=4.0)

## 2016-05-25 MED ORDER — TAMSULOSIN HCL 0.4 MG PO CAPS: 0.4000 mg | ORAL_CAPSULE | Freq: Every day | ORAL | 3 refills | Status: DC

## 2016-05-26 LAB — MICROALBUMIN, URINE: Microalb, Ur: 0.3 mg/dL

## 2016-06-02 NOTE — Patient Instructions (Signed)
Was a pleasure seeing today. Continue same medications and return in 6 months. Trial of Flomax for BPH symptoms.

## 2016-06-02 NOTE — Progress Notes (Signed)
   Subjective:    Patient ID: Chad LevinsRodney Schoon, male    DOB: May 05, 1966, 50 y.o.   MRN: [id number]  HPI 50 year old Black Male status post lumbar disc surgery per Dr. Venetia MaxonStern on June 22. He had an L2-L3 microdiscectomy for lumbar disc herniation with radiculopathy. Returned to work in October. Status post surgery, seems to be doing pretty well. Has to take some Zanaflex on occasion.  He's having some issues with nocturia. We will try Flomax.  History of erectile dysfunction but currently not sexually active.  History of diabetes mellitus.  History of bilateral renal cysts that a been documented at least back to 2012.  Obesity- needs  to watch diet. 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  In 2011 Dr. Teressa SenterSypher repaired chronic stenosing tenosynovitis (de Quervain's stenosing tenosynovitis) first dorsal compartment left wrist.  Social history: Does not smoke drinks alcohol socially. Has 1 teenage son from a previous relationship. He and his wife are separated. She is living out of state.  Ophthalmologist is Dr. Dione BoozeGroat. Patient reminded about annual diabetic eye exam. He wears bifocals.  Family history: Father with history of prostate cancer still living. Patient says his mother has history of stroke and hypertension. He has 2 brothers. One brother is well and the other brother has schizophrenia and hypertension. One sister overweight.        Review of Systems occasional low back pain but markedly improved after surgery. Some nocturia which is a new complaint.     Objective:   Physical Exam  Constitutional: He is oriented to person, place, and time. He appears well-developed and well-nourished. No distress.  HENT:  Head: Normocephalic and atraumatic.  Right Ear: External ear normal.  Left Ear: External ear normal.  Mouth/Throat: Oropharynx is clear and moist. No oropharyngeal exudate.  Eyes: Conjunctivae and EOM are normal. Pupils are equal, round, and  reactive to light. Right eye exhibits no discharge. Left eye exhibits no discharge.  Neck: Neck supple. No JVD present. No thyromegaly present.  Cardiovascular: Normal rate, regular rhythm, normal heart sounds and intact distal pulses.   No murmur heard. Pulmonary/Chest: Effort normal and breath sounds normal. He has no wheezes. He has no rales.  Abdominal: Soft. Bowel sounds are normal. He exhibits no distension and no mass. There is no tenderness. There is no rebound and no guarding.  Genitourinary:  Genitourinary Comments: Prostate symmetrical but enlarged  Musculoskeletal: He exhibits no edema.  Lymphadenopathy:    He has no cervical adenopathy.  Neurological: He is alert and oriented to person, place, and time. He has normal reflexes. No cranial nerve deficit.  Skin: Skin is warm and dry. No rash noted. He is not diaphoretic.  Psychiatric: He has a normal mood and affect. His behavior is normal. Judgment and thought content normal.  Vitals reviewed.         Assessment & Plan:  Essential hypertension-stable on 3 drug regimen  History of lumbar disc surgery-improved considerably with occasional low back pain treated with Zanaflex  Bilateral renal cysts  Nocturia-trial of Flomax-has BPH  History of erectile dysfunction-currently not sexually active  Controlled type 2 diabetes mellitus-11 A1c stable at 5.7%  Plan: Continue same medications and return in 6 months. Trial of Flomax 0.4 mg. Daily. Encouraged diet exercise and weight loss.  Discussed need for colonoscopy.

## 2016-07-03 ENCOUNTER — Telehealth: Payer: Self-pay | Admitting: Internal Medicine

## 2016-07-03 NOTE — Telephone Encounter (Signed)
Discontinue it. No further treatment.

## 2016-07-03 NOTE — Telephone Encounter (Signed)
Pt was notified of instructions, pt verbalized understanding.   

## 2016-07-03 NOTE — Telephone Encounter (Signed)
Patient said the medication (Flomax) is making him sleepy.

## 2016-07-15 ENCOUNTER — Other Ambulatory Visit: Payer: Self-pay | Admitting: Internal Medicine

## 2016-07-31 ENCOUNTER — Ambulatory Visit (INDEPENDENT_AMBULATORY_CARE_PROVIDER_SITE_OTHER): Payer: BC Managed Care – PPO | Admitting: Internal Medicine

## 2016-07-31 ENCOUNTER — Encounter: Payer: Self-pay | Admitting: Internal Medicine

## 2016-07-31 VITALS — BP 120/88 | HR 59 | Temp 97.0°F | Ht 75.0 in | Wt 281.0 lb

## 2016-07-31 DIAGNOSIS — L03011 Cellulitis of right finger: Secondary | ICD-10-CM

## 2016-07-31 MED ORDER — TIZANIDINE HCL 2 MG PO TABS: 2.0000 mg | ORAL_TABLET | ORAL | 1 refills | Status: DC | PRN

## 2016-07-31 MED ORDER — DOXYCYCLINE HYCLATE 100 MG PO TABS: 100.0000 mg | ORAL_TABLET | Freq: Two times a day (BID) | ORAL | 0 refills | Status: DC

## 2016-07-31 NOTE — Progress Notes (Signed)
   Subjective:    Patient ID: Corwin LevinsRodney Yeagle, male    DOB: 06/17/1965, 51 y.o.   MRN: [id number]  HPI in November 2016, he developed a paronychia of his right thumb. I initially started him on doxycycline but it subsequently required incision and drainage which we did here. It subsequently resolved and he did well until  recently. He now has another paronychia right thumb.  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 He is right-handed.  His tetanus immunization is up-to-date having been given in 2010.    Review of Systems no fever or shaking chills. History of impaired glucose tolerance. 2 months ago hemoglobin A1c was 5.7% which was excellent.     Objective:   Physical Exam Paronychia right lateral thumb       Assessment & Plan:  Suggest he have incision and drainage by hand surgeon. Start on doxycycline this evening 100 mg twice daily for 10 days.

## 2016-07-31 NOTE — Patient Instructions (Addendum)
Doxycycline 100 mg twice daily for 10 days. To see Dr. Betha LoaKevin Kuzma tomorrow for incision and drainage. Zanaflex refilled for back pain

## 2016-09-07 ENCOUNTER — Other Ambulatory Visit: Payer: Self-pay | Admitting: Internal Medicine

## 2016-09-13 ENCOUNTER — Other Ambulatory Visit: Payer: Self-pay | Admitting: Internal Medicine

## 2016-11-21 ENCOUNTER — Encounter: Payer: Self-pay | Admitting: Internal Medicine

## 2016-11-21 ENCOUNTER — Ambulatory Visit (INDEPENDENT_AMBULATORY_CARE_PROVIDER_SITE_OTHER): Payer: BC Managed Care – PPO | Admitting: Internal Medicine

## 2016-11-21 VITALS — BP 118/80 | HR 58 | Temp 97.5°F | Wt 271.0 lb

## 2016-11-21 DIAGNOSIS — I10 Essential (primary) hypertension: Secondary | ICD-10-CM

## 2016-11-21 DIAGNOSIS — E119 Type 2 diabetes mellitus without complications: Secondary | ICD-10-CM

## 2016-11-21 DIAGNOSIS — Z9889 Other specified postprocedural states: Secondary | ICD-10-CM

## 2016-11-21 MED ORDER — CYCLOBENZAPRINE HCL 10 MG PO TABS: 10.0000 mg | ORAL_TABLET | Freq: Every day | ORAL | 5 refills | Status: DC

## 2016-11-21 NOTE — Progress Notes (Signed)
   Subjective:    Patient ID: Chad Gardner, male    DOB: [birth year], 51 y.o.   MRN: [id number]  HPI Since May 2017 has lost 12 pounds. He is status post back surgery Summer of 2017 by Dr. Venetia MaxonStern.  Prior to surgery, MRI showed left disc rupture at L2-L3, moderate impingement at L3-L4 and L5-S1. Mild impingement at L-L5. He also had bilateral chronic pars defects with grade 1 anterolisthesis.  Has to take some Zanaflex on occasion for back pain.  On 11/25/2015 he underwent L2-L3 microdiscectomy for lumbar disc herniation with radiculopathy  He returned to work October 2 and has been doing fine.  In February 2018 he had issue with paronychia right thumb treated by  I and D by Dr. Merlyn LotKuzma.  He has a history of controlled type 2 diabetes mellitus. Hemoglobin A1c is excellent at 5.7% urine dipstick is normal. No significant microalbuminuria. Basic metabolic panel is normal.  Blood pressure stable at 118/80 on current regimen.        Review of Systems no new complaints     Objective:   Physical Exam Skin warm and dry. Nodes none. Neck is supple without JVD thyromegaly or carotid bruits. Chest clear to auscultation without rales or wheezing. Cardiac exam regular rate and rhythm normal S1 and S2 without murmurs or gallops extremities without edema       Assessment & Plan:  Essential hypertension-stable on current regimen  Type 2 diabetes mellitus controlled with no complications  History of herniated disc L2-L3 status post surgery and doing well  Plan: Return in 6 months for physical examination. Continue same medications.

## 2016-11-22 LAB — BASIC METABOLIC PANEL
BUN: 24 mg/dL (ref 7–25)
CALCIUM: 9 mg/dL (ref 8.6–10.3)
CHLORIDE: 105 mmol/L (ref 98–110)
CO2: 20 mmol/L (ref 20–31)
CREATININE: 0.96 mg/dL (ref 0.70–1.33)
Glucose, Bld: 100 mg/dL — ABNORMAL HIGH (ref 65–99)
Potassium: 3.7 mmol/L (ref 3.5–5.3)
SODIUM: 139 mmol/L (ref 135–146)

## 2016-11-22 LAB — MICROALBUMIN / CREATININE URINE RATIO
Creatinine, Urine: 141 mg/dL (ref 20–370)
MICROALB UR: 0.5 mg/dL
MICROALB/CREAT RATIO: 4 ug/mg{creat} (ref <30)

## 2016-11-22 LAB — HEMOGLOBIN A1C
HEMOGLOBIN A1C: 5.7 % — AB (ref <5.7)
MEAN PLASMA GLUCOSE: 117 mg/dL

## 2016-12-02 ENCOUNTER — Encounter: Payer: Self-pay | Admitting: Internal Medicine

## 2016-12-02 NOTE — Patient Instructions (Signed)
It was pleasure to see you today. Continue same medications and return in 6 months for physical examination

## 2017-01-13 ENCOUNTER — Other Ambulatory Visit: Payer: Self-pay | Admitting: Internal Medicine

## 2017-03-10 ENCOUNTER — Other Ambulatory Visit: Payer: Self-pay | Admitting: Internal Medicine

## 2017-03-11 NOTE — Telephone Encounter (Signed)
Refill through January. Has appt then.

## 2017-03-30 ENCOUNTER — Other Ambulatory Visit: Payer: Self-pay | Admitting: Internal Medicine

## 2017-05-16 ENCOUNTER — Encounter: Payer: Self-pay | Admitting: Internal Medicine

## 2017-06-09 ENCOUNTER — Other Ambulatory Visit: Payer: Self-pay | Admitting: Internal Medicine

## 2017-06-13 ENCOUNTER — Other Ambulatory Visit: Payer: Self-pay | Admitting: Internal Medicine

## 2017-06-13 DIAGNOSIS — Z0000 Encounter for general adult medical examination without abnormal findings: Secondary | ICD-10-CM

## 2017-06-13 DIAGNOSIS — Z13220 Encounter for screening for lipoid disorders: Secondary | ICD-10-CM

## 2017-06-13 DIAGNOSIS — Z125 Encounter for screening for malignant neoplasm of prostate: Secondary | ICD-10-CM

## 2017-06-13 DIAGNOSIS — I10 Essential (primary) hypertension: Secondary | ICD-10-CM

## 2017-06-13 DIAGNOSIS — E118 Type 2 diabetes mellitus with unspecified complications: Secondary | ICD-10-CM

## 2017-06-25 ENCOUNTER — Other Ambulatory Visit: Payer: BC Managed Care – PPO | Admitting: Internal Medicine

## 2017-06-25 ENCOUNTER — Ambulatory Visit: Payer: BC Managed Care – PPO | Admitting: Internal Medicine

## 2017-06-25 ENCOUNTER — Encounter: Payer: Self-pay | Admitting: Internal Medicine

## 2017-06-25 VITALS — BP 140/90 | HR 66 | Ht 73.0 in | Wt 280.0 lb

## 2017-06-25 DIAGNOSIS — I10 Essential (primary) hypertension: Secondary | ICD-10-CM

## 2017-06-25 DIAGNOSIS — Z0000 Encounter for general adult medical examination without abnormal findings: Secondary | ICD-10-CM

## 2017-06-25 DIAGNOSIS — E118 Type 2 diabetes mellitus with unspecified complications: Secondary | ICD-10-CM

## 2017-06-25 DIAGNOSIS — Z125 Encounter for screening for malignant neoplasm of prostate: Secondary | ICD-10-CM

## 2017-06-25 DIAGNOSIS — Z13220 Encounter for screening for lipoid disorders: Secondary | ICD-10-CM

## 2017-06-25 DIAGNOSIS — Z6836 Body mass index (BMI) 36.0-36.9, adult: Secondary | ICD-10-CM

## 2017-06-25 DIAGNOSIS — N281 Cyst of kidney, acquired: Secondary | ICD-10-CM | POA: Diagnosis not present

## 2017-06-25 DIAGNOSIS — R351 Nocturia: Secondary | ICD-10-CM | POA: Diagnosis not present

## 2017-06-25 DIAGNOSIS — N401 Enlarged prostate with lower urinary tract symptoms: Secondary | ICD-10-CM | POA: Diagnosis not present

## 2017-06-25 DIAGNOSIS — E119 Type 2 diabetes mellitus without complications: Secondary | ICD-10-CM

## 2017-06-25 DIAGNOSIS — Z9889 Other specified postprocedural states: Secondary | ICD-10-CM | POA: Diagnosis not present

## 2017-06-25 LAB — POCT URINALYSIS DIPSTICK
Appearance: NORMAL
BILIRUBIN UA: NEGATIVE
Blood, UA: NEGATIVE
GLUCOSE UA: NEGATIVE
Ketones, UA: NEGATIVE
LEUKOCYTES UA: NEGATIVE
Nitrite, UA: NEGATIVE
Odor: NORMAL
Protein, UA: NEGATIVE
SPEC GRAV UA: 1.015 (ref 1.010–1.025)
Urobilinogen, UA: 0.2 E.U./dL
pH, UA: 6.5 (ref 5.0–8.0)

## 2017-06-25 MED ORDER — HYDROCODONE-ACETAMINOPHEN 10-325 MG PO TABS: 1.0000 | ORAL_TABLET | Freq: Four times a day (QID) | ORAL | 0 refills | Status: DC | PRN

## 2017-06-25 MED ORDER — TRIAMCINOLONE ACETONIDE 0.1 % EX CREA: TOPICAL_CREAM | CUTANEOUS | 0 refills | Status: DC

## 2017-06-25 NOTE — Progress Notes (Signed)
Subjective:    Patient ID: Chad Gardner, male    DOB: 1965/06/18, 52 y.o.   MRN: [id number]  HPI 52 year old Black Male for health maintenance exam and evaluation of medical issues. Hx well controlled DM, hypertension, bilateral renal cyst, nocturias.  He had an L2-L3 microdiscectomy for lumbar disc herniation with radiculopathy November 25, 2015 by Dr. Venetia Maxon.  He is doing well status post surgery.  Occasionally has some low back pain.  He works as an Personnel officer at [employer].  Has a fairly physical job.  Heavy lifting and prolonged standing aggravate his back pain.  History of erectile dysfunction but currently not sexually active.  He did not tolerate Flomax for BPH/nocturia.  Most of his exercise has to do with his house and yard work as well as his job as an Personnel officer in [employer].  In 2011 Dr. Teressa Senter repaired chronic stenosing tenosynovitis (de Quervain's stenosing tenosynovitis) first dorsal compartment left wrist.  Social history: He and his wife are separated.  They have no children.  He has 1 teenage son from a previous relationship.  Does not smoke.  Drinks alcohol socially.  Wife may be living out of state.  They are Jehovah's Witnesses.    Ophthalmologist is Dr. Dione Booze.  Patient reminded about annual diabetic eye exam.  He wears bifocals.  Family history: Father with history of prostate cancer still living.  Patient says mother has history of stroke and hypertension.  He has 2 brothers.  One brother is well and the other brother has schizophrenia and hypertension.  One sister overweight.      Review of Systems  Genitourinary:       Nocturia  Musculoskeletal: Positive for back pain.       Objective:   Physical Exam  Constitutional: He is oriented to person, place, and time. He appears well-developed and well-nourished. No distress.  HENT:  Head: Normocephalic and atraumatic.  Right Ear: External ear normal.  Left Ear: External ear normal.  Mouth/Throat: Oropharynx is clear  and moist. No oropharyngeal exudate.  Eyes: Conjunctivae and EOM are normal. Pupils are equal, round, and reactive to light. Right eye exhibits no discharge. Left eye exhibits no discharge. No scleral icterus.  Neck: Neck supple. No JVD present. No thyromegaly present.  Cardiovascular: Normal rate, regular rhythm, normal heart sounds and intact distal pulses.  No murmur heard. Pulmonary/Chest: Effort normal and breath sounds normal. No respiratory distress. He has no wheezes. He has no rales.  Abdominal: He exhibits no distension and no mass. There is no tenderness. There is no rebound and no guarding.  Genitourinary:  Genitourinary Comments: Prostate enlarged smooth without nodules  Musculoskeletal: He exhibits no edema.  Lymphadenopathy:    He has no cervical adenopathy.  Neurological: He is alert and oriented to person, place, and time. He has normal reflexes. No cranial nerve deficit. Coordination normal.  Skin: Skin is warm and dry. No rash noted. He is not diaphoretic.  Psychiatric: He has a normal mood and affect. His behavior is normal. Judgment and thought content normal.  Vitals reviewed.         Assessment & Plan:  Normal health maintenance exam  Essential hypertension-stable history of bilateral renal cysts.  Stable at 140/90 on 3 drug regimen  Nocturia-does not tolerate Flomax.  History of BPH.  History of lumbar disc surgery  Occasional back pain treated with Zanaflex  Controlled type 2 diabetes mellitus with stable hemoglobin A1c  BMI 36.94  Plan: Continue same  medications and return in 6 months.  Work on diet and getting a bit more exercise outside of work.  Small quantity of Norco to take for severe back pain.  Needs diabetic eye exam.

## 2017-06-26 LAB — LIPID PANEL
Cholesterol: 141 mg/dL (ref <200)
HDL: 47 mg/dL (ref >40)
LDL Cholesterol (Calc): 80 mg/dL (calc)
NON-HDL CHOLESTEROL (CALC): 94 mg/dL (ref <130)
Total CHOL/HDL Ratio: 3 (calc) (ref <5.0)
Triglycerides: 64 mg/dL (ref <150)

## 2017-06-26 LAB — CBC WITH DIFFERENTIAL/PLATELET
BASOS ABS: 21 {cells}/uL (ref 0–200)
BASOS PCT: 0.6 %
EOS ABS: 98 {cells}/uL (ref 15–500)
Eosinophils Relative: 2.8 %
HCT: 45.5 % (ref 38.5–50.0)
Hemoglobin: 15.7 g/dL (ref 13.2–17.1)
Lymphs Abs: 1866 cells/uL (ref 850–3900)
MCH: 28.9 pg (ref 27.0–33.0)
MCHC: 34.5 g/dL (ref 32.0–36.0)
MCV: 83.8 fL (ref 80.0–100.0)
MONOS PCT: 11.7 %
MPV: 9.4 fL (ref 7.5–12.5)
NEUTROS PCT: 31.6 %
Neutro Abs: 1106 cells/uL — ABNORMAL LOW (ref 1500–7800)
PLATELETS: 217 10*3/uL (ref 140–400)
RBC: 5.43 10*6/uL (ref 4.20–5.80)
RDW: 13.1 % (ref 11.0–15.0)
TOTAL LYMPHOCYTE: 53.3 %
WBC: 3.5 10*3/uL — ABNORMAL LOW (ref 3.8–10.8)
WBCMIX: 410 {cells}/uL (ref 200–950)

## 2017-06-26 LAB — COMPLETE METABOLIC PANEL WITH GFR
AG Ratio: 1.6 (calc) (ref 1.0–2.5)
ALT: 15 U/L (ref 9–46)
AST: 15 U/L (ref 10–35)
Albumin: 4.2 g/dL (ref 3.6–5.1)
Alkaline phosphatase (APISO): 67 U/L (ref 40–115)
BUN: 17 mg/dL (ref 7–25)
CALCIUM: 9.1 mg/dL (ref 8.6–10.3)
CO2: 28 mmol/L (ref 20–32)
CREATININE: 0.85 mg/dL (ref 0.70–1.33)
Chloride: 105 mmol/L (ref 98–110)
GFR, EST NON AFRICAN AMERICAN: 99 mL/min/{1.73_m2} (ref >=60)
GFR, Est African American: 115 mL/min/{1.73_m2} (ref >=60)
GLUCOSE: 107 mg/dL — AB (ref 65–99)
Globulin: 2.6 g/dL (calc) (ref 1.9–3.7)
Potassium: 4.2 mmol/L (ref 3.5–5.3)
Sodium: 141 mmol/L (ref 135–146)
Total Bilirubin: 0.4 mg/dL (ref 0.2–1.2)
Total Protein: 6.8 g/dL (ref 6.1–8.1)

## 2017-06-26 LAB — MICROALBUMIN / CREATININE URINE RATIO: CREATININE, URINE: 93 mg/dL (ref 20–320)

## 2017-06-26 LAB — HEMOGLOBIN A1C
EAG (MMOL/L): 6.8 (calc)
HEMOGLOBIN A1C: 5.9 %{Hb} — AB (ref <5.7)
MEAN PLASMA GLUCOSE: 123 (calc)

## 2017-06-26 LAB — PSA: PSA: 0.4 ng/mL (ref <=4.0)

## 2017-06-27 NOTE — Patient Instructions (Addendum)
Need for diabetic eye exam discussed. Continue same meds..  Small  quantity Rx Norco  to  take if needed for severe back pain. Needs to lose weight. Discussed diet and exercise. Not much exercise outside of work.  RTC in 6 months.

## 2017-07-06 ENCOUNTER — Ambulatory Visit: Payer: BC Managed Care – PPO | Admitting: Internal Medicine

## 2017-07-06 ENCOUNTER — Encounter: Payer: Self-pay | Admitting: Internal Medicine

## 2017-07-06 VITALS — BP 122/80 | HR 63 | Temp 98.1°F | Ht 73.0 in | Wt 280.0 lb

## 2017-07-06 DIAGNOSIS — J069 Acute upper respiratory infection, unspecified: Secondary | ICD-10-CM | POA: Diagnosis not present

## 2017-07-06 DIAGNOSIS — R05 Cough: Secondary | ICD-10-CM

## 2017-07-06 DIAGNOSIS — I10 Essential (primary) hypertension: Secondary | ICD-10-CM

## 2017-07-06 DIAGNOSIS — R059 Cough, unspecified: Secondary | ICD-10-CM

## 2017-07-06 DIAGNOSIS — E119 Type 2 diabetes mellitus without complications: Secondary | ICD-10-CM

## 2017-07-06 MED ORDER — AMOXICILLIN 500 MG PO CAPS: 500.0000 mg | ORAL_CAPSULE | Freq: Three times a day (TID) | ORAL | 0 refills | Status: DC

## 2017-07-06 MED ORDER — METHYLPREDNISOLONE ACETATE 80 MG/ML IJ SUSP
80.0000 mg | Freq: Once | INTRAMUSCULAR | Status: AC
  Administered 2017-07-06: 80 mg via INTRAMUSCULAR

## 2017-07-14 ENCOUNTER — Other Ambulatory Visit: Payer: Self-pay | Admitting: Internal Medicine

## 2017-07-22 NOTE — Progress Notes (Signed)
   Subjective:    Patient ID: Chad Gardner, male    DOB: [birth year], 52 y.o.   MRN: [id number]  HPI Patient was here January 21 for routine health exam.  He was healthy at the time.  However has developed respiratory infection with cough and congestion.  Slight scratchy throat.  Has malaise and fatigue.  Does not take flu vaccine.    Review of Systems see above.  He has a history of diabetes mellitus, back pain status post L2-L3 microdiscectomy June 2017 and hypertension     Objective:   Physical Exam Skin warm and dry.  He sounds nasally.  Neck is supple without adenopathy.  Chest clear to auscultation.  He looks fatigued.       Assessment & Plan:  Acute URI  Diabetes mellitus  Hypertension  History of back pain status post discectomy  Plan: Amoxicillin 500 mg 3 times a day for 10 days.  Rest and drink plenty of fluids.  Call if not better in 7-10 days or sooner if worse.

## 2017-07-22 NOTE — Patient Instructions (Signed)
Amoxicillin 500 mg 3 times a day for 10 days.  Rest and drink plenty of fluids. 

## 2017-07-25 LAB — HM DIABETES EYE EXAM

## 2017-08-01 ENCOUNTER — Other Ambulatory Visit: Payer: Self-pay

## 2017-08-01 MED ORDER — GLIPIZIDE ER 2.5 MG PO TB24: ORAL_TABLET | ORAL | 3 refills | Status: DC

## 2017-08-10 ENCOUNTER — Encounter: Payer: Self-pay | Admitting: Internal Medicine

## 2017-08-27 ENCOUNTER — Ambulatory Visit: Payer: BC Managed Care – PPO | Admitting: Internal Medicine

## 2017-08-27 ENCOUNTER — Encounter: Payer: Self-pay | Admitting: Internal Medicine

## 2017-08-27 VITALS — BP 150/90 | HR 65 | Temp 98.1°F | Ht 73.0 in | Wt 280.0 lb

## 2017-08-27 DIAGNOSIS — J069 Acute upper respiratory infection, unspecified: Secondary | ICD-10-CM

## 2017-08-27 DIAGNOSIS — E119 Type 2 diabetes mellitus without complications: Secondary | ICD-10-CM | POA: Diagnosis not present

## 2017-08-27 DIAGNOSIS — I10 Essential (primary) hypertension: Secondary | ICD-10-CM

## 2017-08-27 MED ORDER — AMOXICILLIN 500 MG PO CAPS: 500.0000 mg | ORAL_CAPSULE | Freq: Three times a day (TID) | ORAL | 0 refills | Status: DC

## 2017-08-27 NOTE — Patient Instructions (Signed)
Amoxicillin 500 mg 3 times daily for 10 days

## 2017-08-27 NOTE — Progress Notes (Signed)
   Subjective:    Patient ID: Chad Gardner, male    DOB: [birth year], 52 y.o.   MRN: [id number]  HPI Onset last week about 3 days ago, discolored nasal drainage.  Not sure how he came down with another respiratory infection.  Had similar symptoms in early February that responded to amoxicillin.  Does go to church a fair amount.  No flulike symptoms.  Slight cough and congestion.    Review of Systems see above     Objective:   Physical Exam Skin warm and dry.  Nodes none.  TMs and pharynx are clear.  Neck is supple.  Chest clear to auscultation without rales or wheezing.       Assessment & Plan:  Acute URI  Plan: He has a history of diabetes mellitus.  We will treat him with amoxicillin 500 mg 3 times daily for 10 days.  Rest and drink plenty of fluids.  May take Delsym for cough.

## 2017-11-16 ENCOUNTER — Other Ambulatory Visit: Payer: Self-pay | Admitting: Internal Medicine

## 2017-12-18 ENCOUNTER — Ambulatory Visit: Payer: BC Managed Care – PPO | Admitting: Internal Medicine

## 2017-12-18 ENCOUNTER — Encounter: Payer: Self-pay | Admitting: Internal Medicine

## 2017-12-18 VITALS — BP 120/80 | HR 60 | Ht 73.0 in | Wt 272.0 lb

## 2017-12-18 DIAGNOSIS — E119 Type 2 diabetes mellitus without complications: Secondary | ICD-10-CM | POA: Diagnosis not present

## 2017-12-18 DIAGNOSIS — I10 Essential (primary) hypertension: Secondary | ICD-10-CM | POA: Diagnosis not present

## 2017-12-18 DIAGNOSIS — M545 Low back pain, unspecified: Secondary | ICD-10-CM

## 2017-12-18 DIAGNOSIS — Z9889 Other specified postprocedural states: Secondary | ICD-10-CM

## 2017-12-18 DIAGNOSIS — G8929 Other chronic pain: Secondary | ICD-10-CM

## 2017-12-18 DIAGNOSIS — E669 Obesity, unspecified: Secondary | ICD-10-CM

## 2017-12-18 MED ORDER — METOPROLOL SUCCINATE ER 25 MG PO TB24: 25.0000 mg | ORAL_TABLET | Freq: Every day | ORAL | 1 refills | Status: DC

## 2017-12-18 MED ORDER — HYDROCODONE-ACETAMINOPHEN 10-325 MG PO TABS: 1.0000 | ORAL_TABLET | Freq: Four times a day (QID) | ORAL | 0 refills | Status: DC | PRN

## 2017-12-18 MED ORDER — CYCLOBENZAPRINE HCL 10 MG PO TABS: 10.0000 mg | ORAL_TABLET | Freq: Every day | ORAL | 5 refills | Status: DC

## 2017-12-18 MED ORDER — LOSARTAN POTASSIUM 100 MG PO TABS: 100.0000 mg | ORAL_TABLET | Freq: Every day | ORAL | 1 refills | Status: DC

## 2017-12-18 NOTE — Progress Notes (Signed)
   Subjective:    Patient ID: Chad Gardner, male    DOB: [birth year], 52 y.o.   MRN: [id number]  HPI 52 year old Male for 6 month follow up. Hx HTN well controlled.  History of type 2 diabetes mellitus under excellent control.  Hemoglobin A1c is 5.7%.  Basic metabolic panel is normal.  He takes glipizide, HCTZ, Cozaar 100 mg daily and metoprolol.  He has a history of back pain status post surgery by Dr. Venetia MaxonStern.  He had an L2-L3 microdiscectomy for lumbar disc herniation and radiculopathy June 2017.  He is done well.  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  Occasionally has some back pain.  Has annual diabetic eye exam.  No new complaints.    Review of Systems has some intermittent back pain from time to time when he overexerts himself.  Requesting small prescription for hydrocodone APAP.  We gave him number 20 tablets 10/325 with no refill.     Objective:   Physical Exam  Skin warm and dry.  Nodes none.  Neck is supple without JVD thyromegaly or carotid bruits.  Chest clear.  Cardiac exam regular rate and rhythm.  Extremities without edema.  Pulses are present.  Feet without deformity or ulcers.  Blood pressure excellent at 120/80.  He weighs 272 pounds.  His BMI is 35.89.  He could benefit from losing some weight.  We had a discussion about that today.      Assessment & Plan:  Low back pain status post surgery for lumbar disc with radiculopathy-treated sparingly with hydrocodone APAP and muscle relaxant/ Flexeril  Essential hypertension stable on current regimen  Overweight-needs to lose some weight and get more exercise  Controlled type 2 diabetes mellitus with excellent hemoglobin A1c  Plan: Continue current regimen.  Encourage weight loss.  Follow-up in 6 months for physical exam.

## 2017-12-19 LAB — MICROALBUMIN / CREATININE URINE RATIO: CREATININE, URINE: 75 mg/dL (ref 20–320)

## 2017-12-19 LAB — BASIC METABOLIC PANEL
BUN: 16 mg/dL (ref 7–25)
CO2: 26 mmol/L (ref 20–32)
CREATININE: 0.93 mg/dL (ref 0.70–1.33)
Calcium: 9.4 mg/dL (ref 8.6–10.3)
Chloride: 103 mmol/L (ref 98–110)
Glucose, Bld: 116 mg/dL — ABNORMAL HIGH (ref 65–99)
POTASSIUM: 3.6 mmol/L (ref 3.5–5.3)
SODIUM: 138 mmol/L (ref 135–146)

## 2017-12-19 LAB — HEMOGLOBIN A1C
Hgb A1c MFr Bld: 5.7 % of total Hgb — ABNORMAL HIGH (ref <5.7)
Mean Plasma Glucose: 117 (calc)
eAG (mmol/L): 6.5 (calc)

## 2018-01-02 NOTE — Patient Instructions (Signed)
Norco 10/325 given sparingly with small quantity for back pain if needed.  Has Flexeril on hand if needed.  Continue current medications.  Try to lose weight and get more exercise.  Follow-up in 6 months.

## 2018-03-28 ENCOUNTER — Other Ambulatory Visit: Payer: Self-pay | Admitting: Internal Medicine

## 2018-07-01 ENCOUNTER — Ambulatory Visit (INDEPENDENT_AMBULATORY_CARE_PROVIDER_SITE_OTHER): Payer: BC Managed Care – PPO | Admitting: Internal Medicine

## 2018-07-01 ENCOUNTER — Encounter: Payer: Self-pay | Admitting: Internal Medicine

## 2018-07-01 ENCOUNTER — Other Ambulatory Visit: Payer: BC Managed Care – PPO | Admitting: Internal Medicine

## 2018-07-01 VITALS — BP 110/80 | HR 78 | Temp 98.6°F | Ht 73.0 in | Wt 268.0 lb

## 2018-07-01 DIAGNOSIS — R351 Nocturia: Secondary | ICD-10-CM

## 2018-07-01 DIAGNOSIS — I10 Essential (primary) hypertension: Secondary | ICD-10-CM

## 2018-07-01 DIAGNOSIS — Z0000 Encounter for general adult medical examination without abnormal findings: Secondary | ICD-10-CM

## 2018-07-01 DIAGNOSIS — E119 Type 2 diabetes mellitus without complications: Secondary | ICD-10-CM

## 2018-07-01 DIAGNOSIS — N401 Enlarged prostate with lower urinary tract symptoms: Secondary | ICD-10-CM

## 2018-07-01 DIAGNOSIS — R350 Frequency of micturition: Secondary | ICD-10-CM

## 2018-07-01 DIAGNOSIS — M654 Radial styloid tenosynovitis [de Quervain]: Secondary | ICD-10-CM

## 2018-07-01 DIAGNOSIS — Z9889 Other specified postprocedural states: Secondary | ICD-10-CM | POA: Diagnosis not present

## 2018-07-01 DIAGNOSIS — E669 Obesity, unspecified: Secondary | ICD-10-CM

## 2018-07-01 LAB — POCT URINALYSIS DIPSTICK
Appearance: NEGATIVE
Bilirubin, UA: NEGATIVE
Blood, UA: NEGATIVE
Glucose, UA: NEGATIVE
KETONES UA: NEGATIVE
Leukocytes, UA: NEGATIVE
NITRITE UA: NEGATIVE
Odor: NEGATIVE
PH UA: 6.5 (ref 5.0–8.0)
PROTEIN UA: NEGATIVE
SPEC GRAV UA: 1.01 (ref 1.010–1.025)
UROBILINOGEN UA: 0.2 U/dL

## 2018-07-02 LAB — COMPLETE METABOLIC PANEL WITH GFR
AG Ratio: 1.7 (calc) (ref 1.0–2.5)
ALBUMIN MSPROF: 4.5 g/dL (ref 3.6–5.1)
ALKALINE PHOSPHATASE (APISO): 63 U/L (ref 40–115)
ALT: 17 U/L (ref 9–46)
AST: 18 U/L (ref 10–35)
BILIRUBIN TOTAL: 0.8 mg/dL (ref 0.2–1.2)
BUN: 15 mg/dL (ref 7–25)
CO2: 27 mmol/L (ref 20–32)
CREATININE: 0.79 mg/dL (ref 0.70–1.33)
Calcium: 9.4 mg/dL (ref 8.6–10.3)
Chloride: 103 mmol/L (ref 98–110)
GFR, EST NON AFRICAN AMERICAN: 102 mL/min/{1.73_m2} (ref >=60)
GFR, Est African American: 118 mL/min/{1.73_m2} (ref >=60)
GLUCOSE: 111 mg/dL — AB (ref 65–99)
Globulin: 2.6 g/dL (calc) (ref 1.9–3.7)
Potassium: 3.9 mmol/L (ref 3.5–5.3)
Sodium: 140 mmol/L (ref 135–146)
TOTAL PROTEIN: 7.1 g/dL (ref 6.1–8.1)

## 2018-07-02 LAB — HEMOGLOBIN A1C
Hgb A1c MFr Bld: 6 % of total Hgb — ABNORMAL HIGH (ref <5.7)
MEAN PLASMA GLUCOSE: 126 (calc)
eAG (mmol/L): 7 (calc)

## 2018-07-02 LAB — LIPID PANEL
CHOL/HDL RATIO: 3 (calc) (ref <5.0)
CHOLESTEROL: 152 mg/dL (ref <200)
HDL: 51 mg/dL (ref >40)
LDL CHOLESTEROL (CALC): 86 mg/dL
NON-HDL CHOLESTEROL (CALC): 101 mg/dL (ref <130)
Triglycerides: 60 mg/dL (ref <150)

## 2018-07-02 LAB — CBC WITH DIFFERENTIAL/PLATELET
Absolute Monocytes: 394 cells/uL (ref 200–950)
Basophils Absolute: 8 cells/uL (ref 0–200)
Basophils Relative: 0.2 %
EOS PCT: 1.7 %
Eosinophils Absolute: 70 cells/uL (ref 15–500)
HCT: 45.9 % (ref 38.5–50.0)
Hemoglobin: 15.6 g/dL (ref 13.2–17.1)
Lymphs Abs: 1915 cells/uL (ref 850–3900)
MCH: 28.9 pg (ref 27.0–33.0)
MCHC: 34 g/dL (ref 32.0–36.0)
MCV: 85 fL (ref 80.0–100.0)
MPV: 9.5 fL (ref 7.5–12.5)
Monocytes Relative: 9.6 %
NEUTROS PCT: 41.8 %
Neutro Abs: 1714 cells/uL (ref 1500–7800)
PLATELETS: 219 10*3/uL (ref 140–400)
RBC: 5.4 10*6/uL (ref 4.20–5.80)
RDW: 13.1 % (ref 11.0–15.0)
TOTAL LYMPHOCYTE: 46.7 %
WBC: 4.1 10*3/uL (ref 3.8–10.8)

## 2018-07-02 LAB — PSA: PSA: 0.3 ng/mL (ref <=4.0)

## 2018-07-02 NOTE — Progress Notes (Signed)
Subjective:    Patient ID: Chad Gardner, male    DOB: 12-Feb-1966, 53 y.o.   MRN: [id number]  HPI Pleasant 53 year old black male in today for health maintenance exam and evaluation of medical issues.  He has had some issues with urinary frequency and rare urinary incontinence if he drinks a lot of liquids.  He does not have dribbling and does not have much nocturia.  He has a history of impaired glucose tolerance but hemoglobin A1c is excellent at 6%.  He has a history of bilateral renal cyst.  History of mild enlargement of prostate.  He was tried on tamsulosin in 2017 but he quit taking it.  He says he could not tolerate it.  History of erectile dysfunction but not currently sexually active.  History of chronic stenosing tenosynovitis (de Quervain's stenosing tenosynovitis (first dorsal compartment left wrist repaired by Dr. Teressa Senter in 2011.  He is now having similar issues affecting his right thumb.  He will be referred to Dr. Merlyn Lot.  He gets his exercise mostly at work along with house and yard work.  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  Ophthalmologist is Dr. Alden Hipp.  Patient reminded about annual diabetic eye exam.  He wears bifocals.  Social history: He is a Scientist, product/process development.  He and his wife are separated.  She lives in [place] and travels for work.  They have no children.  He has a son in college from a previous relationship.  Does not smoke.  Drinks alcohol socially.  Family history: Father with history of prostate cancer still living.  Patient says mother has history of stroke and hypertension.  He has 2 brothers, one brother as well and the other brother has schizophrenia and hypertension.  One sister overweight.  History of microdiscectomy L2-L3 for lumbar disc herniation with radiculopathy 2017 by Dr. Venetia Maxon.  Still has some back stiffness.    Review of Systems  Constitutional: Negative.   HENT: Negative.   Respiratory: Negative.   Cardiovascular:  Negative.   Gastrointestinal: Negative.   Neurological: Negative.   Psychiatric/Behavioral: Negative.         Objective:   Physical Exam Vitals signs reviewed.  Constitutional:      Appearance: Normal appearance.  HENT:     Head: Normocephalic and atraumatic.     Right Ear: Tympanic membrane normal.     Left Ear: Tympanic membrane normal.     Nose: Nose normal.     Mouth/Throat:     Mouth: Mucous membranes are moist.     Pharynx: Oropharynx is clear.  Eyes:     General: No scleral icterus.       Right eye: No discharge.        Left eye: No discharge.     Conjunctiva/sclera: Conjunctivae normal.     Pupils: Pupils are equal, round, and reactive to light.  Neck:     Musculoskeletal: Neck supple. No neck rigidity.  Cardiovascular:     Rate and Rhythm: Normal rate and regular rhythm.     Pulses: Normal pulses.     Heart sounds: No murmur.  Genitourinary:    Comments: Prostate slightly enlarged symmetrical without nodules Musculoskeletal:        General: No deformity.  Skin:    General: Skin is warm and dry.  Neurological:     General: No focal deficit present.     Mental Status: He is alert and oriented to person, place, and time.  Sensory: No sensory deficit.     Motor: No weakness.  Psychiatric:        Mood and Affect: Mood normal.        Behavior: Behavior normal.        Thought Content: Thought content normal.        Judgment: Judgment normal.           Assessment & Plan:  Controlled type 2 diabetes mellitus with excellent hemoglobin A1c treated with glipizide  Essential hypertension-stable on current regimen of HCTZ and losartan as well as metoprolol  Low back pain/stiffness status post lumbar discectomy 2017  Urinary frequency-did not tolerate Flomax-we will see urologist  ?  Tommi Rumpse Quervain's tenosynovitis right wrist to see Dr. Merlyn LotKuzma.  Had similar issue left wrist treated surgically by Dr. Teressa SenterSypher  Return in 6 months.

## 2018-07-02 NOTE — Patient Instructions (Addendum)
To see Dr. Merlyn Lot regarding de Quervain's right wrist and thumb.  To see urologist regarding urinary frequency.  Continue same medications.  I am pleased with diabetic control and hypertension control.  Needs to diet exercise and lose some weight.  Follow-up in 6 months.  Declines flu vaccine.

## 2018-07-07 ENCOUNTER — Other Ambulatory Visit: Payer: Self-pay | Admitting: Internal Medicine

## 2018-08-10 ENCOUNTER — Other Ambulatory Visit: Payer: Self-pay | Admitting: Internal Medicine

## 2018-08-15 ENCOUNTER — Other Ambulatory Visit: Payer: Self-pay | Admitting: Internal Medicine

## 2018-10-07 ENCOUNTER — Other Ambulatory Visit: Payer: Self-pay | Admitting: Internal Medicine

## 2018-12-13 ENCOUNTER — Ambulatory Visit: Payer: BC Managed Care – PPO | Admitting: Internal Medicine

## 2018-12-13 ENCOUNTER — Telehealth: Payer: Self-pay | Admitting: Internal Medicine

## 2018-12-13 ENCOUNTER — Other Ambulatory Visit: Payer: Self-pay

## 2018-12-13 VITALS — BP 120/80 | HR 78

## 2018-12-13 DIAGNOSIS — R21 Rash and other nonspecific skin eruption: Secondary | ICD-10-CM | POA: Diagnosis not present

## 2018-12-13 MED ORDER — TRIAMCINOLONE ACETONIDE 0.1 % EX CREA: TOPICAL_CREAM | CUTANEOUS | 0 refills | Status: DC

## 2018-12-13 MED ORDER — METHYLPREDNISOLONE ACETATE 80 MG/ML IJ SUSP
80.0000 mg | Freq: Once | INTRAMUSCULAR | Status: AC
  Administered 2018-12-13: 80 mg via INTRAMUSCULAR

## 2018-12-13 MED ORDER — HYDROCODONE-ACETAMINOPHEN 10-325 MG PO TABS: 1.0000 | ORAL_TABLET | Freq: Four times a day (QID) | ORAL | 0 refills | Status: DC | PRN

## 2018-12-13 NOTE — Telephone Encounter (Signed)
Parke Jandreau [phone]  Ho called to say that he started breaking out yesterday with a rash on his left side and both of his thighs and today it is worse has started on right side. On Wednesday he was cutting some bushes and then burned them, thought it might could be coming from that. He would like to come in this afternoon between 4-5 for you to look at it, if possible, he is afraid it might get a lot worse over the weekend. He looked in his medicine cabinet and said he had some ointment that he might have used in the past. (Mupirocin)

## 2018-12-13 NOTE — Telephone Encounter (Signed)
Appointment scheduled.

## 2018-12-13 NOTE — Telephone Encounter (Signed)
OK work him in

## 2018-12-23 ENCOUNTER — Other Ambulatory Visit: Payer: Self-pay

## 2018-12-23 ENCOUNTER — Encounter: Payer: Self-pay | Admitting: Internal Medicine

## 2018-12-23 ENCOUNTER — Ambulatory Visit: Payer: BC Managed Care – PPO | Admitting: Internal Medicine

## 2018-12-23 VITALS — BP 120/80 | HR 60 | Temp 98.6°F | Ht 73.0 in | Wt 272.0 lb

## 2018-12-23 DIAGNOSIS — N401 Enlarged prostate with lower urinary tract symptoms: Secondary | ICD-10-CM

## 2018-12-23 DIAGNOSIS — Z5181 Encounter for therapeutic drug level monitoring: Secondary | ICD-10-CM | POA: Diagnosis not present

## 2018-12-23 DIAGNOSIS — E119 Type 2 diabetes mellitus without complications: Secondary | ICD-10-CM

## 2018-12-23 DIAGNOSIS — M545 Low back pain, unspecified: Secondary | ICD-10-CM

## 2018-12-23 DIAGNOSIS — I10 Essential (primary) hypertension: Secondary | ICD-10-CM | POA: Diagnosis not present

## 2018-12-23 DIAGNOSIS — G8929 Other chronic pain: Secondary | ICD-10-CM

## 2018-12-23 DIAGNOSIS — Z9889 Other specified postprocedural states: Secondary | ICD-10-CM

## 2018-12-23 DIAGNOSIS — R351 Nocturia: Secondary | ICD-10-CM

## 2018-12-23 DIAGNOSIS — Z79899 Other long term (current) drug therapy: Secondary | ICD-10-CM

## 2018-12-23 NOTE — Progress Notes (Signed)
   Subjective:    Patient ID: Chad Gardner, male    DOB: Sep 20, 1965, 53 y.o.   MRN: [id number]  HPI 53 year old Male for 6 month follow up.  History of chronic low back pain with intermittent intensity status post microdiscectomy L2-L3 for lumbar disc herniation with radiculopathy 2017 by Dr. Vertell Limber.  History of impaired glucose tolerance.  History of urinary frequency and hesitancy.  Not much nocturia.  Now on Myrbetriq per urologist.  He did not do well with tamsulosin due to side effects.  Reminded about annual diabetic eye exam.  History of hypertension and bilateral renal cysts.  He works in Theatre manager at [employer].  Has not been furloughed during the pandemic.   Review of Systems stiff back frequently takes Aleve with relief     Objective:   Physical Exam Blood pressure 120/80.  Skin warm and dry.  Nodes none.  Chest clear.  Cardiac exam regular rate and rhythm.  Extremities without edema.  Affect is normal.       Assessment & Plan:  Type 2 diabetes mellitus treated with glipizide  Hypertension treated with losartan and HCTZ as well as metoprolol  Prostatism treated with Myrbetriq  Chronic recurrent back pain treated with over-the-counter anxiety  Plan: B- met drawn today along with hemoglobin A1c and urine specimen sent for microalbumin.  Return in 6 months.  Will need CPE at that time.

## 2018-12-23 NOTE — Patient Instructions (Signed)
Continue diet exercise and weight loss efforts.  May take Aleve for back pain.  Continue antihypertensive medications and Glucotrol.  Follow-up in 6 months.  Hemoglobin A1c pending.  Have annual flu vaccine.

## 2018-12-24 LAB — BASIC METABOLIC PANEL
BUN: 18 mg/dL (ref 7–25)
CO2: 28 mmol/L (ref 20–32)
Calcium: 9.2 mg/dL (ref 8.6–10.3)
Chloride: 103 mmol/L (ref 98–110)
Creat: 0.86 mg/dL (ref 0.70–1.33)
Glucose, Bld: 118 mg/dL — ABNORMAL HIGH (ref 65–99)
Potassium: 3.7 mmol/L (ref 3.5–5.3)
Sodium: 140 mmol/L (ref 135–146)

## 2018-12-24 LAB — MICROALBUMIN / CREATININE URINE RATIO
Creatinine, Urine: 144 mg/dL (ref 20–320)
Microalb Creat Ratio: 1 mcg/mg creat (ref <30)
Microalb, Ur: 0.2 mg/dL

## 2018-12-24 LAB — HEMOGLOBIN A1C
Hgb A1c MFr Bld: 6 % of total Hgb — ABNORMAL HIGH (ref <5.7)
Mean Plasma Glucose: 126 (calc)
eAG (mmol/L): 7 (calc)

## 2018-12-29 ENCOUNTER — Encounter: Payer: Self-pay | Admitting: Internal Medicine

## 2018-12-29 NOTE — Patient Instructions (Signed)
Depo-Medrol 80 mg IM.  It should help with itching.  Triamcinolone cream to chigger bites 3 times a day.  Hydrocodone APAP 10/325 #20 tablets to take sparingly for recurrent low back pain with exertion.

## 2018-12-29 NOTE — Progress Notes (Signed)
   Subjective:    Patient ID: Paulanthony Gleaves, male    DOB: 06/04/1966, 53 y.o.   MRN: [id number]  HPI 53 year old Male in with rash on trunk and lower extremities.  He was burning some brush that contain fluid.  Subsequently developed itchy papular lesions.  No fever or chills.  Does not think he was a real poison ivy.  Has chronic recurrent low back pain status post lumbar disc surgery.  Exertion such as cleaning up brush causes his back to hurt.  He says urologist now has him on Myrbetriq for prostatism and is doing well for him.  Review of Systems see above     Objective:   Physical Exam Straight leg raising is negative at 90 degrees bilaterally.  He has multiple papular erythematous lesions that are not compatible with mosquito bites.  These are on his right trunk  and right thigh area.       Assessment & Plan:  Chiggers  Chronic recurrent low back pain  Plan: Explained to him there is not really good treatment for triggers but we can try triamcinolone cream 0.1% topically 3 times a day until lesions resolved.  For back pain he will be given hydrocodone APAP 10/325 to take sparingly every 6 hours # 20 tablets.  Depo-Medrol 80 mg IM to help with itching.

## 2019-01-02 ENCOUNTER — Other Ambulatory Visit: Payer: Self-pay | Admitting: Internal Medicine

## 2019-02-10 ENCOUNTER — Other Ambulatory Visit: Payer: Self-pay | Admitting: Internal Medicine

## 2019-02-13 ENCOUNTER — Other Ambulatory Visit: Payer: Self-pay | Admitting: Internal Medicine

## 2019-02-24 LAB — HM DIABETES EYE EXAM

## 2019-03-20 ENCOUNTER — Other Ambulatory Visit: Payer: Self-pay | Admitting: Internal Medicine

## 2019-05-07 ENCOUNTER — Other Ambulatory Visit: Payer: Self-pay | Admitting: Internal Medicine

## 2019-07-04 ENCOUNTER — Other Ambulatory Visit: Payer: Self-pay

## 2019-07-04 ENCOUNTER — Ambulatory Visit: Payer: BC Managed Care – PPO | Admitting: Internal Medicine

## 2019-07-04 ENCOUNTER — Encounter: Payer: Self-pay | Admitting: Internal Medicine

## 2019-07-04 ENCOUNTER — Other Ambulatory Visit: Payer: BC Managed Care – PPO | Admitting: Internal Medicine

## 2019-07-04 VITALS — BP 102/70 | HR 62 | Temp 98.0°F | Ht 73.0 in | Wt 282.0 lb

## 2019-07-04 DIAGNOSIS — I10 Essential (primary) hypertension: Secondary | ICD-10-CM

## 2019-07-04 DIAGNOSIS — Z9889 Other specified postprocedural states: Secondary | ICD-10-CM

## 2019-07-04 DIAGNOSIS — M545 Low back pain, unspecified: Secondary | ICD-10-CM

## 2019-07-04 DIAGNOSIS — N401 Enlarged prostate with lower urinary tract symptoms: Secondary | ICD-10-CM

## 2019-07-04 DIAGNOSIS — G8929 Other chronic pain: Secondary | ICD-10-CM

## 2019-07-04 DIAGNOSIS — Z6837 Body mass index (BMI) 37.0-37.9, adult: Secondary | ICD-10-CM

## 2019-07-04 DIAGNOSIS — Z0000 Encounter for general adult medical examination without abnormal findings: Secondary | ICD-10-CM

## 2019-07-04 DIAGNOSIS — R351 Nocturia: Secondary | ICD-10-CM

## 2019-07-04 DIAGNOSIS — E119 Type 2 diabetes mellitus without complications: Secondary | ICD-10-CM

## 2019-07-04 DIAGNOSIS — Z125 Encounter for screening for malignant neoplasm of prostate: Secondary | ICD-10-CM

## 2019-07-04 DIAGNOSIS — N281 Cyst of kidney, acquired: Secondary | ICD-10-CM

## 2019-07-04 DIAGNOSIS — M654 Radial styloid tenosynovitis [de Quervain]: Secondary | ICD-10-CM

## 2019-07-04 LAB — POCT URINALYSIS DIPSTICK
Appearance: NEGATIVE
Bilirubin, UA: NEGATIVE
Blood, UA: NEGATIVE
Glucose, UA: NEGATIVE
Ketones, UA: NEGATIVE
Leukocytes, UA: NEGATIVE
Nitrite, UA: NEGATIVE
Odor: NEGATIVE
Protein, UA: NEGATIVE
Spec Grav, UA: 1.015 (ref 1.010–1.025)
Urobilinogen, UA: 0.2 E.U./dL
pH, UA: 6.5 (ref 5.0–8.0)

## 2019-07-04 MED ORDER — HYDROCODONE-ACETAMINOPHEN 10-325 MG PO TABS: 1.0000 | ORAL_TABLET | Freq: Four times a day (QID) | ORAL | 0 refills | Status: DC | PRN

## 2019-07-04 NOTE — Patient Instructions (Addendum)
It was a pleasure to see you today. Let's try to lose some weight by walking and watching diet. Labs drawn and pending. Take Covid-19 vaccine when available. RTC in 6 months. Norco 10/325 prescribed for back pain on prn basis if needed. #15 tabs.

## 2019-07-04 NOTE — Progress Notes (Signed)
Subjective:    Patient ID: Chad Gardner, male    DOB: May 29, 1966, 54 y.o.   MRN: [id number]  HPI  Pleasant 54 year old Male in today for health maintenance exam medical issues.  He has a history of urinary frequency and has seen urologist.  History of erectile dysfunction but currently not sexually active.  History of impaired glucose tolerance.  Mild enlargement of prostate.  Tried tamsulosin in 2017 but could not tolerate it.  Not dribbling and not much nocturia.  History of microdiscectomy L2-L3 4 lumbar disc herniation with radiculopathy in 2017 by Dr. Venetia Maxon.  Has not been walking all that much.  Has gained 10 pounds in the past few months.  Ophthalmologist is Dr. Dione Booze.  Reminded about annual diabetic eye exam.  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  Resides alone.  Separated from wife.  He has a son in graduate school from a previous relationship that is 56 years old.  Patient does not smoke.  Drinks beer socially when watching sports.  Has a couple beers on the weekends.  Family history: Father with history of prostate cancer living.  Patient says mother has history of stroke and hypertension.  He has 2 brothers.  1 brother alive and well and the other brother has schizophrenia and hypertension.  1 sister overweight.  History of chronic stenosing tenosynovitis repaired by Dr. Teressa Senter in 2011.  Labs drawn today and pending.    Review of Systems musculoskeletal pain.  With flexion of Rayna Sexton and Tylenol on a as needed basis.  Sometimes he takes hydrocodone APAP 10/325 1/2 tablet sparingly when his back is hurting him.     Objective:   Physical Exam Vitals reviewed.  Constitutional:      Appearance: Normal appearance. He is obese. He is not diaphoretic.  HENT:     Head: Normocephalic and atraumatic.     Right Ear: Tympanic membrane normal.     Left Ear: Tympanic membrane normal.     Nose: Nose normal.  Eyes:     General: No scleral icterus.       Right eye: No  discharge.        Left eye: No discharge.     Extraocular Movements: Extraocular movements intact.     Conjunctiva/sclera: Conjunctivae normal.     Pupils: Pupils are equal, round, and reactive to light.  Neck:     Vascular: No carotid bruit.  Cardiovascular:     Rate and Rhythm: Normal rate and regular rhythm.     Heart sounds: Normal heart sounds. No murmur.  Pulmonary:     Effort: No respiratory distress.     Breath sounds: Normal breath sounds. No wheezing.  Abdominal:     General: Bowel sounds are normal.     Palpations: Abdomen is soft.  Musculoskeletal:     Cervical back: Neck supple. No rigidity.     Right lower leg: No edema.     Left lower leg: No edema.  Lymphadenopathy:     Cervical: No cervical adenopathy.  Skin:    General: Skin is warm and dry.     Findings: No lesion or rash.  Neurological:     General: No focal deficit present.     Mental Status: He is alert and oriented to person, place, and time.     Cranial Nerves: No cranial nerve deficit.     Sensory: No sensory deficit.     Motor: No weakness.  Coordination: Coordination normal.  Psychiatric:        Mood and Affect: Mood normal.        Behavior: Behavior normal.        Thought Content: Thought content normal.        Judgment: Judgment normal.    Blood pressure 102/70 pulse 62 temperature 98 degrees pulse oximetry 98% weight 292 pounds BMI 37.21       Assessment & Plan:  Essential hypertension-stable on current regimen  Type 2 diabetes mellitus-hemoglobin A1c: Pending  BMI 37.21-weight 292 pounds.  Has gained weight over the past few months.  Encourage diet exercise and weight loss.  Chronic midline back pain-prescribed 15 tablets of Norco 10/325 to use sparingly if severe back pain.  Otherwise take Tylenol if needed for back pain.  Urinary frequency-did not tolerate Flomax for oxybutynin due to side effects  History of renal cysts-followed by urologist with ultrasound  Trigger finger  right thumb seen by Dr. Leanora Cover Feb 2020. Advised to take anti-inflammatory med for [facility] arthritis prn  Plan: Diet and exercise. Review labs drawn today RTC 6 months. Take pain med sparingly.

## 2019-07-05 LAB — CBC WITH DIFFERENTIAL/PLATELET
Absolute Monocytes: 361 cells/uL (ref 200–950)
Basophils Absolute: 11 cells/uL (ref 0–200)
Basophils Relative: 0.3 %
Eosinophils Absolute: 81 cells/uL (ref 15–500)
Eosinophils Relative: 2.3 %
HCT: 46.5 % (ref 38.5–50.0)
Hemoglobin: 15.7 g/dL (ref 13.2–17.1)
Lymphs Abs: 1820 cells/uL (ref 850–3900)
MCH: 28.8 pg (ref 27.0–33.0)
MCHC: 33.8 g/dL (ref 32.0–36.0)
MCV: 85.2 fL (ref 80.0–100.0)
MPV: 9.6 fL (ref 7.5–12.5)
Monocytes Relative: 10.3 %
Neutro Abs: 1229 cells/uL — ABNORMAL LOW (ref 1500–7800)
Neutrophils Relative %: 35.1 %
Platelets: 223 10*3/uL (ref 140–400)
RBC: 5.46 10*6/uL (ref 4.20–5.80)
RDW: 12.9 % (ref 11.0–15.0)
Total Lymphocyte: 52 %
WBC: 3.5 10*3/uL — ABNORMAL LOW (ref 3.8–10.8)

## 2019-07-05 LAB — COMPLETE METABOLIC PANEL WITH GFR
AG Ratio: 1.6 (calc) (ref 1.0–2.5)
ALT: 19 U/L (ref 9–46)
AST: 19 U/L (ref 10–35)
Albumin: 4.2 g/dL (ref 3.6–5.1)
Alkaline phosphatase (APISO): 58 U/L (ref 35–144)
BUN: 22 mg/dL (ref 7–25)
CO2: 26 mmol/L (ref 20–32)
Calcium: 9 mg/dL (ref 8.6–10.3)
Chloride: 102 mmol/L (ref 98–110)
Creat: 0.91 mg/dL (ref 0.70–1.33)
GFR, Est African American: 110 mL/min/{1.73_m2} (ref >=60)
GFR, Est Non African American: 95 mL/min/{1.73_m2} (ref >=60)
Globulin: 2.6 g/dL (calc) (ref 1.9–3.7)
Glucose, Bld: 127 mg/dL — ABNORMAL HIGH (ref 65–99)
Potassium: 4.1 mmol/L (ref 3.5–5.3)
Sodium: 138 mmol/L (ref 135–146)
Total Bilirubin: 0.6 mg/dL (ref 0.2–1.2)
Total Protein: 6.8 g/dL (ref 6.1–8.1)

## 2019-07-05 LAB — LIPID PANEL
Cholesterol: 154 mg/dL (ref <200)
HDL: 44 mg/dL (ref >=40)
LDL Cholesterol (Calc): 95 mg/dL (calc)
Non-HDL Cholesterol (Calc): 110 mg/dL (calc) (ref <130)
Total CHOL/HDL Ratio: 3.5 (calc) (ref <5.0)
Triglycerides: 68 mg/dL (ref <150)

## 2019-07-05 LAB — HEMOGLOBIN A1C
Hgb A1c MFr Bld: 7 % of total Hgb — ABNORMAL HIGH (ref <5.7)
Mean Plasma Glucose: 154 (calc)
eAG (mmol/L): 8.5 (calc)

## 2019-07-05 LAB — PSA: PSA: 0.4 ng/mL (ref <=4.0)

## 2019-07-14 ENCOUNTER — Encounter: Payer: Self-pay | Admitting: Internal Medicine

## 2019-07-14 ENCOUNTER — Other Ambulatory Visit: Payer: Self-pay

## 2019-07-14 ENCOUNTER — Telehealth: Payer: Self-pay | Admitting: Internal Medicine

## 2019-07-14 ENCOUNTER — Ambulatory Visit: Payer: BC Managed Care – PPO | Admitting: Internal Medicine

## 2019-07-14 VITALS — BP 100/68 | HR 68 | Temp 98.1°F | Ht 73.0 in | Wt 282.0 lb

## 2019-07-14 DIAGNOSIS — M7502 Adhesive capsulitis of left shoulder: Secondary | ICD-10-CM

## 2019-07-14 DIAGNOSIS — M25512 Pain in left shoulder: Secondary | ICD-10-CM | POA: Diagnosis not present

## 2019-07-14 MED ORDER — MELOXICAM 15 MG PO TABS: 15.0000 mg | ORAL_TABLET | Freq: Every day | ORAL | 0 refills | Status: DC

## 2019-07-14 NOTE — Telephone Encounter (Signed)
scheduled

## 2019-07-14 NOTE — Telephone Encounter (Signed)
Chad Gardner [phone]  [name] called to say for the last 3 days he has been having severe pain in his shoulder, he has not been able to raise it and it is keeping him up at night, unable to get comfortable. No fever, No COVID exposure that he knows of, no symptoms

## 2019-07-14 NOTE — Telephone Encounter (Signed)
See today

## 2019-07-14 NOTE — Progress Notes (Signed)
   Subjective:    Patient ID: Chad Gardner, male    DOB: [birth year], 54 y.o.   MRN: [id number]  HPI 54 year old Male with left shoulder started late last week. Works as an Personnel officer. Has decreased range of motion left upper extremity. No known injury. Had one previous episode a couple of weeks ago that improved. He has been using Voltaren gel with some relief. He is worried about rheumatoid arthritis or lupus. Says several joints bother him. He is worried he may not be able to work a few more years before retiring. Has no swollen inflamed joints.  He has a history of impaired glucose tolerance. Had microdisectomy L2-L3 for lumbar disc herniation 2017 by Dr. Venetia Maxon.  Hx of HTN and BPH  Drinks alcohol socially.  Had DeQuervain's stenosing tenosynovitis left wrist repaired by Dr. Teressa Senter 2011.  Review of Systems see above     Objective:   Physical Exam  VS reviewed. Patient is anxious. Decreased ROM left upper extremity. No weakness. Pain mid bicep area.      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 He has lots of questions about proper treatment and work issues. He will see orthopedist tomorrow to see if an injection of steroid is appropriate and to see if PT is appropriate.Prescribed Meloxicam 15 mg daily.Check uric acid,CCP,ANA.CBC with diff,sed rate.ROM exercises demonstrated.

## 2019-07-15 LAB — CBC WITH DIFFERENTIAL/PLATELET
Absolute Monocytes: 640 cells/uL (ref 200–950)
Basophils Absolute: 21 cells/uL (ref 0–200)
Basophils Relative: 0.4 %
Eosinophils Absolute: 140 cells/uL (ref 15–500)
Eosinophils Relative: 2.7 %
HCT: 45 % (ref 38.5–50.0)
Hemoglobin: 15.2 g/dL (ref 13.2–17.1)
Lymphs Abs: 2324 cells/uL (ref 850–3900)
MCH: 28.4 pg (ref 27.0–33.0)
MCHC: 33.8 g/dL (ref 32.0–36.0)
MCV: 84 fL (ref 80.0–100.0)
MPV: 9.7 fL (ref 7.5–12.5)
Monocytes Relative: 12.3 %
Neutro Abs: 2075 cells/uL (ref 1500–7800)
Neutrophils Relative %: 39.9 %
Platelets: 226 10*3/uL (ref 140–400)
RBC: 5.36 10*6/uL (ref 4.20–5.80)
RDW: 12.9 % (ref 11.0–15.0)
Total Lymphocyte: 44.7 %
WBC: 5.2 10*3/uL (ref 3.8–10.8)

## 2019-07-15 LAB — URIC ACID: Uric Acid, Serum: 5.7 mg/dL (ref 4.0–8.0)

## 2019-07-15 LAB — SEDIMENTATION RATE: Sed Rate: 17 mm/h (ref 0–20)

## 2019-07-15 LAB — ANA: Anti Nuclear Antibody (ANA): NEGATIVE

## 2019-07-15 LAB — CYCLIC CITRUL PEPTIDE ANTIBODY, IGG: Cyclic Citrullin Peptide Ab: <16 UNITS

## 2019-07-15 NOTE — Patient Instructions (Signed)
Labs drawn and pending ROM exercises explained. Meloxicam 15 mg daily. See orthopedist tomorrow.

## 2019-08-04 ENCOUNTER — Other Ambulatory Visit: Payer: Self-pay | Admitting: Orthopedic Surgery

## 2019-08-04 DIAGNOSIS — R52 Pain, unspecified: Secondary | ICD-10-CM

## 2019-08-06 ENCOUNTER — Other Ambulatory Visit: Payer: Self-pay | Admitting: Internal Medicine

## 2019-08-16 ENCOUNTER — Ambulatory Visit
Admission: RE | Admit: 2019-08-16 | Discharge: 2019-08-16 | Disposition: A | Discharge status: Home / Self Care | Payer: BC Managed Care – PPO | Source: Ambulatory Visit | Attending: Orthopedic Surgery | Admitting: Orthopedic Surgery

## 2019-08-16 ENCOUNTER — Other Ambulatory Visit: Payer: Self-pay

## 2019-08-16 DIAGNOSIS — R52 Pain, unspecified: Secondary | ICD-10-CM

## 2019-08-20 ENCOUNTER — Telehealth: Payer: Self-pay | Admitting: Internal Medicine

## 2019-08-20 NOTE — Telephone Encounter (Signed)
Chad Gardner [phone]  Chad Gardner called to say for the last 3-4 weeks his blood sugars has been averaging around 170 when he gets up in the mornings and he has been feeling sluggish and run down.Patient stated that he is taking his medication as prescribed and is wandering if increasing it would help lower sugar. He has lost 4 lbs since last visit.

## 2019-08-20 NOTE — Telephone Encounter (Signed)
Called and read to patient what Dr Lenord Fellers said, he verbalized understanding.

## 2019-08-20 NOTE — Telephone Encounter (Signed)
He had his AIC checked not that long ago and it was 7% which was not all that bad. He did have a steroid injection in his shoulder which sometimes can raise blood sugar but that was several weeks ago. I do not think the sluggishness is due to the blood sugar. It is simply not that high. Can see in April if not improving.

## 2019-08-30 ENCOUNTER — Other Ambulatory Visit: Payer: BC Managed Care – PPO

## 2019-09-24 HISTORY — PX: SHOULDER ARTHROSCOPY: SHX128

## 2019-12-01 ENCOUNTER — Telehealth: Payer: Self-pay | Admitting: Internal Medicine

## 2019-12-01 ENCOUNTER — Other Ambulatory Visit: Payer: Self-pay

## 2019-12-01 ENCOUNTER — Ambulatory Visit: Payer: BC Managed Care – PPO | Admitting: Internal Medicine

## 2019-12-01 ENCOUNTER — Encounter: Payer: Self-pay | Admitting: Internal Medicine

## 2019-12-01 VITALS — BP 100/80 | HR 80 | Ht 73.0 in | Wt 272.0 lb

## 2019-12-01 DIAGNOSIS — I10 Essential (primary) hypertension: Secondary | ICD-10-CM

## 2019-12-01 DIAGNOSIS — E119 Type 2 diabetes mellitus without complications: Secondary | ICD-10-CM

## 2019-12-01 DIAGNOSIS — M25512 Pain in left shoulder: Secondary | ICD-10-CM

## 2019-12-01 DIAGNOSIS — R7309 Other abnormal glucose: Secondary | ICD-10-CM

## 2019-12-01 LAB — POCT GLUCOSE (DEVICE FOR HOME USE): POC Glucose: 196 mg/dl — AB (ref 70–99)

## 2019-12-01 MED ORDER — GLIPIZIDE 5 MG PO TABS: 5.0000 mg | ORAL_TABLET | Freq: Every day | ORAL | 0 refills | Status: DC

## 2019-12-01 NOTE — Telephone Encounter (Signed)
Scheduled at 2:45pm.  

## 2019-12-01 NOTE — Telephone Encounter (Signed)
Chad Gardner [phone]  [name] called to say for the last week his blood sugars has been over 200, last week it was 270, since then he has quit taking pain medicine and it has been running 214,204. He seen his ortho doctor this morning and he did not think pain medicine would have any thing to do with elevating blood sugar and he advised to call PCP.

## 2019-12-01 NOTE — Patient Instructions (Signed)
Patient to increase Glucotrol XL to 5 mg daily and follow-up here in about 4 weeks.  He is to call in a couple weeks with progress report.  He will need hemoglobin A1c in a few weeks.  His blood pressure stable on current regimen.  I have reassured him that he is likely to improve with continued physical therapy with regard to his shoulder pain given a bit more time.

## 2019-12-01 NOTE — Progress Notes (Signed)
   Subjective:    Patient ID: Chad Gardner, male    DOB: December 24, 1965, 54 y.o.   MRN: [id number]  HPI 54 year old Male seen for elevated Accu-Cheks for several days.  About 10 weeks ago he had surgery left shoulder at an outpatient surgical center by Dr. Madelon Lips.  He is going to physical therapy.  Additionally seen by Dr. Madelon Lips in February it was thought he had a frozen shoulder with or without cuff pathology.  He was given an injection in his shoulder and did not improve.  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  He has been trying to eat correctly.  In January, Hemoglobin A1c was 7%.  Has been in a fair amount of pain from left shoulder surgery.  Has been on glipizide XL 2.5 mg daily.  We are going to increase that to 5 mg daily and he will monitor Accu-Cheks over the next several days.  He has been asked to walk regularly, and watch his diet.  He will need a hemoglobin A1c in the next few weeks once we get his glucose under control    Review of Systems see above     Objective:   Physical Exam  Weight is 272 pounds. Was 282 pounds February.  Blood pressure 100/80, pulse 80, pulse oximetry 98%.  BMI 35.89 Skin warm and dry.  Seen in office today in no acute distress but anxious about elevated Accu-Chek readings.     Assessment & Plan:  Elevated Accu-Chek readings-likely related to combination of caloric intake, inactivity, anxiety, pain and stress.  Previously had good control with Glucotrol XL 2.5 mg daily.  He has no apparent issue with infection.  Plan: He will increase  Glucotrol XL to 5 mg daily and call us in the next couple of weeks with progress report.  He needs an A1c in the next 6 weeks or so.  Essential hypertension stable on current regimen

## 2019-12-01 NOTE — Telephone Encounter (Signed)
Bring list of glucose readings and be seen today

## 2019-12-24 ENCOUNTER — Other Ambulatory Visit: Payer: Self-pay | Admitting: Internal Medicine

## 2019-12-26 ENCOUNTER — Other Ambulatory Visit: Payer: Self-pay

## 2019-12-26 MED ORDER — METOPROLOL SUCCINATE ER 25 MG PO TB24: 25.0000 mg | ORAL_TABLET | Freq: Every day | ORAL | 1 refills | Status: DC

## 2019-12-26 NOTE — Telephone Encounter (Signed)
Chad Gardner called about this refill, could you check on it please

## 2020-01-02 ENCOUNTER — Ambulatory Visit: Payer: BC Managed Care – PPO | Admitting: Internal Medicine

## 2020-01-02 ENCOUNTER — Other Ambulatory Visit: Payer: Self-pay

## 2020-01-02 ENCOUNTER — Encounter: Payer: Self-pay | Admitting: Internal Medicine

## 2020-01-02 VITALS — BP 110/80 | HR 62 | Ht 73.0 in | Wt 271.0 lb

## 2020-01-02 DIAGNOSIS — M67922 Unspecified disorder of synovium and tendon, left upper arm: Secondary | ICD-10-CM | POA: Diagnosis not present

## 2020-01-02 DIAGNOSIS — I10 Essential (primary) hypertension: Secondary | ICD-10-CM

## 2020-01-02 DIAGNOSIS — E119 Type 2 diabetes mellitus without complications: Secondary | ICD-10-CM

## 2020-01-02 DIAGNOSIS — F411 Generalized anxiety disorder: Secondary | ICD-10-CM

## 2020-01-02 DIAGNOSIS — M19012 Primary osteoarthritis, left shoulder: Secondary | ICD-10-CM

## 2020-01-02 LAB — POCT GLUCOSE (DEVICE FOR HOME USE): Glucose Fasting, POC: 192 mg/dL — AB (ref 70–99)

## 2020-01-02 MED ORDER — ALPRAZOLAM 0.25 MG PO TABS: 0.2500 mg | ORAL_TABLET | Freq: Two times a day (BID) | ORAL | 0 refills | Status: DC | PRN

## 2020-01-02 NOTE — Progress Notes (Signed)
   Subjective:    Patient ID: Chad Gardner, male    DOB: [birth year], 54 y.o.   MRN: [id number]  HPI  54 year old Male for follow up on Diabetes mellitus.  He remains out of work but Dr. 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  Also had subacromial/subdeltoid fluid compatible with bursitis.  At last visit in June glipizide was increased from 2.5 mg XL daily to 5 mg XL daily.  He brings in his Accu-Chek device and device which corrects his finger for blood sugar testing.  He says these are old and he would like to have new ones.  New order was written for him today.  He is anxious today.  I think he is fearful that his shoulder will not return to 100% and he may have difficulty returning to his job.  He has trouble entertaining himself while he is out of work.  He resides alone.  Is thought about taking a trip down to [place] to see relatives and friends.  Blood pressure is stable on current regimen consisting of HCTZ, losartan 100 mg daily, metoprolol XL 25 mg daily.  For diabetes he is just on glipizide 5 mg daily.  Review of Systems see above     Objective:   Physical Exam Blood pressure 110/80, pulse 62, pulse oximetry 97% BMI of 35.75, weight 271 pounds Skin warm and dry.  No carotid bruits.  Chest clear to auscultation.  Cardiac exam regular rate and rhythm normal S1 and S2.  Accu-Chek here in office today is 192.  Hemoglobin A1c drawn and pending.      Assessment & Plan:  Anxiety state-he is at home currently recovering from left shoulder surgery and I think he is getting bored.  He would like to be fully recovered and back at work.  He continues to go to PT.  Since he is so anxious I have prescribed Xanax 0.25 mg twice daily as needed for him.  Essential hypertension-stable on  current regimen and under good control  BMI 35.75-needs to work on diet exercise and weight loss.  Walking regularly with help.  Status post left shoulder surgery by Dr. Madelon Lips.  Had biceps tendinopathy and degeneration and fraying of superior labrum as well as AC joint arthritis.

## 2020-01-02 NOTE — Patient Instructions (Signed)
He will get new diabetic test strips and lancets to check Accu-Cheks twice daily. I have given him low-dose Xanax 0.25 mg to take up to twice daily as needed for anxiety. Hemoglobin A1c drawn and pending today.

## 2020-01-03 LAB — HEMOGLOBIN A1C
Hgb A1c MFr Bld: 8.4 % of total Hgb — ABNORMAL HIGH (ref <5.7)
Mean Plasma Glucose: 194 (calc)
eAG (mmol/L): 10.8 (calc)

## 2020-01-07 ENCOUNTER — Other Ambulatory Visit: Payer: Self-pay

## 2020-01-07 MED ORDER — GLIPIZIDE ER 10 MG PO TB24: 10.0000 mg | ORAL_TABLET | Freq: Every day | ORAL | 0 refills | Status: DC

## 2020-02-16 ENCOUNTER — Other Ambulatory Visit: Payer: Self-pay

## 2020-02-16 ENCOUNTER — Other Ambulatory Visit: Payer: BC Managed Care – PPO | Admitting: Internal Medicine

## 2020-02-16 DIAGNOSIS — E119 Type 2 diabetes mellitus without complications: Secondary | ICD-10-CM

## 2020-02-17 LAB — HEMOGLOBIN A1C
Hgb A1c MFr Bld: 8.3 % of total Hgb — ABNORMAL HIGH (ref <5.7)
Mean Plasma Glucose: 192 (calc)
eAG (mmol/L): 10.6 (calc)

## 2020-02-19 ENCOUNTER — Encounter: Payer: Self-pay | Admitting: Internal Medicine

## 2020-02-19 ENCOUNTER — Other Ambulatory Visit: Payer: Self-pay

## 2020-02-19 ENCOUNTER — Ambulatory Visit: Payer: BC Managed Care – PPO | Admitting: Internal Medicine

## 2020-02-19 VITALS — BP 102/80 | HR 70 | Ht 73.0 in | Wt 272.0 lb

## 2020-02-19 DIAGNOSIS — E119 Type 2 diabetes mellitus without complications: Secondary | ICD-10-CM | POA: Diagnosis not present

## 2020-02-19 DIAGNOSIS — I10 Essential (primary) hypertension: Secondary | ICD-10-CM

## 2020-02-19 MED ORDER — SITAGLIPTIN PHOSPHATE 100 MG PO TABS: 100.0000 mg | ORAL_TABLET | Freq: Every day | ORAL | 2 refills | Status: DC

## 2020-02-25 ENCOUNTER — Encounter: Payer: Self-pay | Admitting: Internal Medicine

## 2020-02-25 LAB — HM DIABETES EYE EXAM

## 2020-03-01 ENCOUNTER — Encounter: Payer: Self-pay | Admitting: Internal Medicine

## 2020-03-13 ENCOUNTER — Other Ambulatory Visit: Payer: Self-pay | Admitting: Internal Medicine

## 2020-03-28 NOTE — Patient Instructions (Addendum)
You should begin to see a difference having added Januvia to your current diabetic regimen Glucotrol XL 10 mg daily.  Watch diet and get plenty of exercise.  Return here in January 2022.

## 2020-03-28 NOTE — Progress Notes (Signed)
° °  Subjective:    Patient ID: Chad Gardner, male    DOB: 1966/02/11, 54 y.o.   MRN: [id number]  HPI Dr. Madelon Lips has released him to get back to work without restrictions on October 4.  He is 5 months status post left shoulder arthroscopy with biceps tenodesis.  He had long head of biceps showing tendinopathy and mild to moderate AC joint arthritis, degeneration and fraying of the superior labrum.  He has a history of type 2 diabetes mellitus.  In June, glipizide was increased for better diabetic control from 2.5 mg XL daily to 5 mg XL daily.  He was last seen here in July.  Blood pressure stable on HCTZ, losartan and metoprolol.  Is to have diabetic [facility] exam [facility] September 22  Review of Systems see above no new complaints     Objective:   Physical Exam Blood pressure is excellent 102/80 weight is 272 pounds and BMI is 35.89.  Neck is supple.  Chest clear.  Cardiac exam regular rate and rhythm.  No carotid bruits.  No lower extremity edema.       Assessment & Plan:  Type 2 diabetes mellitus-has been on Glucotrol XL 10 mg daily.  We are going to add Januvia 100 mg daily as hemoglobin A1c 8.3% and was 8.4% in late July.  He will continue to monitor blood pressure at home.  He has physical exam scheduled for January 2022.  Watch diet and try to get plenty of exercise.  He will call me is his blood sugar remains elevated despite adding Januvia.  We should see some improvement within a week or so.  Hypertension-stable on current regimen of losartan and metoprolol as well as HCTZ.  History of BPH treated with Flomax.  History of anxiety treating sparingly with Xanax 0.25 mg up to twice daily as needed.  Continue to watch diet and try to get a bit more exercise.  Follow-up in January 2022.  Let me know if blood sugars are not under good control prior to January.

## 2020-04-16 IMAGING — MR MR SHOULDER*R* W/O CM
5 series · 35 of 40 positions shown · non-contrast
Comparison: None.

CLINICAL DATA: Bilateral shoulder pain and limited range of motion
for 3 months, left worse than right. No known injury.

EXAM:
MRI OF THE RIGHT SHOULDER WITHOUT CONTRAST
TECHNIQUE: Multiplanar, multisequence MR imaging of the shoulder was performed.
No intravenous contrast was administered.

[Series 5: PD fat-sat · axial · 4.0mm · 0.62mm/px · z∈[-3,+79]mm · 8 of 20 slices shown (1 of 2)]
[im 1/20]
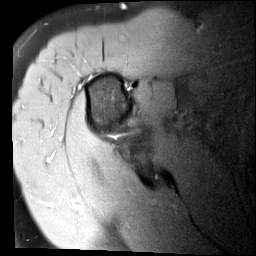
[im 3/20]
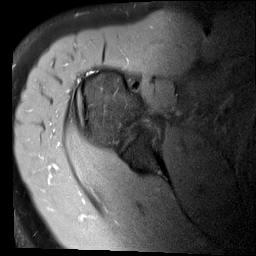
[im 6/20]
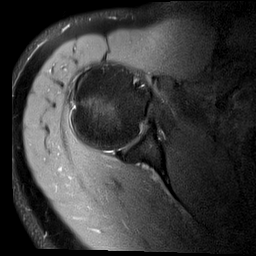
[im 9/20]
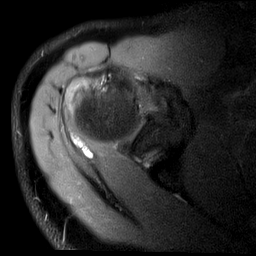
[im 11/20]
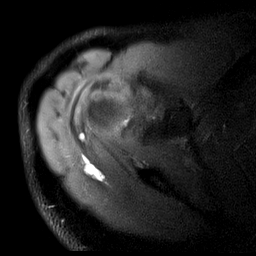
[im 14/20]
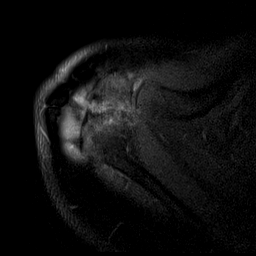
[im 17/20]
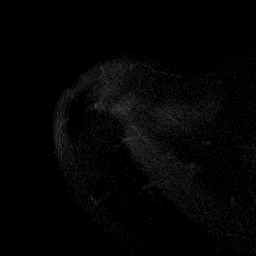
[im 20/20]
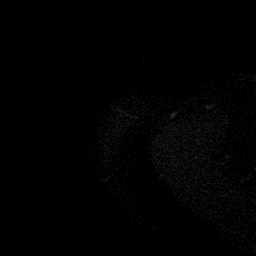

[Series 6: T2 fat-sat · oblique · 4.0mm · 0.62mm/px · 8 of 20 slices shown (1 of 2)]
[im 1/20]
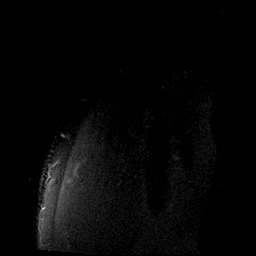
[im 3/20]
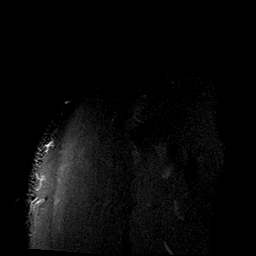
[im 6/20]
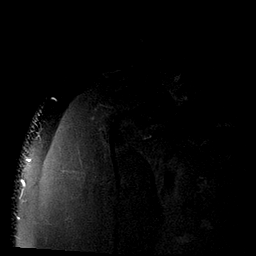
[im 9/20]
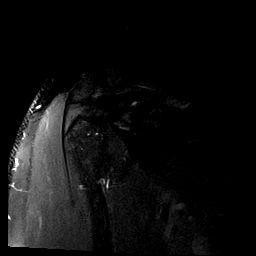
[im 11/20]
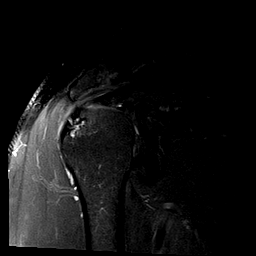
[im 14/20]
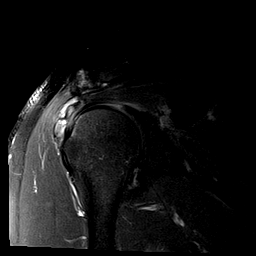
[im 17/20]
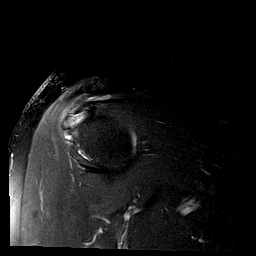
[im 20/20]
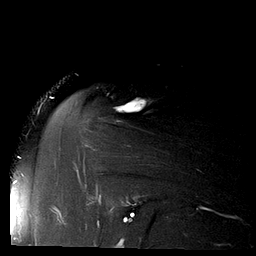

[Series 7: PD fat-sat · oblique · 4.0mm · 0.31mm/px · 8 of 20 slices shown (2 of 2)]
[im 1/20]
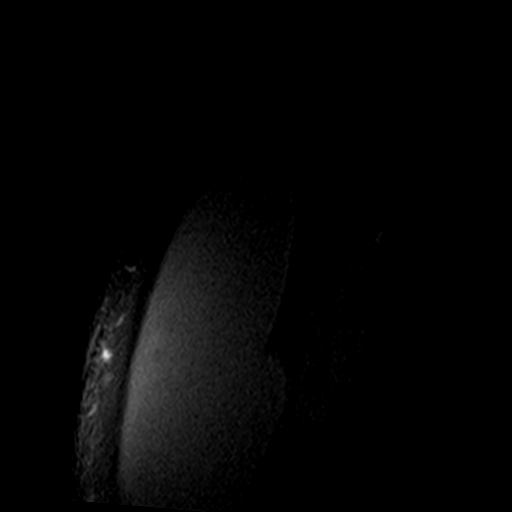
[im 3/20]
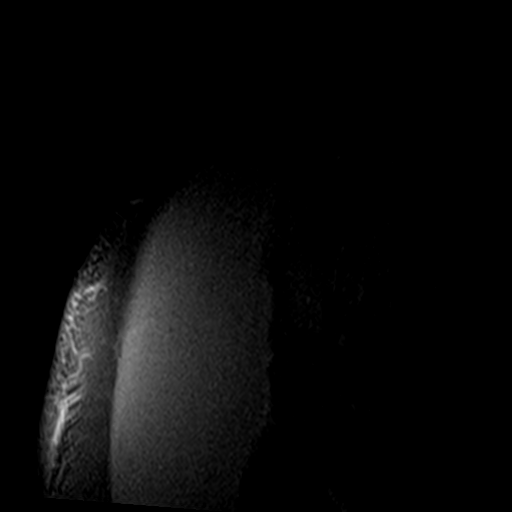
[im 6/20]
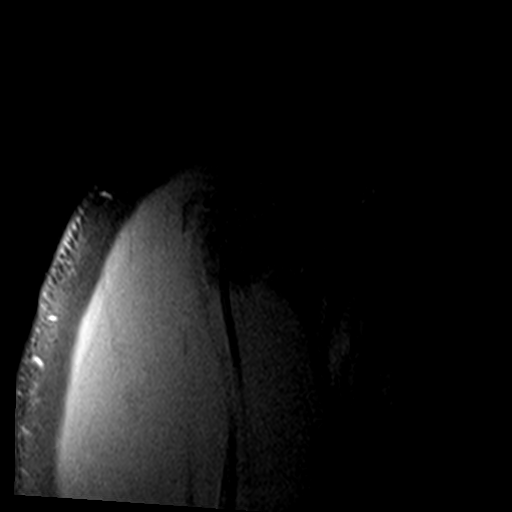
[im 9/20]
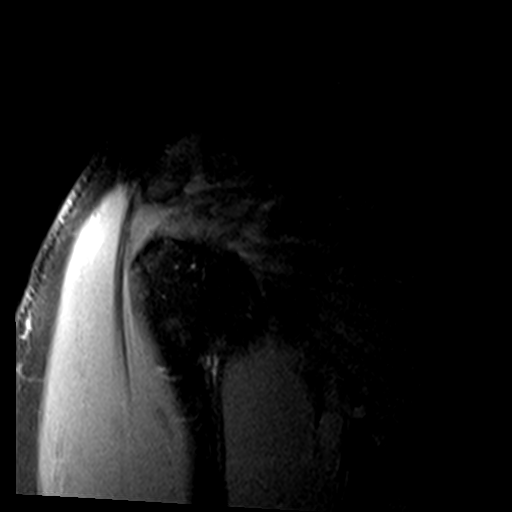
[im 11/20]
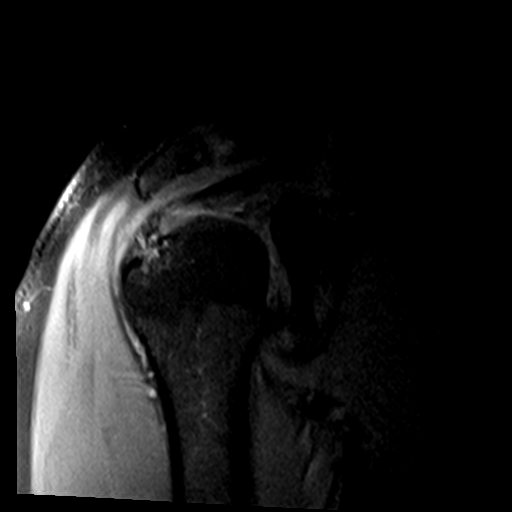
[im 14/20]
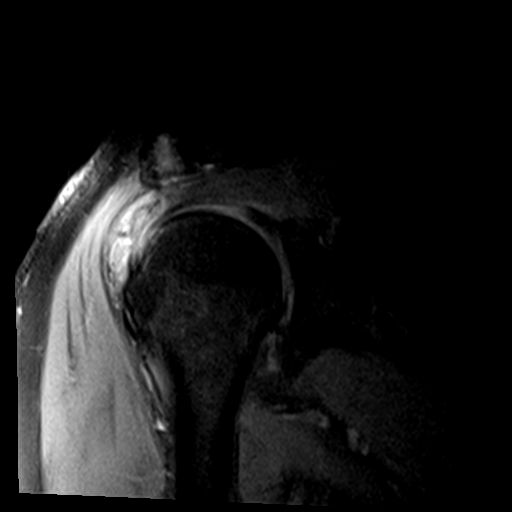
[im 17/20]
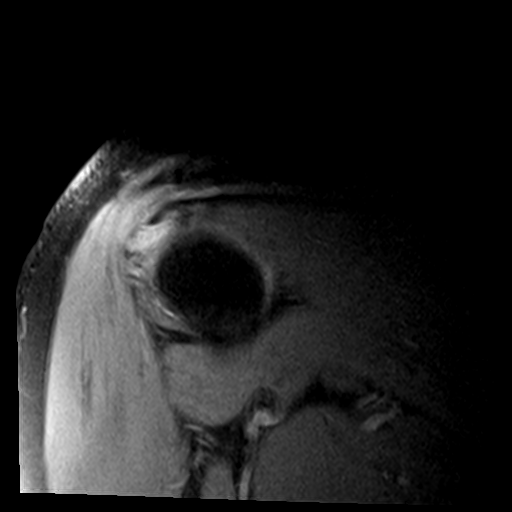
[im 20/20]
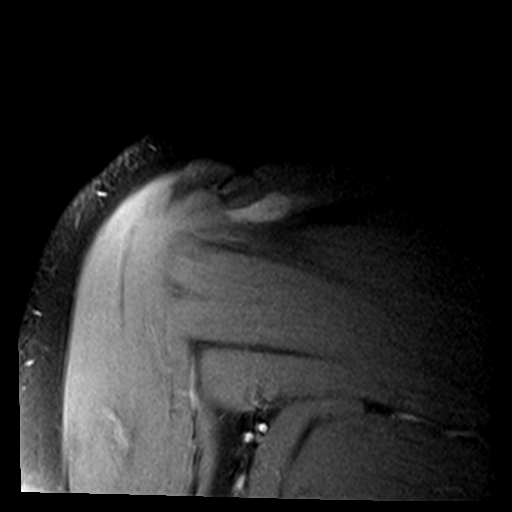

[Series 8: T1 · oblique · 4.0mm · 0.31mm/px · 3 of 22 slices shown]
[im 1/22]
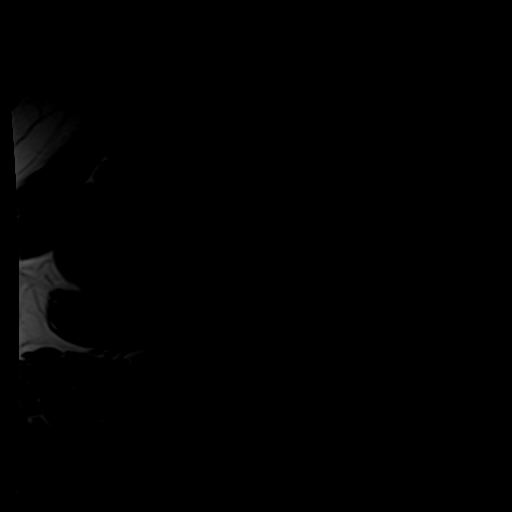
[im 4/22]
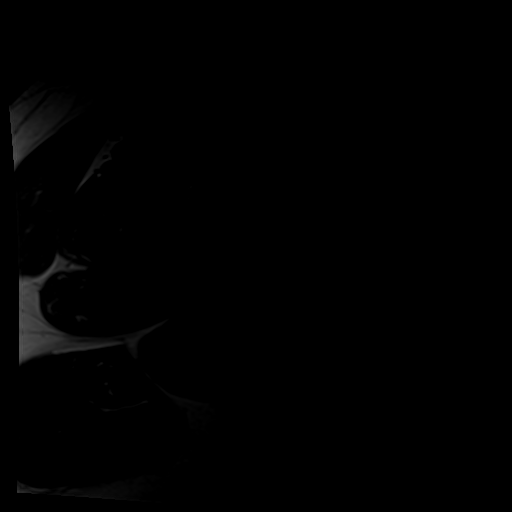
[im 7/22]
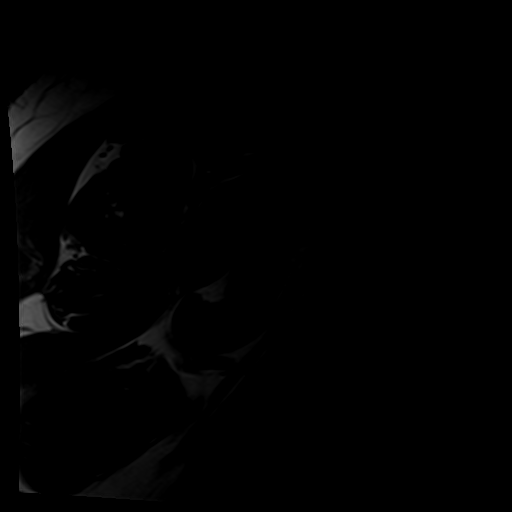

[Series 9: T2 fat-sat · oblique · 4.0mm · 0.62mm/px · 8 of 22 slices shown (2 of 2)]
[im 1/22]
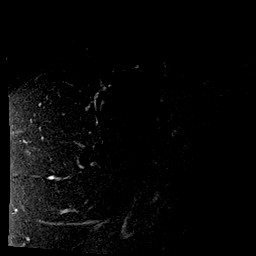
[im 4/22]
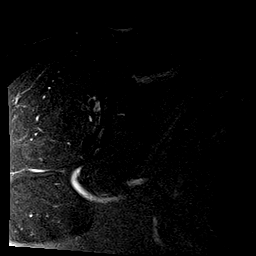
[im 7/22]
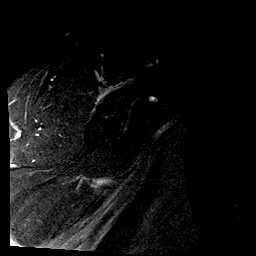
[im 10/22]
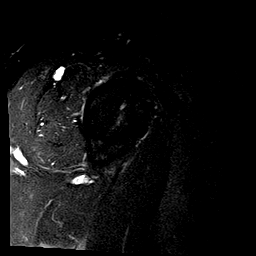
[im 13/22]
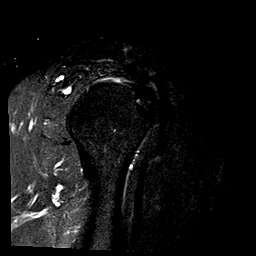
[im 16/22]
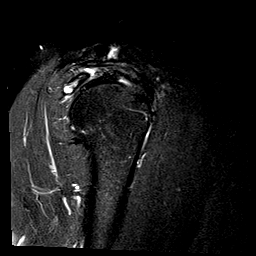
[im 19/22]
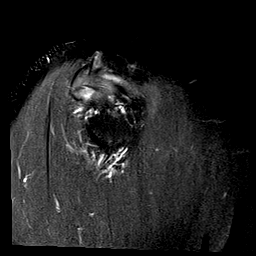
[im 22/22]
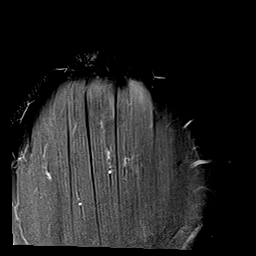

[35 of 40 positions shown; findings below may reference images not displayed]

FINDINGS: Rotator cuff: There is marked thickening and heterogeneously
increased T2 signal in the supraspinatus and infraspinatus tendons
consistent with severe tendinopathy. Interstitial tearing is seen in
the posterior supraspinatus and anterior infraspinatus. No
full-thickness tear or tendon retraction is identified.

Muscles:  Normal without atrophy or focal lesion.

Biceps long head:  Intact and normal in appearance.

Acromioclavicular Joint: Moderate to moderately severe
osteoarthritis. Type 2 acromion. The patient has a meso-acromion
type os acromiale. Mild degenerative change is present about the
synchondrosis. There is a small volume of fluid in the
subacromial/subdeltoid bursa.

Glenohumeral Joint: Minimal degenerative change is seen.

Labrum: The superior labrum is degenerated and frayed. No discrete
tear is identified.

Bones:  No fracture, contusion or worrisome lesion.

Other: None.
IMPRESSION: Severe supraspinatus and infraspinatus tendinopathy with
interstitial tears of the posterior supraspinatus and anterior
infraspinatus. Negative for tendon retraction or muscle atrophy.

Moderate to moderately severe acromioclavicular osteoarthritis.

Type 2 acromion. Meso-acromion type os acromiale with mild
degenerative change about the synchondrosis also noted.

Subacromial/subdeltoid bursitis.

Degenerated superior labrum without discrete tear identified.

## 2020-04-18 ENCOUNTER — Other Ambulatory Visit: Payer: Self-pay | Admitting: Internal Medicine

## 2020-05-05 ENCOUNTER — Other Ambulatory Visit: Payer: Self-pay | Admitting: Internal Medicine

## 2020-05-24 ENCOUNTER — Other Ambulatory Visit: Payer: Self-pay | Admitting: Internal Medicine

## 2020-07-05 ENCOUNTER — Other Ambulatory Visit: Payer: BC Managed Care – PPO | Admitting: Internal Medicine

## 2020-07-05 ENCOUNTER — Ambulatory Visit (INDEPENDENT_AMBULATORY_CARE_PROVIDER_SITE_OTHER): Payer: BC Managed Care – PPO | Admitting: Internal Medicine

## 2020-07-05 ENCOUNTER — Encounter: Payer: Self-pay | Admitting: Internal Medicine

## 2020-07-05 ENCOUNTER — Telehealth: Payer: Self-pay | Admitting: Internal Medicine

## 2020-07-05 ENCOUNTER — Other Ambulatory Visit: Payer: Self-pay

## 2020-07-05 VITALS — BP 102/72 | HR 63 | Ht 73.0 in | Wt 273.0 lb

## 2020-07-05 DIAGNOSIS — M545 Low back pain, unspecified: Secondary | ICD-10-CM | POA: Diagnosis not present

## 2020-07-05 DIAGNOSIS — Z0000 Encounter for general adult medical examination without abnormal findings: Secondary | ICD-10-CM

## 2020-07-05 DIAGNOSIS — E119 Type 2 diabetes mellitus without complications: Secondary | ICD-10-CM

## 2020-07-05 DIAGNOSIS — G8929 Other chronic pain: Secondary | ICD-10-CM

## 2020-07-05 DIAGNOSIS — R351 Nocturia: Secondary | ICD-10-CM

## 2020-07-05 DIAGNOSIS — Z23 Encounter for immunization: Secondary | ICD-10-CM

## 2020-07-05 DIAGNOSIS — M19012 Primary osteoarthritis, left shoulder: Secondary | ICD-10-CM

## 2020-07-05 DIAGNOSIS — M25512 Pain in left shoulder: Secondary | ICD-10-CM | POA: Diagnosis not present

## 2020-07-05 DIAGNOSIS — F411 Generalized anxiety disorder: Secondary | ICD-10-CM

## 2020-07-05 DIAGNOSIS — N401 Enlarged prostate with lower urinary tract symptoms: Secondary | ICD-10-CM

## 2020-07-05 DIAGNOSIS — I10 Essential (primary) hypertension: Secondary | ICD-10-CM

## 2020-07-05 LAB — POCT URINALYSIS DIPSTICK
Appearance: NEGATIVE
Bilirubin, UA: NEGATIVE
Blood, UA: NEGATIVE
Glucose, UA: NEGATIVE
Ketones, UA: NEGATIVE
Leukocytes, UA: NEGATIVE
Nitrite, UA: NEGATIVE
Odor: NEGATIVE
Protein, UA: NEGATIVE
Spec Grav, UA: 1.01 (ref 1.010–1.025)
Urobilinogen, UA: 0.2 E.U./dL
pH, UA: 6.5 (ref 5.0–8.0)

## 2020-07-05 NOTE — Telephone Encounter (Signed)
Next Colonoscopy is due 07/2024 per Dr Jolee Ewing [facility] office

## 2020-07-05 NOTE — Patient Instructions (Addendum)
Tdap updated. He cannot recall one given at [facility] in 2017. Labs drawn and pending with further instructions to follow. RTC in 6 months with  Hgb AIC to be drawn at time of visit. Colonoscopy is up to date.

## 2020-07-05 NOTE — Progress Notes (Signed)
Subjective:    Patient ID: Chad Gardner, male    DOB: 06/27/1965, 55 y.o.   MRN: [id number]  HPI 55 year old Male for health maintenance exam and evaluation of medical issues.  History of urinary frequency and has seen urologist.  History of erectile dysfunction but not sexually active.   Mild enlargement of prostate.  Has been prescribed tamsulosin.  Not much nocturia.  History of type 2 diabetes mellitus.  Hemoglobin A1c in September was 8.3%.  At that time he was less active because of shoulder issues.  Now has gone back to work full-time so hopefully this will be better.  Fasting labs are drawn today.  History of essential hypertension under good control.  History of microdiscectomy L2-L3 for lumbar disc herniation with radiculopathy in 2017 by Dr. Venetia Maxon.  Tries to watch his weight.  Ophthalmologist is Dr. Dione Booze.  Reminded about annual diabetic eye exam.  Social history: 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  Resides alone.  Separated from wife.  He has a son working in Audiological scientist in [place] who just graduated with a Event organiser.  Patient does not smoke.  Drinks beer socially when watching sports.  Does not drink any excess and recently has not been drinking at all.  Family history: Father with history of prostate cancer, living.  Mother with history of stroke and hypertension.  They reside here in [place] and he tries to take care of them.  He has 2 brothers.  1 brother alive and well and the other brother has schizophrenia and hypertension.  1 sister overweight.  History of chronic stenosing tenosynovitis repaired by Dr. Teressa Senter in 2011.  Regarding colonoscopy: Had 3 benign polyps removed 2016 by Dr. Loreta Ave.  We called Dr. Kenna Gilbert office and he is not due until 2026 for repeat colonoscopy.  Review of Systems Has lost 9 pounds in past year. He sleeps fairly well, but  sometimes has dreams. His work is difficult. He had left shoulder arthroscopy and biceps tenodesis.   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  This is tiresome and is difficult with his shoulder pain.  He also has low back pain that is persistent status post lumbar surgery several years ago.  He tries to do a good job at work.  Recently they had been shoveling snow and it was difficult because of his back pain and shoulder pain.  Hopefully that will not be the case in the next few weeks as Spring is coming.     Objective:   Physical Exam Blood pressure 102/72, pulse 63, pulse oximetry 97% weight 273 pounds height 6 feet 1 inches BMI 36.02  Skin is warm and dry.  No cervical adenopathy.  No thyromegaly.  No carotid bruits.  Chest is clear to auscultation without rales or wheezing.  Cardiac exam: Regular rate and rhythm normal S1 and S2 without murmurs.  Abdomen is obese soft nondistended without hepatosplenomegaly masses or tenderness.  Prostate is normal without nodules.  No lower extremity pitting edema.  Neuro: Intact without focal deficits.  Affect thought and judgment are normal.       Assessment & Plan:  History of lumbar discectomy L2-L3 with some residual low back pain with overexertion at work  Left shoulder arthroscopy surgery by Dr. Madelon Lips with biceps tenodesis  Type 2 diabetes mellitus-currently on oral medications  History of anxiety treated with  Xanax sparingly.  Essential hypertension treated with losartan and metoprolol as well as HCTZ.  Plan: Fasting labs drawn and pending.  Will call patient with results and discuss if further medical management is necessary.  His colonoscopy is up-to-date.  He has had 3 COVID vaccines.  He was given a tetanus immunization update today.  He does not recall getting a Tdap at [facility] in 2017 as listed in his chart.  Return in 6 months.

## 2020-07-06 LAB — COMPLETE METABOLIC PANEL WITH GFR
AG Ratio: 1.4 (calc) (ref 1.0–2.5)
ALT: 17 U/L (ref 9–46)
AST: 17 U/L (ref 10–35)
Albumin: 4.2 g/dL (ref 3.6–5.1)
Alkaline phosphatase (APISO): 67 U/L (ref 35–144)
BUN: 20 mg/dL (ref 7–25)
CO2: 28 mmol/L (ref 20–32)
Calcium: 9.3 mg/dL (ref 8.6–10.3)
Chloride: 103 mmol/L (ref 98–110)
Creat: 0.93 mg/dL (ref 0.70–1.33)
GFR, Est African American: 106 mL/min/{1.73_m2} (ref >=60)
GFR, Est Non African American: 91 mL/min/{1.73_m2} (ref >=60)
Globulin: 2.9 g/dL (calc) (ref 1.9–3.7)
Glucose, Bld: 132 mg/dL — ABNORMAL HIGH (ref 65–99)
Potassium: 4.1 mmol/L (ref 3.5–5.3)
Sodium: 138 mmol/L (ref 135–146)
Total Bilirubin: 0.6 mg/dL (ref 0.2–1.2)
Total Protein: 7.1 g/dL (ref 6.1–8.1)

## 2020-07-06 LAB — CBC WITH DIFFERENTIAL/PLATELET
Absolute Monocytes: 456 cells/uL (ref 200–950)
Basophils Absolute: 9 cells/uL (ref 0–200)
Basophils Relative: 0.2 %
Eosinophils Absolute: 108 cells/uL (ref 15–500)
Eosinophils Relative: 2.5 %
HCT: 47.6 % (ref 38.5–50.0)
Hemoglobin: 15.9 g/dL (ref 13.2–17.1)
Lymphs Abs: 1780 cells/uL (ref 850–3900)
MCH: 28.2 pg (ref 27.0–33.0)
MCHC: 33.4 g/dL (ref 32.0–36.0)
MCV: 84.5 fL (ref 80.0–100.0)
MPV: 9.6 fL (ref 7.5–12.5)
Monocytes Relative: 10.6 %
Neutro Abs: 1948 cells/uL (ref 1500–7800)
Neutrophils Relative %: 45.3 %
Platelets: 220 10*3/uL (ref 140–400)
RBC: 5.63 10*6/uL (ref 4.20–5.80)
RDW: 13.5 % (ref 11.0–15.0)
Total Lymphocyte: 41.4 %
WBC: 4.3 10*3/uL (ref 3.8–10.8)

## 2020-07-06 LAB — MICROALBUMIN / CREATININE URINE RATIO
Creatinine, Urine: 219 mg/dL (ref 20–320)
Microalb Creat Ratio: 3 mcg/mg creat (ref <30)
Microalb, Ur: 0.6 mg/dL

## 2020-07-06 LAB — HEMOGLOBIN A1C
Hgb A1c MFr Bld: 6.3 % of total Hgb — ABNORMAL HIGH (ref <5.7)
Mean Plasma Glucose: 134 mg/dL
eAG (mmol/L): 7.4 mmol/L

## 2020-07-06 LAB — LIPID PANEL
Cholesterol: 164 mg/dL (ref <200)
HDL: 45 mg/dL (ref >=40)
LDL Cholesterol (Calc): 102 mg/dL (calc) — ABNORMAL HIGH
Non-HDL Cholesterol (Calc): 119 mg/dL (calc) (ref <130)
Total CHOL/HDL Ratio: 3.6 (calc) (ref <5.0)
Triglycerides: 79 mg/dL (ref <150)

## 2020-07-06 LAB — PSA: PSA: 0.54 ng/mL (ref <=4.0)

## 2020-07-18 ENCOUNTER — Other Ambulatory Visit: Payer: Self-pay | Admitting: Internal Medicine

## 2020-11-24 ENCOUNTER — Other Ambulatory Visit: Payer: Self-pay | Admitting: Internal Medicine

## 2020-12-10 ENCOUNTER — Other Ambulatory Visit: Payer: Self-pay | Admitting: Internal Medicine

## 2020-12-22 ENCOUNTER — Other Ambulatory Visit: Payer: Self-pay | Admitting: Internal Medicine

## 2021-01-03 ENCOUNTER — Encounter: Payer: Self-pay | Admitting: Internal Medicine

## 2021-01-03 ENCOUNTER — Other Ambulatory Visit: Payer: Self-pay

## 2021-01-03 ENCOUNTER — Ambulatory Visit: Payer: BC Managed Care – PPO | Admitting: Internal Medicine

## 2021-01-03 VITALS — BP 110/80 | HR 60 | Ht 73.0 in | Wt 276.0 lb

## 2021-01-03 DIAGNOSIS — E119 Type 2 diabetes mellitus without complications: Secondary | ICD-10-CM | POA: Diagnosis not present

## 2021-01-03 MED ORDER — MELOXICAM 15 MG PO TABS: 15.0000 mg | ORAL_TABLET | Freq: Every day | ORAL | 0 refills | Status: DC

## 2021-01-03 NOTE — Progress Notes (Signed)
   Subjective:    Patient ID: Chad Gardner, male    DOB: 07/27/1965, 55 y.o.   MRN: [id number]  HPI 55 year old Male seen for follow-up on diabetes mellitus.   In September 2021 hemoglobin A1c was 8.3%.  In January 2022 he had improved to 6.3% and now hemoglobin A1c remains excellent at 6.3% and stable.  Basic metabolic panel is normal and nonfasting glucose is 69.  BUN and creatinine are normal.    Continues to have some issues with his back.  History of lumbar microdiscectomy L2-L3 4 lumbar disc herniation with radiculopathy in 2017 by Dr. Venetia Maxon.  He has essential hypertension which is well controlled.  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  This seems to aggravate his back.  Some days are better than others.    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  Separated from wife.  Has a son who lives in [place] who works as an Airline pilot.  He had diabetic eye exam in September 2021 at [facility].  He has had issues with left shoulder sometimes.  History of right shoulder arthroscopy and biceps tendinitis -had surgery in April 2021.  MRI showed moderate to moderately severe osteoarthritis with type II acromion.  Marked thickening and heterogeneously increased T2 signal within the supraspinatus and infraspinatus tendons consistent with severe tendinopathy.  History of overactive bladder and has been seen at [facility].  He is treated with Flomax.Marland Kitchen  History of renal cysts followed by Urology.  History of right trigger thumb seen by Dr. Merlyn Lot February 2020.  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  History of chronic stenosing tenosynovitis repaired by Dr. Margaree Mackintosh in 2011.  Review of Systems see above     Objective:   Physical Exam Blood pressure 110/80 pulse 60 pulse oximetry 99% weight 276 pounds BMI 36.41  Skin: Warm and dry.  Neck is supple without JVD thyromegaly or carotid  bruits.  Chest is clear to auscultation.  Cardiac exam: Regular rate and rhythm.  No lower extremity pitting edema.  Hemoglobin A1c excellent at 6.3%.  Basic metabolic panel is normal.  Fasting glucose is 69.  BUN and creatinine are normal.       Assessment & Plan:  Persistent right shoulder pain status post surgery by Dr. Madelon Lips.  Takes meloxicam.  Essential hypertension-stable on losartan and metoprolol as well as HCTZ.  Potassium is normal.  BUN and creatinine are normal.  Impaired glucose tolerance treated with glipizide XL 10 mg daily and Januvia 100 mg daily.  Hemoglobin A1c excellent at 6.3%  Urinary frequency treated with Flomax and followed by Urology  Work is stressful.  This was discussed today.  He is not able to retire at the present time.  Sometimes the work is difficult and his shoulder hurts.  Plan: Continue current medications and follow-up in 6 months.

## 2021-01-04 LAB — BASIC METABOLIC PANEL
BUN: 20 mg/dL (ref 7–25)
CO2: 24 mmol/L (ref 20–32)
Calcium: 9.4 mg/dL (ref 8.6–10.3)
Chloride: 106 mmol/L (ref 98–110)
Creat: 0.98 mg/dL (ref 0.70–1.30)
Glucose, Bld: 69 mg/dL (ref 65–99)
Potassium: 4.2 mmol/L (ref 3.5–5.3)
Sodium: 141 mmol/L (ref 135–146)

## 2021-01-04 LAB — HEMOGLOBIN A1C
Hgb A1c MFr Bld: 6.3 % of total Hgb — ABNORMAL HIGH (ref <5.7)
Mean Plasma Glucose: 134 mg/dL
eAG (mmol/L): 7.4 mmol/L

## 2021-01-30 ENCOUNTER — Other Ambulatory Visit: Payer: Self-pay | Admitting: Internal Medicine

## 2021-02-07 NOTE — Patient Instructions (Signed)
It was a pleasure to see you today.  Continue current medications and follow-up here for annual exam in 6 months.

## 2021-02-21 ENCOUNTER — Telehealth: Payer: Self-pay | Admitting: Internal Medicine

## 2021-02-21 NOTE — Telephone Encounter (Signed)
Chad Gardner [phone]  Morgan called to say he is having R Leg Pain that is running from ankle up to back or from back to ankle he is not sure which but it is becoming unbearable. What does he need to do?

## 2021-02-21 NOTE — Telephone Encounter (Signed)
scheduled

## 2021-02-22 ENCOUNTER — Other Ambulatory Visit: Payer: Self-pay

## 2021-02-22 ENCOUNTER — Ambulatory Visit: Payer: BC Managed Care – PPO | Admitting: Internal Medicine

## 2021-02-22 ENCOUNTER — Encounter: Payer: Self-pay | Admitting: Internal Medicine

## 2021-02-22 VITALS — BP 130/60 | HR 56 | Resp 18 | Ht 73.0 in | Wt 282.0 lb

## 2021-02-22 DIAGNOSIS — M5416 Radiculopathy, lumbar region: Secondary | ICD-10-CM | POA: Diagnosis not present

## 2021-02-22 DIAGNOSIS — E119 Type 2 diabetes mellitus without complications: Secondary | ICD-10-CM

## 2021-02-22 NOTE — Progress Notes (Signed)
   Subjective:    Patient ID: Chad Gardner, male    DOB: 09-19-1965, 55 y.o.   MRN: [id number]  HPI 55 year old Male seen for pain right lateral leg with walking.  Has Type 2 diabetes under good control.  Review of Systems  when walking, he has pain and tingling right lower leg and back. Says he has had this issue 4-5 months. After 10 minures of walking, he has pain. 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   Has had LS spine for right leg pain and numbness in right foot that has been present for some 7 months.     In 2017, he was found to have lumbar spondylosis, congenitally short pedicles and degenerative disc disease causing moderate to prominent impingement L2-L3 and moderate impingement L3-L4 and L5-S1.  Mild impingement at L4-L5.  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  He had L2-L3 microdiscectomy for lumbar disc herniation with radiculopathy November 25, 2015 by Dr. Venetia Maxon.  Occasionally had to take some Zanaflex postoperatively but did well until recently.  He says that this is been bothering him for several months.     Objective:   Physical Exam   Muscle strength in both lower extremities is normal.  Deep tendon reflexes in the knees are normal.      Assessment & Plan:  Right lumbar radiculopathy-needed for 4 to 5 months  History of L2-L3 microdiscectomy for lumbar disc herniation 2017  Type 2 diabetes mellitus stable and under good control  Obesity  Medrol Dosepak  Pack 6 days 4 mg start with 6 tabs day 1 and decrease by 1 tab daily. 6-5-4-3-2-1   Oxycodone 5/325 to take sparingly for pain.  Referral back to Dr. Venetia Maxon for evaluation.

## 2021-02-24 ENCOUNTER — Telehealth: Payer: Self-pay | Admitting: Internal Medicine

## 2021-02-24 MED ORDER — METHYLPREDNISOLONE 4 MG PO TABS: ORAL_TABLET | ORAL | 0 refills | Status: DC

## 2021-02-24 MED ORDER — OXYCODONE-ACETAMINOPHEN 5-325 MG PO TABS: 1.0000 | ORAL_TABLET | Freq: Three times a day (TID) | ORAL | 0 refills | Status: AC | PRN

## 2021-02-24 NOTE — Telephone Encounter (Signed)
Chad Gardner [phone]  Cem called to say his pharmacy has not received the medications you guys talked about on Tuesday.  [id number] - [place] ([place]), [place] - [address] Phone:  [phone]  Fax:  [phone]

## 2021-02-24 NOTE — Telephone Encounter (Signed)
Sending in Medrol 4 mg dose pack to take 6-5-4-3-2-1 taper. Sending in Oxycocone 5 mg to take SPARINGLY #15 for radiculopathy. Needs referral back to Dr. Venetia Maxon

## 2021-02-28 LAB — HM DIABETES EYE EXAM

## 2021-03-03 ENCOUNTER — Encounter: Payer: Self-pay | Admitting: Internal Medicine

## 2021-03-10 ENCOUNTER — Other Ambulatory Visit: Payer: Self-pay | Admitting: Internal Medicine

## 2021-03-21 ENCOUNTER — Other Ambulatory Visit: Payer: Self-pay | Admitting: Internal Medicine

## 2021-03-21 ENCOUNTER — Telehealth: Payer: Self-pay | Admitting: Internal Medicine

## 2021-03-21 NOTE — Telephone Encounter (Signed)
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 He does not feel his shoulder and back is strong enough to remove snow with a shovel, he has had surgery to repair both, and they are neither one back to normal strength.

## 2021-03-24 DIAGNOSIS — Z0289 Encounter for other administrative examinations: Secondary | ICD-10-CM

## 2021-03-25 NOTE — Telephone Encounter (Signed)
Form is completed and patient has come and picked it up

## 2021-03-30 ENCOUNTER — Other Ambulatory Visit: Payer: Self-pay | Admitting: Neurosurgery

## 2021-03-30 DIAGNOSIS — M5416 Radiculopathy, lumbar region: Secondary | ICD-10-CM

## 2021-04-21 ENCOUNTER — Ambulatory Visit
Admission: RE | Admit: 2021-04-21 | Discharge: 2021-04-21 | Disposition: A | Discharge status: Home / Self Care | Payer: BC Managed Care – PPO | Source: Ambulatory Visit | Attending: Neurosurgery | Admitting: Neurosurgery

## 2021-04-21 DIAGNOSIS — M5416 Radiculopathy, lumbar region: Secondary | ICD-10-CM

## 2021-04-21 MED ORDER — GADOBENATE DIMEGLUMINE 529 MG/ML IV SOLN
20.0000 mL | Freq: Once | INTRAVENOUS | Status: AC | PRN
  Administered 2021-04-21: 20 mL via INTRAVENOUS

## 2021-05-02 ENCOUNTER — Telehealth (INDEPENDENT_AMBULATORY_CARE_PROVIDER_SITE_OTHER): Payer: BC Managed Care – PPO | Admitting: Internal Medicine

## 2021-05-02 ENCOUNTER — Encounter: Payer: Self-pay | Admitting: Internal Medicine

## 2021-05-02 ENCOUNTER — Other Ambulatory Visit: Payer: Self-pay

## 2021-05-02 ENCOUNTER — Telehealth: Payer: Self-pay

## 2021-05-02 VITALS — BP 117/80 | Temp 97.7°F

## 2021-05-02 DIAGNOSIS — N401 Enlarged prostate with lower urinary tract symptoms: Secondary | ICD-10-CM | POA: Diagnosis not present

## 2021-05-02 DIAGNOSIS — E119 Type 2 diabetes mellitus without complications: Secondary | ICD-10-CM | POA: Diagnosis not present

## 2021-05-02 DIAGNOSIS — R351 Nocturia: Secondary | ICD-10-CM

## 2021-05-02 DIAGNOSIS — I10 Essential (primary) hypertension: Secondary | ICD-10-CM

## 2021-05-02 DIAGNOSIS — U071 COVID-19: Secondary | ICD-10-CM

## 2021-05-02 DIAGNOSIS — F411 Generalized anxiety disorder: Secondary | ICD-10-CM

## 2021-05-02 MED ORDER — NIRMATRELVIR/RITONAVIR (PAXLOVID)TABLET: 3.0000 | ORAL_TABLET | Freq: Two times a day (BID) | ORAL | 0 refills | Status: AC

## 2021-05-02 MED ORDER — HYDROCODONE BIT-HOMATROP MBR 5-1.5 MG/5ML PO SOLN: 5.0000 mL | Freq: Four times a day (QID) | ORAL | 0 refills | Status: DC | PRN

## 2021-05-02 NOTE — Telephone Encounter (Signed)
Patient was positive for Covid yesterday. He is asking for paxlovid. He states that he has congestion, cough, body aches and fatigue. He has had 3 shots. He was advised an appt would be needed for medication and we would send a message to get him scheduled.

## 2021-05-02 NOTE — Telephone Encounter (Signed)
Scheduled

## 2021-05-03 NOTE — Patient Instructions (Addendum)
Rest and drink fluids.  Walk some to prevent atelectasis.  Take Paxlovid as directed. Call if symptoms worsen.  Stay well-hydrated.  Quarantine at least 5 days.  Take Hycodan 1 teaspoon every 6 hours as needed for cough

## 2021-05-03 NOTE — Progress Notes (Signed)
   Subjective:    Patient ID: Chad Gardner, male    DOB: 24-Jun-1965, 55 y.o.   MRN: [id number]  HPI 55 year old Male seen today via interactive audio and video telecommunications due to Coronavirus pandemic.  Patient is agreeable to visit in this format today.  He is identified using 2 identifiers as Chad Gardner, a patient in this practice.  He is at his home and I am at my office.  The reason for the visit today as patient has tested positive for COVID-19 at home.  Our records indicate he has had at least 2 Covid-19 vaccines but I do not see a recent booster.  He says he has had 3 COVID vaccines  He says he has congestion ,cough, malaise and fatigue.  Has myalgias.   Patient has a history of essential hypertension, type 2 diabetes mellitus, hx of lumbar microdisectomy, prostatism. His hemoglobin A1c in August was 6.3%.  His creatinine in August was normal at 0.98  Review of Systems no vomiting     Objective:   Physical Exam  T 97.7 degrees  BP 117/80    He looks fatigued on video visit. Does not appear acutely SOB     Assessment & Plan:  Acute COVID-19 virus infection  Plan: Patient wants to take Paxlovid.  Regular dose Paxlovid was prescribed for him.  He would like something for cough then we sent in prescription for Hycodan 1 teaspoon every 6 hours as needed.  Rest and drink fluids.  Walk some to prevent atelectasis.  Call if symptoms worsen or not improving.  Will need to quarantine at least 5 days.  Time spent with this visit is 15 minutes

## 2021-05-07 ENCOUNTER — Other Ambulatory Visit: Payer: Self-pay | Admitting: Internal Medicine

## 2021-05-10 ENCOUNTER — Telehealth: Payer: Self-pay | Admitting: Internal Medicine

## 2021-05-10 NOTE — Telephone Encounter (Signed)
Faxed Positive Home COVID results to [facility] [phone], phone [phone]   +COVID 05/01/2021 

## 2021-07-10 ENCOUNTER — Other Ambulatory Visit: Payer: Self-pay | Admitting: Internal Medicine

## 2021-07-11 ENCOUNTER — Other Ambulatory Visit: Payer: Self-pay

## 2021-07-11 ENCOUNTER — Other Ambulatory Visit: Payer: BC Managed Care – PPO | Admitting: Internal Medicine

## 2021-07-11 DIAGNOSIS — E119 Type 2 diabetes mellitus without complications: Secondary | ICD-10-CM

## 2021-07-11 DIAGNOSIS — N401 Enlarged prostate with lower urinary tract symptoms: Secondary | ICD-10-CM

## 2021-07-11 DIAGNOSIS — R351 Nocturia: Secondary | ICD-10-CM

## 2021-07-11 DIAGNOSIS — I10 Essential (primary) hypertension: Secondary | ICD-10-CM

## 2021-07-11 DIAGNOSIS — Z13220 Encounter for screening for lipoid disorders: Secondary | ICD-10-CM

## 2021-07-12 LAB — LIPID PANEL
Cholesterol: 144 mg/dL (ref <200)
HDL: 42 mg/dL (ref >=40)
LDL Cholesterol (Calc): 85 mg/dL (calc)
Non-HDL Cholesterol (Calc): 102 mg/dL (calc) (ref <130)
Total CHOL/HDL Ratio: 3.4 (calc) (ref <5.0)
Triglycerides: 82 mg/dL (ref <150)

## 2021-07-12 LAB — COMPLETE METABOLIC PANEL WITH GFR
AG Ratio: 1.5 (calc) (ref 1.0–2.5)
ALT: 16 U/L (ref 9–46)
AST: 15 U/L (ref 10–35)
Albumin: 4.2 g/dL (ref 3.6–5.1)
Alkaline phosphatase (APISO): 71 U/L (ref 35–144)
BUN: 20 mg/dL (ref 7–25)
CO2: 31 mmol/L (ref 20–32)
Calcium: 9 mg/dL (ref 8.6–10.3)
Chloride: 103 mmol/L (ref 98–110)
Creat: 0.94 mg/dL (ref 0.70–1.30)
Globulin: 2.8 g/dL (calc) (ref 1.9–3.7)
Glucose, Bld: 104 mg/dL — ABNORMAL HIGH (ref 65–99)
Potassium: 4.3 mmol/L (ref 3.5–5.3)
Sodium: 140 mmol/L (ref 135–146)
Total Bilirubin: 0.5 mg/dL (ref 0.2–1.2)
Total Protein: 7 g/dL (ref 6.1–8.1)
eGFR: 95 mL/min/{1.73_m2} (ref >=60)

## 2021-07-12 LAB — CBC WITH DIFFERENTIAL/PLATELET
Absolute Monocytes: 467 cells/uL (ref 200–950)
Basophils Absolute: 8 cells/uL (ref 0–200)
Basophils Relative: 0.2 %
Eosinophils Absolute: 103 cells/uL (ref 15–500)
Eosinophils Relative: 2.5 %
HCT: 47.7 % (ref 38.5–50.0)
Hemoglobin: 16 g/dL (ref 13.2–17.1)
Lymphs Abs: 1784 cells/uL (ref 850–3900)
MCH: 28.7 pg (ref 27.0–33.0)
MCHC: 33.5 g/dL (ref 32.0–36.0)
MCV: 85.5 fL (ref 80.0–100.0)
MPV: 9.6 fL (ref 7.5–12.5)
Monocytes Relative: 11.4 %
Neutro Abs: 1738 cells/uL (ref 1500–7800)
Neutrophils Relative %: 42.4 %
Platelets: 200 10*3/uL (ref 140–400)
RBC: 5.58 10*6/uL (ref 4.20–5.80)
RDW: 13.1 % (ref 11.0–15.0)
Total Lymphocyte: 43.5 %
WBC: 4.1 10*3/uL (ref 3.8–10.8)

## 2021-07-12 LAB — MICROALBUMIN, URINE: Microalb, Ur: 0.2 mg/dL

## 2021-07-12 LAB — HEMOGLOBIN A1C
Hgb A1c MFr Bld: 6.7 % of total Hgb — ABNORMAL HIGH (ref <5.7)
Mean Plasma Glucose: 146 mg/dL
eAG (mmol/L): 8.1 mmol/L

## 2021-07-12 LAB — PSA: PSA: 0.36 ng/mL (ref <=4.00)

## 2021-07-15 ENCOUNTER — Ambulatory Visit (INDEPENDENT_AMBULATORY_CARE_PROVIDER_SITE_OTHER): Payer: BC Managed Care – PPO | Admitting: Internal Medicine

## 2021-07-15 ENCOUNTER — Encounter: Payer: Self-pay | Admitting: Internal Medicine

## 2021-07-15 ENCOUNTER — Other Ambulatory Visit: Payer: Self-pay

## 2021-07-15 VITALS — BP 118/80 | HR 73 | Temp 97.0°F | Ht 72.0 in | Wt 285.8 lb

## 2021-07-15 DIAGNOSIS — Z0000 Encounter for general adult medical examination without abnormal findings: Secondary | ICD-10-CM | POA: Diagnosis not present

## 2021-07-15 DIAGNOSIS — F411 Generalized anxiety disorder: Secondary | ICD-10-CM | POA: Diagnosis not present

## 2021-07-15 DIAGNOSIS — R351 Nocturia: Secondary | ICD-10-CM

## 2021-07-15 DIAGNOSIS — N401 Enlarged prostate with lower urinary tract symptoms: Secondary | ICD-10-CM | POA: Diagnosis not present

## 2021-07-15 DIAGNOSIS — G8929 Other chronic pain: Secondary | ICD-10-CM

## 2021-07-15 DIAGNOSIS — I10 Essential (primary) hypertension: Secondary | ICD-10-CM

## 2021-07-15 DIAGNOSIS — E119 Type 2 diabetes mellitus without complications: Secondary | ICD-10-CM

## 2021-07-15 DIAGNOSIS — M19012 Primary osteoarthritis, left shoulder: Secondary | ICD-10-CM

## 2021-07-15 DIAGNOSIS — M25512 Pain in left shoulder: Secondary | ICD-10-CM

## 2021-07-15 DIAGNOSIS — M25511 Pain in right shoulder: Secondary | ICD-10-CM

## 2021-07-15 DIAGNOSIS — M5416 Radiculopathy, lumbar region: Secondary | ICD-10-CM | POA: Diagnosis not present

## 2021-07-15 LAB — POCT URINALYSIS DIPSTICK
Bilirubin, UA: NEGATIVE
Blood, UA: NEGATIVE
Glucose, UA: NEGATIVE
Ketones, UA: NEGATIVE
Leukocytes, UA: NEGATIVE
Nitrite, UA: NEGATIVE
Protein, UA: NEGATIVE
Spec Grav, UA: 1.02 (ref 1.010–1.025)
Urobilinogen, UA: 0.2 E.U./dL
pH, UA: 6.5 (ref 5.0–8.0)

## 2021-07-15 NOTE — Progress Notes (Addendum)
Subjective:    Patient ID: Chad Gardner, male    DOB: [birth year], 56 y.o.   MRN: [id number]  HPI 56 year old Male seen for health maintenance exam and evaluation of medical issues. Has been seeing Benita Gutter.NP and had an epidural steroid for right sided sciatica and lumbar radiculopathy. Will be seeing Dr. Jake Samples in the near future.  Small quantity of hydrocodone APAP prescribed for 5 days.  He is to take this sparingly as needed.  Has appointment with Dr. Lorrine Kin on February 20  Situation discussed at length.  He understands he may need to have some back surgery in the near future.  He is also concerned about his ability to do his job with his back pain and what they are requiring him to do from a physical standpoint.  This was discussed in length today.  Currently he does not need any specific accommodations for his back.  He needs to continue working.  Does not plan to retire early and is not really financially able to retire at the present time.  He works at [employer]  in [employer] and has physical activities including ladder climbing, Scientist, research (physical sciences) , and sometimes snow removal in the winter for which we have asked for accommodations for him in the past, and other maintenance duties.  Tries not to do much heavy lifting.  He has Robaxin as a muscle relaxant if needed.  Apparently has spondylolisthesis in the lumbar region.  In October 2022 we provided an employers accommodation for him asking for no heavy lifting greater than 10 pounds due to lumbar radiculopathy and right shoulder arthropathy.  This form is under media dated 04/04/2021 in epic.  Specifically stated he could not do heavy work such as snow removal.  He had microdiscectomy L2-L3 for lumbar disc herniation with radiculopathy in 2017 by Dr. Venetia Maxon.  History of small right inguinal hernia and small renal cysts bilaterally.  He has history of left shoulder arthroscopy and biceps tenodesis April 2021 by Dr.  Madelon Lips,  History of type 2 diabetes mellitus. His hemoglobin A1c is elevated at 6.7% and in August was 6.3%.  Previously was just taking Januvia 100 mg daily.  We are adding glipizide XL 10 mg daily today.  He will return here in August.  Ophthalmologist is Dr. Dione Booze.  Reminded about annual diabetic eye exam.  Social history: He is separated from his wife.  Does not smoke.  Drinks beer sometimes when watching sports.  Recently has not been drinking at all to speak.  He is right-handed.  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  Family history: Father with history of prostate cancer, living.  Mother with history of stroke and hypertension.  They reside here in [place] and he tries to take care of them.  He has 2 brothers.  1 brother doing well and the other brother has schizophrenia and hypertension.  1 sister overweight.  History of chronic stenosing tenosynovitis repair by Dr. Teressa Senter in 2011.  He had 3 benign polyps removed in 2016 on colonoscopy by Dr. Loreta Ave.  He is due for repeat colonoscopy in 2026.  He has a history of urinary frequency and is seeing urologist.  History of erectile dysfunction but is not sexually active.  Tried tamsulosin in 2017 but could not tolerate it.  Not dribbling and not much nocturia.  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  He has a history of hypertension treated with metoprolol and HCTZ as well as losartan 100 mg daily.  Blood pressure is excellent at 118/80  His BMI is elevated at 38.75.  In August his BMI was 36.41 at that time he weighed 276 pounds.  Now weighs 285 pounds.  He must get some of this weight off and needs to walk some.  He had mild case of acute COVID-19 in November 2022 and recovered.  He had fasting labs done on February 6.  Lipid panel is normal.  CBC is normal.  Fasting glucose is 104.  BUN and creatinine are normal.  Liver functions are  normal.  Review of Systems  Genitourinary:        Has some nocturia unable to tolerate tamsulosin  Musculoskeletal:        Says he has bilateral shoulder pain  Neurological:        Chronic right lumbar radiculopathy  see above     Objective:   Physical Exam Vitals reviewed.  Constitutional:      General: He is not in acute distress.    Appearance: Normal appearance.  HENT:     Head: Normocephalic.     Right Ear: Tympanic membrane normal.     Left Ear: Tympanic membrane normal.     Nose: Nose normal.     Mouth/Throat:     Pharynx: Oropharynx is clear.  Eyes:     General: No scleral icterus.       Right eye: No discharge.        Left eye: No discharge.     Extraocular Movements: Extraocular movements intact.     Pupils: Pupils are equal, round, and reactive to light.  Neck:     Vascular: No carotid bruit.  Cardiovascular:     Rate and Rhythm: Normal rate and regular rhythm.     Pulses: Normal pulses.     Heart sounds: Normal heart sounds. No murmur heard. Pulmonary:     Effort: Pulmonary effort is normal.     Breath sounds: Normal breath sounds. No wheezing.  Abdominal:     General: Bowel sounds are normal.     Palpations: Abdomen is soft. There is no mass.     Tenderness: There is no abdominal tenderness. There is no guarding or rebound.  Genitourinary:    Prostate: Normal.  Musculoskeletal:     Cervical back: Neck supple.     Right lower leg: No edema.     Left lower leg: No edema.  Lymphadenopathy:     Cervical: No cervical adenopathy.  Skin:    General: Skin is warm and dry.  Neurological:     Mental Status: He is alert and oriented to person, place, and time.     Cranial Nerves: No cranial nerve deficit.     Motor: No weakness.     Comments: He is anxious about his back situation as it relates to his employment.  Psychiatric:        Mood and Affect: Mood normal.        Behavior: Behavior normal.        Thought Content: Thought content normal.         Judgment: Judgment normal.   Blood pressure 118/80 pulse 73 temperature 97 degrees with ear thermometer pulse oximetry 97% BMI 38.75 weight 285 pounds 12 ounces       Assessment & Plan:  Right lumbar radiculopathy-to be seen by Dr. Lorrine Kin in the near future.  Patient has been told that he might need lumbar surgery for right lumbar radiculopathy.  Reviewed MRI with him today.  This was ordered by Benita Gutter nurse practitioner at [facility] and was performed April 21, 2021.  He has grade 1 anterior listhesis L5-S1 which appears mildly progressed.  He has pars defect bilaterally at L5-S1 with moderate to severe bilateral foraminal stenosis.  At L3-L4 has right extraforaminal disc protrusion which likely contacts the right L3 nerve.  Has similar mild bilateral foraminal stenosis and mild canal stenosis.  L4-L5 mild bilateral foraminal stenosis at L2-L3 interval postoperative changes improved canal stenosis which is now mild and left subarticular recess stenosis which is now mild to moderate.  Some mild bilateral foraminal stenosis.  Conservative treatment is to be tried first and he will likely get an epidural steroid injection.  Type 2 diabetes mellitus stable on current regimen  History of left shoulder arthropathy  History of lumbar discectomy L2-L3 by Dr. Venetia Maxon in the remote past  History of BPH  Plan: He has been provided with small quantity of Norco 10/325 to take sparingly for back pain.  He will be seeing Dr. Lorrine Kin in the near future and I suspect he will receive lumbar epidural steroid treatment.  His colonoscopy is up-to-date.  His tetanus immunization is up-to-date.  Follow-up in 6 months.  Essential hypertension stable

## 2021-07-15 NOTE — Patient Instructions (Addendum)
Having bilateral shoulder pain in addition to back pain.  Given small quantity of Norco 10/325 to take sparingly for pain if needed.  Continue current medications and follow-up in 6 months.  Continue follow-up with [facility].  Return here in 6 months.

## 2021-07-18 ENCOUNTER — Telehealth: Payer: Self-pay

## 2021-07-18 MED ORDER — HYDROCODONE-ACETAMINOPHEN 10-325 MG PO TABS
1.0000 | ORAL_TABLET | Freq: Three times a day (TID) | ORAL | 0 refills | Status: AC | PRN
Start: 2021-07-18 — End: 2021-07-23

## 2021-07-18 NOTE — Telephone Encounter (Incomplete Revision)
Patient states he was to have a pain medication sent in but it did not make it to the pharmacy.

## 2021-07-18 NOTE — Telephone Encounter (Addendum)
Patient states he was to have a pain medication sent in but it did not make it to the pharmacy.  ° ° ° °

## 2021-07-31 MED ORDER — HYDROCODONE-ACETAMINOPHEN 10-325 MG PO TABS: 1.0000 | ORAL_TABLET | Freq: Three times a day (TID) | ORAL | 0 refills | Status: AC | PRN

## 2021-08-15 ENCOUNTER — Other Ambulatory Visit: Payer: Self-pay | Admitting: Internal Medicine

## 2021-09-07 ENCOUNTER — Other Ambulatory Visit: Payer: Self-pay | Admitting: Neurological Surgery

## 2021-09-14 ENCOUNTER — Other Ambulatory Visit: Payer: Self-pay | Admitting: Neurological Surgery

## 2021-09-23 NOTE — Progress Notes (Signed)
Surgical Instructions ? ? ? Your procedure is scheduled on 10/04/21. ? Report to [facility] Main Entrance "A" at 8:05 A.M., then check in with the Admitting office. ? Call this number if you have problems the morning of surgery: ? [phone] ? ? If you have any questions prior to your surgery date call [phone]: Open Monday-Friday 8am-4pm ? ? ? Remember: ? Do not eat or drink after midnight the night before your surgery ? ?  ? Take these medicines the morning of surgery with A SIP OF WATER:  ?metoprolol succinate (TOPROL-XL)  ?[facility]) ? ?As of today, STOP taking any Aspirin (unless otherwise instructed by your surgeon) Aleve, Naproxen, Ibuprofen, Motrin, Advil, Goody's, BC's, all herbal medications, fish oil, celecoxib (CELEBREX) and all vitamins. ? ?WHAT DO I DO ABOUT MY DIABETES MEDICATION? ? ? ?Do not take oral diabetes medicines (pills) the morning of surgery. ? ?THE MORNING OF SURGERY, do not take glipiZIDE (GLUCOTROL XL) or JANUVIA. ? ?The day of surgery, do not take other diabetes injectables, including Byetta (exenatide), Bydureon (exenatide ER), Victoza (liraglutide), or Trulicity (dulaglutide). ? ?If your CBG is greater than 220 mg/dL, you may take ? of your sliding scale (correction) dose of insulin. ? ? ?HOW TO MANAGE YOUR DIABETES ?BEFORE AND AFTER SURGERY ? ?Why is it important to control my blood sugar before and after surgery? ?Improving blood sugar levels before and after surgery helps healing and can limit problems. ?A way of improving blood sugar control is eating a healthy diet by: ? Eating less sugar and carbohydrates ? Increasing activity/exercise ? Talking with your doctor about reaching your blood sugar goals ?High blood sugars (greater than 180 mg/dL) can raise your risk of infections and slow your recovery, so you will need to focus on controlling your diabetes during the weeks before surgery. ?Make sure that the doctor who takes care of your diabetes knows about your planned  surgery including the date and location. ? ?How do I manage my blood sugar before surgery? ?Check your blood sugar at least 4 times a day, starting 2 days before surgery, to make sure that the level is not too high or low. ? ?Check your blood sugar the morning of your surgery when you wake up and every 2 hours until you get to the Short Stay unit. ? ?If your blood sugar is less than 70 mg/dL, you will need to treat for low blood sugar: ?Do not take insulin. ?Treat a low blood sugar (less than 70 mg/dL) with ? cup of clear juice (cranberry or apple), 4 glucose tablets, OR glucose gel. ?Recheck blood sugar in 15 minutes after treatment (to make sure it is greater than 70 mg/dL). If your blood sugar is not greater than 70 mg/dL on recheck, call [phone] for further instructions. ?Report your blood sugar to the short stay nurse when you get to Short Stay. ? ?If you are admitted to the hospital after surgery: ?Your blood sugar will be checked by the staff and you will probably be given insulin after surgery (instead of oral diabetes medicines) to make sure you have good blood sugar levels. ?The goal for blood sugar control after surgery is 80-180 mg/dL. ?         ?Do not wear jewelry or makeup ?Do not wear lotions, powders, perfumes/colognes, or deodorant. ?Men may shave face and neck. ?Do not bring valuables to the hospital. ?Do not wear nail polish, gel polish, artificial nails, or any other type of covering on natural  nails (fingers and toes) ?If you have artificial nails or gel coating that need to be removed by a nail salon, please have this removed prior to surgery. Artificial nails or gel coating may interfere with anesthesia's ability to adequately monitor your vital signs. ? ?[facility] is not responsible for any belongings or valuables. .  ? ?Do NOT Smoke (Tobacco/Vaping)  24 hours prior to your procedure ? ?If you use a CPAP at night, you may bring your mask for your overnight stay. ?  ?Contacts, glasses,  hearing aids, dentures or partials may not be worn into surgery, please bring cases for these belongings ?  ?For patients admitted to the hospital, discharge time will be determined by your treatment team. ?  ?Patients discharged the day of surgery will not be allowed to drive home, and someone needs to stay with them for 24 hours. ? ? ?SURGICAL WAITING ROOM VISITATION ?Patients having surgery or a procedure in a hospital may have two support people. ?Children under the age of 58 must have an adult with them who is not the patient. ?They may stay in the waiting area during the procedure and may switch out with other visitors. If the patient needs to stay at the hospital during part of their recovery, the visitor guidelines for inpatient rooms apply. ? ?Please refer to the [facility] website for the visitor guidelines for Inpatients (after your surgery is over and you are in a regular room).  ? ? ? ? ? ?Special instructions:   ? ?Oral Hygiene is also important to reduce your risk of infection.  Remember - BRUSH YOUR TEETH THE MORNING OF SURGERY WITH YOUR REGULAR TOOTHPASTE ? ? ?[facility]- Preparing For Surgery ? ?Before surgery, you can play an important role. Because skin is not sterile, your skin needs to be as free of germs as possible. You can reduce the number of germs on your skin by washing with CHG (chlorahexidine gluconate) Soap before surgery.  CHG is an antiseptic cleaner which kills germs and bonds with the skin to continue killing germs even after washing.   ? ? ?Please do not use if you have an allergy to CHG or antibacterial soaps. If your skin becomes reddened/irritated stop using the CHG.  ?Do not shave (including legs and underarms) for at least 48 hours prior to first CHG shower. It is OK to shave your face. ? ?Please follow these instructions carefully. ?  ? ? Shower the NIGHT BEFORE SURGERY and the MORNING OF SURGERY with CHG Soap.  ? If you chose to wash your hair, wash your hair first as  usual with your normal shampoo. After you shampoo, rinse your hair and body thoroughly to remove the shampoo.  Then Nucor CorporationWash Face and genitals (private parts) with your normal soap and rinse thoroughly to remove soap. ? ?After that Use CHG Soap as you would any other liquid soap. You can apply CHG directly to the skin and wash gently with a scrungie or a clean washcloth.  ? ?Apply the CHG Soap to your body ONLY FROM THE NECK DOWN.  Do not use on open wounds or open sores. Avoid contact with your eyes, ears, mouth and genitals (private parts). Wash Face and genitals (private parts)  with your normal soap.  ? ?Wash thoroughly, paying special attention to the area where your surgery will be performed. ? ?Thoroughly rinse your body with warm water from the neck down. ? ?DO NOT shower/wash with your normal soap after using and  rinsing off the CHG Soap. ? ?Pat yourself dry with a CLEAN TOWEL. ? ?Wear CLEAN PAJAMAS to bed the night before surgery ? ?Place CLEAN SHEETS on your bed the night before your surgery ? ?DO NOT SLEEP WITH PETS. ? ? ?Day of Surgery: ?Take a shower with CHG soap. ?Wear Clean/Comfortable clothing the morning of surgery ?Do not apply any deodorants/lotions.   ?Remember to brush your teeth WITH YOUR REGULAR TOOTHPASTE. ? ? ? ?If you received a COVID test during your pre-op visit, it is requested that you wear a mask when out in public, stay away from anyone that may not be feeling well, and notify your surgeon if you develop symptoms. If you have been in contact with anyone that has tested positive in the last 10 days, please notify your surgeon. ? ?  ?Please read over the following fact sheets that you were given.  ? ?

## 2021-09-26 ENCOUNTER — Encounter (HOSPITAL_COMMUNITY): Payer: Self-pay

## 2021-09-26 ENCOUNTER — Encounter (HOSPITAL_COMMUNITY)
Admission: RE | Admit: 2021-09-26 | Discharge: 2021-09-26 | Disposition: A | Discharge status: Home / Self Care | Payer: BC Managed Care – PPO | Source: Ambulatory Visit | Attending: Neurological Surgery | Admitting: Neurological Surgery

## 2021-09-26 ENCOUNTER — Other Ambulatory Visit: Payer: Self-pay

## 2021-09-26 VITALS — BP 141/91 | HR 61 | Temp 98.1°F | Resp 17 | Ht 74.0 in | Wt 277.2 lb

## 2021-09-26 DIAGNOSIS — M4316 Spondylolisthesis, lumbar region: Secondary | ICD-10-CM | POA: Insufficient documentation

## 2021-09-26 DIAGNOSIS — M48061 Spinal stenosis, lumbar region without neurogenic claudication: Secondary | ICD-10-CM | POA: Insufficient documentation

## 2021-09-26 DIAGNOSIS — M1990 Unspecified osteoarthritis, unspecified site: Secondary | ICD-10-CM | POA: Diagnosis not present

## 2021-09-26 DIAGNOSIS — Z6835 Body mass index (BMI) 35.0-35.9, adult: Secondary | ICD-10-CM | POA: Diagnosis not present

## 2021-09-26 DIAGNOSIS — E119 Type 2 diabetes mellitus without complications: Secondary | ICD-10-CM

## 2021-09-26 DIAGNOSIS — N3281 Overactive bladder: Secondary | ICD-10-CM | POA: Diagnosis not present

## 2021-09-26 DIAGNOSIS — Z01818 Encounter for other preprocedural examination: Secondary | ICD-10-CM | POA: Diagnosis present

## 2021-09-26 DIAGNOSIS — E669 Obesity, unspecified: Secondary | ICD-10-CM | POA: Insufficient documentation

## 2021-09-26 DIAGNOSIS — I2510 Atherosclerotic heart disease of native coronary artery without angina pectoris: Secondary | ICD-10-CM | POA: Insufficient documentation

## 2021-09-26 DIAGNOSIS — I10 Essential (primary) hypertension: Secondary | ICD-10-CM | POA: Diagnosis not present

## 2021-09-26 HISTORY — DX: Unspecified osteoarthritis, unspecified site: M19.90

## 2021-09-26 HISTORY — DX: Type 2 diabetes mellitus without complications: E11.9

## 2021-09-26 HISTORY — DX: Overactive bladder: N32.81

## 2021-09-26 LAB — BASIC METABOLIC PANEL
Anion gap: 4 — ABNORMAL LOW (ref 5–15)
BUN: 18 mg/dL (ref 6–20)
CO2: 27 mmol/L (ref 22–32)
Calcium: 8.7 mg/dL — ABNORMAL LOW (ref 8.9–10.3)
Chloride: 107 mmol/L (ref 98–111)
Creatinine, Ser: 0.79 mg/dL (ref 0.61–1.24)
GFR, Estimated: >60 mL/min (ref >60)
Glucose, Bld: 125 mg/dL — ABNORMAL HIGH (ref 70–99)
Potassium: 4.2 mmol/L (ref 3.5–5.1)
Sodium: 138 mmol/L (ref 135–145)

## 2021-09-26 LAB — HEMOGLOBIN A1C
Hgb A1c MFr Bld: 6.8 % — ABNORMAL HIGH (ref 4.8–5.6)
Mean Plasma Glucose: 148.46 mg/dL

## 2021-09-26 LAB — CBC
HCT: 46.2 % (ref 39.0–52.0)
Hemoglobin: 15.5 g/dL (ref 13.0–17.0)
MCH: 28.9 pg (ref 26.0–34.0)
MCHC: 33.5 g/dL (ref 30.0–36.0)
MCV: 86 fL (ref 80.0–100.0)
Platelets: 203 10*3/uL (ref 150–400)
RBC: 5.37 MIL/uL (ref 4.22–5.81)
RDW: 13.8 % (ref 11.5–15.5)
WBC: 5.3 10*3/uL (ref 4.0–10.5)
nRBC: 0 % (ref 0.0–0.2)

## 2021-09-26 LAB — SURGICAL PCR SCREEN
MRSA, PCR: NEGATIVE
Staphylococcus aureus: NEGATIVE

## 2021-09-26 LAB — GLUCOSE, CAPILLARY: Glucose-Capillary: 127 mg/dL — ABNORMAL HIGH (ref 70–99)

## 2021-09-26 LAB — NO BLOOD PRODUCTS

## 2021-09-26 NOTE — Progress Notes (Signed)
PCP - Dr. Marlan Palau ?Cardiologist - denies ? ?PPM/ICD - n/a ? ?Chest x-ray - n/a ?EKG - 09/26/21 ?Stress Test - denies ?ECHO - denies ?Cardiac Cath - denies ? ?Sleep Study -denies  ?CPAP - denies ? ?Fasting Blood Sugar - 110-140 ?Checks Blood Sugar 2 times a week ?CBG at PAT 127. Last A1C 6.7 on 07/11/21. Will collect today. ? ?Blood Thinner Instructions: n/a ?Aspirin Instructions: n/a ? ?NPO at MD ? ?COVID TEST- n/a ? ? ?Anesthesia review: Yes, ekg review ? ?Patient denies shortness of breath, fever, cough and chest pain at PAT appointment ? ? ?All instructions explained to the patient, with a verbal understanding of the material. Patient agrees to go over the instructions while at home for a better understanding. Patient also instructed to self quarantine after being tested for COVID-19. The opportunity to ask questions was provided. ? ? ?

## 2021-09-27 NOTE — Anesthesia Preprocedure Evaluation (Addendum)
Anesthesia Evaluation  ?Patient identified by MRN, date of birth, ID band ?Patient awake ? ? ? ?Reviewed: ?Allergy & Precautions, NPO status , Patient's Chart, lab work & pertinent test results ? ?Airway ?Mallampati: II ? ?TM Distance: >3 FB ?Neck ROM: Full ? ? ? Dental ?no notable dental hx. ? ?  ?Pulmonary ?neg pulmonary ROS,  ?  ?Pulmonary exam normal ? ? ? ? ? ? ? Cardiovascular ?hypertension, Pt. on medications and Pt. on home beta blockers ? ?Rhythm:Regular Rate:Normal ? ? ?  ?Neuro/Psych ?negative neurological ROS ? negative psych ROS  ? GI/Hepatic ?negative GI ROS, Neg liver ROS,   ?Endo/Other  ?diabetes, Type 2, Oral Hypoglycemic AgentsMorbid obesity ? Renal/GU ?negative Renal ROS  ?negative genitourinary ?  ?Musculoskeletal ? ?(+) Arthritis , Osteoarthritis,  Spondylolisthesis   ? Abdominal ?Normal abdominal exam  (+)   ?Peds ? Hematology ?negative hematology ROS ?(+)   ?Anesthesia Other Findings ? ? Reproductive/Obstetrics ? ?  ? ? ? ? ? ? ? ? ? ? ? ? ? ?  ?  ? ? ? ? ? ? ?Anesthesia Physical ?Anesthesia Plan ? ?ASA: 3 ? ?Anesthesia Plan: General  ? ?Post-op Pain Management: Celebrex PO (pre-op)* and Tylenol PO (pre-op)*  ? ?Induction: Intravenous ? ?PONV Risk Score and Plan: 2 and Ondansetron, Dexamethasone, Midazolam and Treatment may vary due to age or medical condition ? ?Airway Management Planned: Mask and Oral ETT ? ?Additional Equipment: None ? ?Intra-op Plan:  ? ?Post-operative Plan: Extubation in OR ? ?Informed Consent: I have reviewed the patients History and Physical, chart, labs and discussed the procedure including the risks, benefits and alternatives for the proposed anesthesia with the patient or authorized representative who has indicated his/her understanding and acceptance.  ? ? ? ?Dental advisory given ? ?Plan Discussed with:  ? ?Anesthesia Plan Comments: (PAT note written 09/27/2021 by Myra Gianotti, PA-C. ? ?Lab Results ?     Component                 Value               Date                 ?     WBC                      5.3                 09/26/2021           ?     HGB                      15.5                09/26/2021           ?     HCT                      46.2                09/26/2021           ?     MCV                      86.0                09/26/2021           ?  PLT                      203                 09/26/2021           ?Lab Results ?     Component                Value               Date                 ?     NA                       138                 09/26/2021           ?     K                        4.2                 09/26/2021           ?     CO2                      27                  09/26/2021           ?     GLUCOSE                  125 (H)             09/26/2021           ?     BUN                      18                  09/26/2021           ?     CREATININE               0.79                09/26/2021           ?     CALCIUM                  8.7 (L)             09/26/2021           ?     EGFR                     95                  07/11/2021           ?     GFRNONAA                 >60                 09/26/2021          )  ? ? ? ? ? ?Anesthesia Quick Evaluation ? ?

## 2021-09-27 NOTE — Progress Notes (Signed)
Anesthesia Chart Review: ? Case: [id number] Date/Time: 10/04/21 0950  ? Procedure: MIS DECOMPRESSION AND TLIF L5-S1, RT SIDED APPROACH (Right) - 3C  ? Anesthesia type: General  ? Pre-op diagnosis: SPONDYLOLISTHESIS, LUMBAR REGION  ? Location: [facility] OR ROOM 19 / [facility] OR  ? Surgeons: Karsten Ro, DO  ? ?  ? ? ?DISCUSSION: Patient is a 56 year old male scheduled for the above procedure. ? ?History includes never smoker, HTN, DM2, arthritis, overactive bladder, spinal surgery (L2-3 microdiscectomy 2017).  BMI is consistent with obesity. ? ?Last PCP visit with Dr. Renold Genta for health maintenance exam and evaluation of chronic medical issues on 07/15/2021.  He had received an epidural steroid injection for back pain and was awaiting evaluation by Dr. Reatha Armour.  She notes that he may need back surgery.  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  He has been on restriction from heavy lifting since October 2022. ? ?A1c 6.8%.  CBC normal with hemoglobin 15.5. Patient signed refusal of blood form indicating that he requested only albumin or albumin containing products may be administered if necessary to "preserve my life or promote my recovery." ? ?Preoperative EKG with comparison tracing reviewed with anesthesiologist Nolon Nations, MD. See below. Patient denied chest pain and SOB at PAT RN visit. Anesthesia team to evaluate on the day of surgery.  ? ?  ?VS: BP (!) 141/91   Pulse 61   Temp 36.7 ?C (Oral)   Resp 17   Ht '6\' 2"'  (1.88 m)   Wt 125.7 kg   SpO2 100%   BMI 35.59 kg/m?  ? ? ?PROVIDERS: ?Baxley, Cresenciano Lick, MD is PCP  ? ? ? ?LABS: Labs reviewed: Acceptable for surgery. ?(all labs ordered are listed, but only abnormal results are displayed) ? ?Labs Reviewed  ?GLUCOSE, CAPILLARY - Abnormal; Notable for the following components:  ?    Result Value  ? Glucose-Capillary 127 (*)   ? All other components within  normal limits  ?HEMOGLOBIN A1C - Abnormal; Notable for the following components:  ? Hgb A1c MFr Bld 6.8 (*)   ? All other components within normal limits  ?BASIC METABOLIC PANEL - Abnormal; Notable for the following components:  ? Glucose, Bld 125 (*)   ? Calcium 8.7 (*)   ? Anion gap 4 (*)   ? All other components within normal limits  ?SURGICAL PCR SCREEN  ?CBC  ?NO BLOOD PRODUCTS  ? ? ? ?IMAGES: ?MRI L-spine 04/21/21: ?IMPRESSION: ?1. At L5-S1, chronic bilateral pars defects with mildly progressive ?grade 1 anterolisthesis and mildly progressive moderate to severe ?bilateral foraminal stenosis. ?2. At L2-L3, interval postoperative changes with improvied canal ?stenosis (now mild) and left subarticular recess stenosis (now mild ?to moderate). Similar mild bilateral foraminal stenosis. ?3. At L3-L4, likely decreased size of a right extraforaminal disc ?protrusion which likely contacts the exited right L3 nerve. Similar ?mild bilateral foraminal stenosis and mild canal stenosis. ?4. At L4-L5, similar mild bilateral foraminal stenosis. ? ? ?EKG: 09/26/21: ?Normal sinus rhythm ?Anteroseptal infarct , age undetermined ?Abnormal ECG ?Confirmed by Martinique, Peter [phone]) on 09/27/2021 8:13:07 AM ?- Poor r wave progression more pronounced when compared with 02/02/17 tracing. No significant ST/T changes. ? ? ?CV: N/A ? ?Past Medical History:  ?Diagnosis Date  ? Arthritis   ? Diabetes mellitus without complication ([place])   ? Hypertension   ? Overactive bladder   ? Tendonitis of wrist, left   ? ? ?  Past Surgical History:  ?Procedure Laterality Date  ? BACK SURGERY    ? ruptured disc  ? SHOULDER ARTHROSCOPY Left 09/24/2019  ? TENDON RELEASE  06/05/2009  ? Lt wrist  ? ? ?MEDICATIONS: ? ACCU-CHEK AVIVA PLUS test strip  ? Blood Glucose Monitoring Suppl (ACCU-CHEK AVIVA PLUS) W/DEVICE KIT  ? celecoxib (CELEBREX) 200 MG capsule  ? glipiZIDE (GLUCOTROL XL) 10 MG 24 hr tablet  ? hydrochlorothiazide (HYDRODIURIL) 25 MG tablet  ? JANUVIA 100  MG tablet  ? losartan (COZAAR) 100 MG tablet  ? metoprolol succinate (TOPROL-XL) 25 MG 24 hr tablet  ? Multiple Vitamin (MULTIVITAMIN WITH MINERALS) TABS tablet  ? TURMERIC PO  ? Vibegron (GEMTESA) 75 MG TABS  ? ?No current facility-administered medications for this encounter.  ? ? ?Myra Gianotti, PA-C ?Surgical Short Stay/Anesthesiology ?[facility] Phone [phone] ?[facility] Phone [phone] ?09/27/2021 6:39 PM ? ? ? ? ? ? ?

## 2021-10-03 ENCOUNTER — Telehealth: Payer: Self-pay

## 2021-10-03 NOTE — Telephone Encounter (Signed)
Patient wanted to let you know he was having back surgery tomorrow with Dr Steele Sizer.  ?

## 2021-10-04 ENCOUNTER — Ambulatory Visit (HOSPITAL_COMMUNITY): Payer: BC Managed Care – PPO | Admitting: Vascular Surgery

## 2021-10-04 ENCOUNTER — Ambulatory Visit (HOSPITAL_COMMUNITY): Payer: BC Managed Care – PPO

## 2021-10-04 ENCOUNTER — Encounter (HOSPITAL_COMMUNITY)
Admission: RE | Disposition: A | Discharge status: Home / Self Care | Payer: Self-pay | Source: Home / Self Care | Attending: Neurological Surgery

## 2021-10-04 ENCOUNTER — Encounter (HOSPITAL_COMMUNITY): Payer: Self-pay | Admitting: Neurological Surgery

## 2021-10-04 ENCOUNTER — Other Ambulatory Visit: Payer: Self-pay

## 2021-10-04 ENCOUNTER — Observation Stay (HOSPITAL_COMMUNITY)
Admission: RE | Admit: 2021-10-04 | Discharge: 2021-10-05 | Disposition: A | Discharge status: Home / Self Care | Payer: BC Managed Care – PPO | Attending: Neurological Surgery | Admitting: Neurological Surgery

## 2021-10-04 ENCOUNTER — Ambulatory Visit (HOSPITAL_COMMUNITY): Payer: BC Managed Care – PPO | Admitting: Anesthesiology

## 2021-10-04 DIAGNOSIS — M4807 Spinal stenosis, lumbosacral region: Secondary | ICD-10-CM | POA: Insufficient documentation

## 2021-10-04 DIAGNOSIS — M4317 Spondylolisthesis, lumbosacral region: Principal | ICD-10-CM | POA: Insufficient documentation

## 2021-10-04 DIAGNOSIS — Z79899 Other long term (current) drug therapy: Secondary | ICD-10-CM | POA: Diagnosis not present

## 2021-10-04 DIAGNOSIS — M5417 Radiculopathy, lumbosacral region: Secondary | ICD-10-CM | POA: Insufficient documentation

## 2021-10-04 DIAGNOSIS — Z7984 Long term (current) use of oral hypoglycemic drugs: Secondary | ICD-10-CM | POA: Diagnosis not present

## 2021-10-04 DIAGNOSIS — E119 Type 2 diabetes mellitus without complications: Secondary | ICD-10-CM | POA: Diagnosis not present

## 2021-10-04 DIAGNOSIS — M5459 Other low back pain: Secondary | ICD-10-CM | POA: Diagnosis present

## 2021-10-04 DIAGNOSIS — I10 Essential (primary) hypertension: Secondary | ICD-10-CM | POA: Insufficient documentation

## 2021-10-04 DIAGNOSIS — M4316 Spondylolisthesis, lumbar region: Secondary | ICD-10-CM | POA: Diagnosis present

## 2021-10-04 HISTORY — PX: TRANSFORAMINAL LUMBAR INTERBODY FUSION W/ MIS 1 LEVEL: SHX6145

## 2021-10-04 LAB — GLUCOSE, CAPILLARY
Glucose-Capillary: 178 mg/dL — ABNORMAL HIGH (ref 70–99)
Glucose-Capillary: 156 mg/dL — ABNORMAL HIGH (ref 70–99)
Glucose-Capillary: 162 mg/dL — ABNORMAL HIGH (ref 70–99)
Glucose-Capillary: 334 mg/dL — ABNORMAL HIGH (ref 70–99)

## 2021-10-04 SURGERY — MINIMALLY INVASIVE (MIS) TRANSFORAMINAL LUMBAR INTERBODY FUSION (TLIF) 1 LEVEL
Anesthesia: General | Laterality: Right

## 2021-10-04 MED ORDER — THROMBIN 5000 UNITS EX SOLR
OROMUCOSAL | Status: DC | PRN
  Administered 2021-10-04: 5 mL via TOPICAL

## 2021-10-04 MED ORDER — PHENYLEPHRINE HCL-NACL 20-0.9 MG/250ML-% IV SOLN
INTRAVENOUS | Status: DC | PRN
  Administered 2021-10-04: 20 ug/min via INTRAVENOUS

## 2021-10-04 MED ORDER — LIDOCAINE-EPINEPHRINE 1 %-1:100000 IJ SOLN
INTRAMUSCULAR | Status: DC | PRN
  Administered 2021-10-04: 10 mL

## 2021-10-04 MED ORDER — PROPOFOL 10 MG/ML IV BOLUS
INTRAVENOUS | Status: AC
  Filled 2021-10-04: qty 20

## 2021-10-04 MED ORDER — OXYCODONE HCL 5 MG PO TABS: 5.0000 mg | ORAL_TABLET | ORAL | Status: DC | PRN

## 2021-10-04 MED ORDER — FENTANYL CITRATE (PF) 100 MCG/2ML IJ SOLN
25.0000 ug | INTRAMUSCULAR | Status: DC | PRN
  Administered 2021-10-04 (×2): 50 ug via INTRAVENOUS

## 2021-10-04 MED ORDER — INSULIN ASPART 100 UNIT/ML IJ SOLN
0.0000 [IU] | Freq: Three times a day (TID) | INTRAMUSCULAR | Status: DC
  Administered 2021-10-05: 3 [IU] via SUBCUTANEOUS

## 2021-10-04 MED ORDER — METOPROLOL SUCCINATE ER 25 MG PO TB24: 25.0000 mg | ORAL_TABLET | Freq: Every day | ORAL | Status: DC

## 2021-10-04 MED ORDER — DOCUSATE SODIUM 100 MG PO CAPS
100.0000 mg | ORAL_CAPSULE | Freq: Two times a day (BID) | ORAL | Status: DC
  Administered 2021-10-04 – 2021-10-05 (×2): 100 mg via ORAL
  Filled 2021-10-04 (×2): qty 1

## 2021-10-04 MED ORDER — FENTANYL CITRATE (PF) 250 MCG/5ML IJ SOLN
INTRAMUSCULAR | Status: DC | PRN
  Administered 2021-10-04: 100 ug via INTRAVENOUS
  Administered 2021-10-04 (×3): 50 ug via INTRAVENOUS

## 2021-10-04 MED ORDER — HYDROMORPHONE HCL 1 MG/ML IJ SOLN
0.5000 mg | INTRAMUSCULAR | Status: DC | PRN
  Administered 2021-10-04: 0.5 mg via INTRAVENOUS
  Filled 2021-10-04: qty 0.5

## 2021-10-04 MED ORDER — ACETAMINOPHEN 10 MG/ML IV SOLN
INTRAVENOUS | Status: AC
  Filled 2021-10-04: qty 100

## 2021-10-04 MED ORDER — ONDANSETRON HCL 4 MG/2ML IJ SOLN
INTRAMUSCULAR | Status: DC | PRN
Start: 2021-10-04 — End: 2021-10-04
  Administered 2021-10-04: 4 mg via INTRAVENOUS

## 2021-10-04 MED ORDER — ACETAMINOPHEN 650 MG RE SUPP: 650.0000 mg | RECTAL | Status: DC | PRN

## 2021-10-04 MED ORDER — CHLORHEXIDINE GLUCONATE CLOTH 2 % EX PADS: 6.0000 | MEDICATED_PAD | Freq: Once | CUTANEOUS | Status: DC

## 2021-10-04 MED ORDER — DEXAMETHASONE SODIUM PHOSPHATE 10 MG/ML IJ SOLN
INTRAMUSCULAR | Status: AC
  Filled 2021-10-04: qty 1

## 2021-10-04 MED ORDER — ROCURONIUM BROMIDE 10 MG/ML (PF) SYRINGE
PREFILLED_SYRINGE | INTRAVENOUS | Status: DC | PRN
  Administered 2021-10-04 (×2): 20 mg via INTRAVENOUS
  Administered 2021-10-04: 80 mg via INTRAVENOUS
  Administered 2021-10-04: 20 mg via INTRAVENOUS

## 2021-10-04 MED ORDER — SODIUM CHLORIDE 0.9 % IV SOLN
250.0000 mL | INTRAVENOUS | Status: DC
  Administered 2021-10-04: 250 mL via INTRAVENOUS

## 2021-10-04 MED ORDER — BUPIVACAINE-EPINEPHRINE 0.5% -1:200000 IJ SOLN
INTRAMUSCULAR | Status: AC
  Filled 2021-10-04: qty 1

## 2021-10-04 MED ORDER — HYDROMORPHONE HCL 1 MG/ML IJ SOLN
INTRAMUSCULAR | Status: DC | PRN
  Administered 2021-10-04 (×2): .25 mg via INTRAVENOUS

## 2021-10-04 MED ORDER — LIDOCAINE 2% (20 MG/ML) 5 ML SYRINGE
INTRAMUSCULAR | Status: DC | PRN
  Administered 2021-10-04: 80 mg via INTRAVENOUS

## 2021-10-04 MED ORDER — HEMOSTATIC AGENTS (NO CHARGE) OPTIME
TOPICAL | Status: DC | PRN
  Administered 2021-10-04: 1 via TOPICAL

## 2021-10-04 MED ORDER — VIBEGRON 75 MG PO TABS
75.0000 mg | ORAL_TABLET | Freq: Every day | ORAL | Status: DC
Start: 2021-10-04 — End: 2021-10-05

## 2021-10-04 MED ORDER — CHLORHEXIDINE GLUCONATE 0.12 % MT SOLN
15.0000 mL | Freq: Once | OROMUCOSAL | Status: AC
  Administered 2021-10-04: 15 mL via OROMUCOSAL
  Filled 2021-10-04: qty 15

## 2021-10-04 MED ORDER — ROCURONIUM BROMIDE 10 MG/ML (PF) SYRINGE
PREFILLED_SYRINGE | INTRAVENOUS | Status: AC
  Filled 2021-10-04: qty 10

## 2021-10-04 MED ORDER — LACTATED RINGERS IV SOLN: INTRAVENOUS | Status: DC

## 2021-10-04 MED ORDER — DEXMEDETOMIDINE (PRECEDEX) IN NS 20 MCG/5ML (4 MCG/ML) IV SYRINGE
PREFILLED_SYRINGE | INTRAVENOUS | Status: DC | PRN
  Administered 2021-10-04: 8 ug via INTRAVENOUS
  Administered 2021-10-04: 12 ug via INTRAVENOUS

## 2021-10-04 MED ORDER — ACETAMINOPHEN 325 MG PO TABS
650.0000 mg | ORAL_TABLET | ORAL | Status: DC | PRN
  Administered 2021-10-04 – 2021-10-05 (×3): 650 mg via ORAL
  Filled 2021-10-04 (×3): qty 2

## 2021-10-04 MED ORDER — CEFAZOLIN SODIUM-DEXTROSE 2-4 GM/100ML-% IV SOLN
2.0000 g | Freq: Three times a day (TID) | INTRAVENOUS | Status: AC
  Administered 2021-10-04 – 2021-10-05 (×2): 2 g via INTRAVENOUS
  Filled 2021-10-04 (×2): qty 100

## 2021-10-04 MED ORDER — DEXAMETHASONE SODIUM PHOSPHATE 10 MG/ML IJ SOLN
INTRAMUSCULAR | Status: DC | PRN
  Administered 2021-10-04: 10 mg via INTRAVENOUS

## 2021-10-04 MED ORDER — INSULIN ASPART 100 UNIT/ML IJ SOLN
0.0000 [IU] | Freq: Every day | INTRAMUSCULAR | Status: DC
  Administered 2021-10-04: 5 [IU] via SUBCUTANEOUS

## 2021-10-04 MED ORDER — ONDANSETRON HCL 4 MG/2ML IJ SOLN
INTRAMUSCULAR | Status: AC
  Filled 2021-10-04: qty 2

## 2021-10-04 MED ORDER — ACETAMINOPHEN 10 MG/ML IV SOLN
INTRAVENOUS | Status: DC | PRN
  Administered 2021-10-04: 1000 mg via INTRAVENOUS

## 2021-10-04 MED ORDER — PHENOL 1.4 % MT LIQD: 1.0000 | OROMUCOSAL | Status: DC | PRN

## 2021-10-04 MED ORDER — SODIUM CHLORIDE 0.9% FLUSH: 3.0000 mL | INTRAVENOUS | Status: DC | PRN

## 2021-10-04 MED ORDER — SODIUM CHLORIDE 0.9% FLUSH: 3.0000 mL | Freq: Two times a day (BID) | INTRAVENOUS | Status: DC

## 2021-10-04 MED ORDER — CEFAZOLIN IN SODIUM CHLORIDE 3-0.9 GM/100ML-% IV SOLN
3.0000 g | INTRAVENOUS | Status: AC
  Administered 2021-10-04: 3 g via INTRAVENOUS
  Filled 2021-10-04: qty 100

## 2021-10-04 MED ORDER — LOSARTAN POTASSIUM 50 MG PO TABS: 100.0000 mg | ORAL_TABLET | Freq: Every day | ORAL | Status: DC

## 2021-10-04 MED ORDER — ORAL CARE MOUTH RINSE: 15.0000 mL | Freq: Once | OROMUCOSAL | Status: AC

## 2021-10-04 MED ORDER — KETOROLAC TROMETHAMINE 30 MG/ML IJ SOLN
INTRAMUSCULAR | Status: DC | PRN
  Administered 2021-10-04: 30 mg via INTRAVENOUS

## 2021-10-04 MED ORDER — BUPIVACAINE LIPOSOME 1.3 % IJ SUSP
INTRAMUSCULAR | Status: AC
  Filled 2021-10-04: qty 20

## 2021-10-04 MED ORDER — FENTANYL CITRATE (PF) 100 MCG/2ML IJ SOLN
INTRAMUSCULAR | Status: AC
  Filled 2021-10-04: qty 2

## 2021-10-04 MED ORDER — MENTHOL 3 MG MT LOZG: 1.0000 | LOZENGE | OROMUCOSAL | Status: DC | PRN

## 2021-10-04 MED ORDER — FENTANYL CITRATE (PF) 250 MCG/5ML IJ SOLN
INTRAMUSCULAR | Status: AC
  Filled 2021-10-04: qty 5

## 2021-10-04 MED ORDER — OXYCODONE HCL 5 MG PO TABS
10.0000 mg | ORAL_TABLET | ORAL | Status: DC | PRN
  Administered 2021-10-04 – 2021-10-05 (×5): 10 mg via ORAL
  Filled 2021-10-04 (×5): qty 2

## 2021-10-04 MED ORDER — HYDROCHLOROTHIAZIDE 25 MG PO TABS: 25.0000 mg | ORAL_TABLET | Freq: Every day | ORAL | Status: DC

## 2021-10-04 MED ORDER — BUPIVACAINE-EPINEPHRINE (PF) 0.5% -1:200000 IJ SOLN
INTRAMUSCULAR | Status: DC | PRN
  Administered 2021-10-04: 10 mL

## 2021-10-04 MED ORDER — HYDROMORPHONE HCL 1 MG/ML IJ SOLN
INTRAMUSCULAR | Status: AC
  Filled 2021-10-04: qty 0.5

## 2021-10-04 MED ORDER — PROPOFOL 10 MG/ML IV BOLUS
INTRAVENOUS | Status: DC | PRN
  Administered 2021-10-04: 180 mg via INTRAVENOUS

## 2021-10-04 MED ORDER — 0.9 % SODIUM CHLORIDE (POUR BTL) OPTIME
TOPICAL | Status: DC | PRN
  Administered 2021-10-04: 1000 mL

## 2021-10-04 MED ORDER — INSULIN ASPART 100 UNIT/ML IJ SOLN
0.0000 [IU] | INTRAMUSCULAR | Status: AC | PRN
  Administered 2021-10-04 (×2): 2 [IU] via SUBCUTANEOUS
  Filled 2021-10-04 (×2): qty 1

## 2021-10-04 MED ORDER — LIDOCAINE-EPINEPHRINE 1 %-1:100000 IJ SOLN
INTRAMUSCULAR | Status: AC
  Filled 2021-10-04: qty 1

## 2021-10-04 MED ORDER — METHOCARBAMOL 1000 MG/10ML IJ SOLN
500.0000 mg | Freq: Four times a day (QID) | INTRAVENOUS | Status: DC | PRN
  Filled 2021-10-04: qty 5

## 2021-10-04 MED ORDER — ONDANSETRON HCL 4 MG PO TABS: 4.0000 mg | ORAL_TABLET | Freq: Four times a day (QID) | ORAL | Status: DC | PRN

## 2021-10-04 MED ORDER — LINAGLIPTIN 5 MG PO TABS
5.0000 mg | ORAL_TABLET | Freq: Every day | ORAL | Status: DC
  Administered 2021-10-05: 5 mg via ORAL
  Filled 2021-10-04: qty 1

## 2021-10-04 MED ORDER — KETOROLAC TROMETHAMINE 15 MG/ML IJ SOLN
15.0000 mg | Freq: Four times a day (QID) | INTRAMUSCULAR | Status: DC
  Administered 2021-10-04 – 2021-10-05 (×3): 15 mg via INTRAVENOUS
  Filled 2021-10-04 (×3): qty 1

## 2021-10-04 MED ORDER — ONDANSETRON HCL 4 MG/2ML IJ SOLN: 4.0000 mg | Freq: Four times a day (QID) | INTRAMUSCULAR | Status: DC | PRN

## 2021-10-04 MED ORDER — GLIPIZIDE ER 10 MG PO TB24
10.0000 mg | ORAL_TABLET | Freq: Every day | ORAL | Status: DC
Start: 2021-10-05 — End: 2021-10-05
  Administered 2021-10-05: 10 mg via ORAL
  Filled 2021-10-04: qty 1

## 2021-10-04 MED ORDER — METHOCARBAMOL 500 MG PO TABS
500.0000 mg | ORAL_TABLET | Freq: Four times a day (QID) | ORAL | Status: DC | PRN
  Administered 2021-10-04 – 2021-10-05 (×3): 500 mg via ORAL
  Filled 2021-10-04 (×3): qty 1

## 2021-10-04 MED ORDER — LIDOCAINE 2% (20 MG/ML) 5 ML SYRINGE
INTRAMUSCULAR | Status: AC
  Filled 2021-10-04: qty 5

## 2021-10-04 MED ORDER — MIDAZOLAM HCL 2 MG/2ML IJ SOLN
INTRAMUSCULAR | Status: DC | PRN
  Administered 2021-10-04: 2 mg via INTRAVENOUS

## 2021-10-04 MED ORDER — BUPIVACAINE LIPOSOME 1.3 % IJ SUSP
INTRAMUSCULAR | Status: DC | PRN
  Administered 2021-10-04: 20 mL

## 2021-10-04 MED ORDER — SUGAMMADEX SODIUM 200 MG/2ML IV SOLN
INTRAVENOUS | Status: DC | PRN
Start: 2021-10-04 — End: 2021-10-04
  Administered 2021-10-04: 400 mg via INTRAVENOUS

## 2021-10-04 MED ORDER — THROMBIN 5000 UNITS EX SOLR
CUTANEOUS | Status: AC
  Filled 2021-10-04: qty 5000

## 2021-10-04 MED ORDER — MIDAZOLAM HCL 2 MG/2ML IJ SOLN
INTRAMUSCULAR | Status: AC
  Filled 2021-10-04: qty 2

## 2021-10-04 SURGICAL SUPPLY — 81 items
ADH SKN CLS APL DERMABOND .7 (GAUZE/BANDAGES/DRESSINGS) ×1
BAG COUNTER SPONGE SURGICOUNT (BAG) ×3 IMPLANT
BAG SPNG CNTER NS LX DISP (BAG) ×1
BAND INSRT 18 STRL LF DISP RB (MISCELLANEOUS) ×2
BAND RUBBER #18 3X1/16 STRL (MISCELLANEOUS) ×6 IMPLANT
BASKET BONE COLLECTION (BASKET) ×1 IMPLANT
BUR MATCHSTICK NEURO 3.0 LAGG (BURR) ×4 IMPLANT
CAGE PL ENDOSKEL 22X11X9 0D (Cage) ×1 IMPLANT
CNTNR URN SCR LID CUP LEK RST (MISCELLANEOUS) ×2 IMPLANT
CONT SPEC 4OZ STRL OR WHT (MISCELLANEOUS) ×2
COVER BACK TABLE 60X90IN (DRAPES) ×3 IMPLANT
COVER MAYO STAND STRL (DRAPES) ×3 IMPLANT
DECANTER SPIKE VIAL GLASS SM (MISCELLANEOUS) ×2 IMPLANT
DERMABOND ADVANCED (GAUZE/BANDAGES/DRESSINGS) ×1
DERMABOND ADVANCED .7 DNX12 (GAUZE/BANDAGES/DRESSINGS) IMPLANT
DRAIN JACKSON RD 7FR 3/32 (WOUND CARE) IMPLANT
DRAPE C-ARM 42X72 X-RAY (DRAPES) ×6 IMPLANT
DRAPE C-ARMOR (DRAPES) IMPLANT
DRAPE LAPAROTOMY 100X72X124 (DRAPES) ×3 IMPLANT
DRAPE MICROSCOPE LEICA (MISCELLANEOUS) ×3 IMPLANT
DRAPE STERI IOBAN 125X83 (DRAPES) IMPLANT
DRAPE SURG 17X23 STRL (DRAPES) ×3 IMPLANT
DRSG OPSITE POSTOP 3X4 (GAUZE/BANDAGES/DRESSINGS) ×2 IMPLANT
DURAPREP 26ML APPLICATOR (WOUND CARE) ×3 IMPLANT
ELECT BLADE INSULATED 6.5IN (ELECTROSURGICAL) ×2
ELECT REM PT RETURN 9FT ADLT (ELECTROSURGICAL) ×2
ELECTRODE BLDE INSULATED 6.5IN (ELECTROSURGICAL) ×2 IMPLANT
ELECTRODE REM PT RTRN 9FT ADLT (ELECTROSURGICAL) ×2 IMPLANT
EXTENDER TAB GUIDE SV 5.5/6.0 (INSTRUMENTS) ×8 IMPLANT
GAUZE 4X4 16PLY ~~LOC~~+RFID DBL (SPONGE) IMPLANT
GAUZE SPONGE 4X4 12PLY STRL (GAUZE/BANDAGES/DRESSINGS) ×3 IMPLANT
GLOVE BIOGEL PI IND STRL 8 (GLOVE) ×4 IMPLANT
GLOVE BIOGEL PI INDICATOR 8 (GLOVE) ×2
GLOVE ECLIPSE 8.0 STRL XLNG CF (GLOVE) ×6 IMPLANT
GLOVE SURG ENC MOIS LTX SZ8 (GLOVE) ×3 IMPLANT
GLOVE SURG UNDER POLY LF SZ8.5 (GLOVE) ×3 IMPLANT
GOWN STRL REUS W/ TWL LRG LVL3 (GOWN DISPOSABLE) IMPLANT
GOWN STRL REUS W/ TWL XL LVL3 (GOWN DISPOSABLE) ×4 IMPLANT
GOWN STRL REUS W/TWL 2XL LVL3 (GOWN DISPOSABLE) IMPLANT
GOWN STRL REUS W/TWL LRG LVL3 (GOWN DISPOSABLE)
GOWN STRL REUS W/TWL XL LVL3 (GOWN DISPOSABLE) ×4
GUIDEWIRE SHARP NITINOL 610 (WIRE) ×4 IMPLANT
HEMOSTAT POWDER KIT SURGIFOAM (HEMOSTASIS) ×3 IMPLANT
KIT BASIN OR (CUSTOM PROCEDURE TRAY) ×3 IMPLANT
KIT INFUSE XX SMALL 0.7CC (Orthopedic Implant) ×1 IMPLANT
KIT POSITION SURG JACKSON T1 (MISCELLANEOUS) ×3 IMPLANT
KIT TURNOVER KIT B (KITS) ×3 IMPLANT
KNIFE SURG 188MM BAYONET LONG (INSTRUMENTS) ×1 IMPLANT
MARKER SKIN DUAL TIP RULER LAB (MISCELLANEOUS) ×3 IMPLANT
NDL ASPIRATION RAN815N 8X15 (NEEDLE) IMPLANT
NDL HYPO 21X1.5 SAFETY (NEEDLE) IMPLANT
NDL HYPO 25X1 1.5 SAFETY (NEEDLE) ×2 IMPLANT
NDL SPNL 22GX3.5 QUINCKE BK (NEEDLE) ×2 IMPLANT
NEEDLE ASPIRATION RAN815N 8X15 (NEEDLE) ×1 IMPLANT
NEEDLE ASPIRATION RANFAC 8X15 (NEEDLE) ×1
NEEDLE HYPO 21X1.5 SAFETY (NEEDLE) ×2 IMPLANT
NEEDLE HYPO 25X1 1.5 SAFETY (NEEDLE) ×2 IMPLANT
NEEDLE SPNL 22GX3.5 QUINCKE BK (NEEDLE) ×4 IMPLANT
NS IRRIG 1000ML POUR BTL (IV SOLUTION) ×3 IMPLANT
PACK LAMINECTOMY NEURO (CUSTOM PROCEDURE TRAY) ×3 IMPLANT
PAD ARMBOARD 7.5X6 YLW CONV (MISCELLANEOUS) ×9 IMPLANT
PATTIES SURGICAL .5 X.5 (GAUZE/BANDAGES/DRESSINGS) IMPLANT
PATTIES SURGICAL .5 X1 (DISPOSABLE) IMPLANT
PATTIES SURGICAL 1X1 (DISPOSABLE) IMPLANT
PUTTY GRAFTON DBF 6CC W/DELIVE (Putty) ×1 IMPLANT
ROD 5.5 CCM PERC 40 (Rod) ×2 IMPLANT
SCREW 5.5 VOYAGER MAS 7.5X50 (Screw) ×2 IMPLANT
SCREW 5.5 VOYAGER MAS 7.5X55 (Screw) ×2 IMPLANT
SCREW SET 5.5/6.0MM SOLERA (Screw) ×4 IMPLANT
SPONGE SURGIFOAM ABS GEL SZ50 (HEMOSTASIS) ×3 IMPLANT
SPONGE T-LAP 4X18 ~~LOC~~+RFID (SPONGE) IMPLANT
STAPLER VISISTAT 35W (STAPLE) IMPLANT
SUT VIC AB 0 CT1 18XCR BRD8 (SUTURE) ×2 IMPLANT
SUT VIC AB 0 CT1 8-18 (SUTURE) ×2
SUT VIC AB 2-0 CP2 18 (SUTURE) ×4 IMPLANT
SUT VIC AB 3-0 SH 8-18 (SUTURE) ×4 IMPLANT
SYR 20ML LL LF (SYRINGE) ×1 IMPLANT
TOWEL GREEN STERILE (TOWEL DISPOSABLE) ×3 IMPLANT
TOWEL GREEN STERILE FF (TOWEL DISPOSABLE) ×3 IMPLANT
TRAY FOLEY MTR SLVR 16FR STAT (SET/KITS/TRAYS/PACK) ×2 IMPLANT
WATER STERILE IRR 1000ML POUR (IV SOLUTION) ×3 IMPLANT

## 2021-10-04 NOTE — Transfer of Care (Signed)
Immediate Anesthesia Transfer of Care Note ? ?Patient: Chad Gardner ? ?Procedure(s) Performed: MINIMALLY INVASIVE DECOMPRESSION AND TRANSFORAMINAL LUMBAR INTERBODY FUSION LUMBAR FIVE-SACRAL ONE, RIGHT SIDED APPROACH (Right) ? ?Patient Location: PACU ? ?Anesthesia Type:General ? ?Level of Consciousness: awake and alert  ? ?Airway & Oxygen Therapy: Patient Spontanous Breathing ? ?Post-op Assessment: Report given to RN and Post -op Vital signs reviewed and stable ? ?Post vital signs: Reviewed and stable ? ?Last Vitals:  ?Vitals Value Taken Time  ?BP 152/99 10/04/21 1506  ?Temp 37.1 ?C 10/04/21 1505  ?Pulse 81 10/04/21 1513  ?Resp 21 10/04/21 1513  ?SpO2 95% 10/04/21 1513  ?Vitals shown include unvalidated device data. ? ?Last Pain:  ?Vitals:  ? 10/04/21 1505  ?TempSrc:   ?PainSc: 10-Worst pain ever  ?   ? ?Patients Stated Pain Goal: 3 (10/04/21 1505) ? ?Complications: No notable events documented. ?

## 2021-10-04 NOTE — Anesthesia Procedure Notes (Signed)
Procedure Name: Intubation ?Date/Time: 10/04/2021 11:37 AM ?Performed by: Lorie Phenix, CRNA ?Pre-anesthesia Checklist: Patient identified, Emergency Drugs available, Suction available and Patient being monitored ?Patient Re-evaluated:Patient Re-evaluated prior to induction ?Oxygen Delivery Method: Circle system utilized ?Preoxygenation: Pre-oxygenation with 100% oxygen ?Induction Type: IV induction ?Ventilation: Mask ventilation without difficulty ?Laryngoscope Size: Mac and 4 ?Grade View: Grade II ?Tube type: Oral ?Tube size: 7.5 mm ?Number of attempts: 1 ?Airway Equipment and Method: Stylet ?Placement Confirmation: ETT inserted through vocal cords under direct vision, positive ETCO2 and breath sounds checked- equal and bilateral ?Secured at: 24 cm ?Tube secured with: Tape ?Dental Injury: Teeth and Oropharynx as per pre-operative assessment  ? ? ? ? ?

## 2021-10-04 NOTE — Anesthesia Postprocedure Evaluation (Signed)
Anesthesia Post Note ? ?Patient: Chad Gardner ? ?Procedure(s) Performed: MINIMALLY INVASIVE DECOMPRESSION AND TRANSFORAMINAL LUMBAR INTERBODY FUSION LUMBAR FIVE-SACRAL ONE, RIGHT SIDED APPROACH (Right) ? ?  ? ?Patient location during evaluation: PACU ?Anesthesia Type: General ?Level of consciousness: awake and alert ?Pain management: pain level controlled ?Vital Signs Assessment: post-procedure vital signs reviewed and stable ?Respiratory status: spontaneous breathing, nonlabored ventilation, respiratory function stable and patient connected to nasal cannula oxygen ?Cardiovascular status: blood pressure returned to baseline and stable ?Postop Assessment: no apparent nausea or vomiting ?Anesthetic complications: no ? ? ?No notable events documented. ? ?Last Vitals:  ?Vitals:  ? 10/04/21 1605 10/04/21 1631  ?BP: 105/77 114/74  ?Pulse: 74 67  ?Resp: 12 16  ?Temp: 37.1 ?C 36.5 ?C  ?SpO2: 96% 92%  ?  ?Last Pain:  ?Vitals:  ? 10/04/21 1631  ?TempSrc: Oral  ?PainSc:   ? ? ?  ?  ?  ?  ?  ?  ? ?[name] ? ? ? ? ?

## 2021-10-04 NOTE — H&P (Signed)
? ? ?Providing Compassionate, Quality Care - Together ? ?NEUROSURGERY HISTORY & PHYSICAL ? ? ?Chad Gardner is an 56 y.o. male.   ?Chief Complaint: Low back pain and right lower extremity radiculopathy ?HPI: This is a 56 year old male with a history of chronic low back pain and right lower extremity radiculopathy, that presents for surgical intervention today.  He has a history of an isthmic mobile spondylolisthesis at L5-S1, failed conservative measures for long-term pain control including steroid injection pain control.  His right lower extremity symptoms are primarily in the L5 dermatome.  MRI showed isthmic spondylolisthesis at L5-S1 with broad-based disc bulging, severe right foraminal stenosis, flexion/extension x-rays show anterior listhesis worsening in flexion imaging at L5-S1 of approximately 12 mm. ? ?Past Medical History:  ?Diagnosis Date  ? Arthritis   ? Diabetes mellitus without complication ([place])   ? Hypertension   ? Overactive bladder   ? Tendonitis of wrist, left   ? ? ?Past Surgical History:  ?Procedure Laterality Date  ? BACK SURGERY    ? ruptured disc  ? SHOULDER ARTHROSCOPY Left 09/24/2019  ? TENDON RELEASE  06/05/2009  ? Lt wrist  ? ? ?Family History  ?Problem Relation Age of Onset  ? Hypertension Mother   ? Stroke Mother   ? Cancer Father   ? Hypertension Brother   ? ?Social History:  reports that he has never smoked. He has never used smokeless tobacco. He reports current alcohol use. He reports that he does not use drugs. ? ?Allergies: No Known Allergies ? ?Medications Prior to Admission  ?Medication Sig Dispense Refill  ? celecoxib (CELEBREX) 200 MG capsule Take 200 mg by mouth 2 (two) times daily.    ? glipiZIDE (GLUCOTROL XL) 10 MG 24 hr tablet Take 1 tablet by mouth once daily with breakfast 90 tablet 1  ? hydrochlorothiazide (HYDRODIURIL) 25 MG tablet Take 1 tablet by mouth once daily 90 tablet 3  ? JANUVIA 100 MG tablet Take 1 tablet by mouth once daily 30 tablet 5  ? losartan  (COZAAR) 100 MG tablet Take 1 tablet by mouth once daily 90 tablet 3  ? metoprolol succinate (TOPROL-XL) 25 MG 24 hr tablet Take 1 tablet by mouth once daily 90 tablet 2  ? Multiple Vitamin (MULTIVITAMIN WITH MINERALS) TABS tablet Take 1 tablet by mouth daily.    ? TURMERIC PO Take 1 capsule by mouth daily.    ? Vibegron (GEMTESA) 75 MG TABS Take 75 mg by mouth daily.    ? ACCU-CHEK AVIVA PLUS test strip USE ONE STRIP TO CHECK GLUCOSE TWICE DAILY 100 each prn  ? Blood Glucose Monitoring Suppl (ACCU-CHEK AVIVA PLUS) W/DEVICE KIT     ? ? ?Results for orders placed or performed during the hospital encounter of 10/04/21 (from the past 48 hour(s))  ?Glucose, capillary     Status: Abnormal  ? Collection Time: 10/04/21  8:09 AM  ?Result Value Ref Range  ? Glucose-Capillary 178 (H) 70 - 99 mg/dL  ?  Comment: Glucose reference range applies only to samples taken after fasting for at least 8 hours.  ?Glucose, capillary     Status: Abnormal  ? Collection Time: 10/04/21 11:00 AM  ?Result Value Ref Range  ? Glucose-Capillary 156 (H) 70 - 99 mg/dL  ?  Comment: Glucose reference range applies only to samples taken after fasting for at least 8 hours.  ? ?No results found. ? ?ROS ?All pertinent positives and negatives are listed HPI above ? ?Blood pressure (!) 142/87, pulse  73, temperature 98.1 ?F (36.7 ?C), temperature source Oral, resp. rate 17, height '6\' 2"'  (1.88 m), weight 124.7 kg, SpO2 96 %. ?Physical Exam  ?Alert oriented x3, no acute distress ?PERRLA ?Cranial nerves II through XII intact ?Speech fluent and appropriate ?Full strength bilateral upper extremities ?Bilateral lower extremities full strength throughout except for plantarflexion on the right 4+/5 ?Decrease sensation light touch in the L5 distribution on the right ? ?Assessment/Plan ?56 year old male with ? ?L5-S1 isthmic spondylolisthesis with severe stenosis and radiculopathy ? ?-OR today for L5-S1 decompression, instrumentation and TLIF.  We discussed all risks,  benefits and expected outcomes as well as alternatives to treatment.  Informed consent was obtained. ? ?Thank you for allowing me to participate in this patient's care.  Please do not hesitate to call with questions or concerns. ? ? ?Petrea Fredenburg, DO ?Neurosurgeon ?[facility] ?Cell: [phone] ? ? ? ? ?

## 2021-10-04 NOTE — Op Note (Signed)
? ?Providing Compassionate, Quality Care - Together ? ?Date of service: 10/04/2021 ? ?PREOP DIAGNOSIS:  ?L5-S1 isthmic spondylolisthesis, mobile, with right lower extremity radiculopathy ?  ?POSTOP DIAGNOSIS: Same ?  ?PROCEDURE: ?Minimally invasive L5-S1 decompression and transforaminal lumbar interbody fusion and arthrodesis, right sided approach ?Bilateral L5, S1 nonsegmental pedicle screw instrumentation, Medtronic Solera 7.5 x 55 mm bilateral L5, 7.5 x 50 mm bilaterally and S1 ?Placement of anterior biomechanical device, Medtronic Titan 11 x 22 mm titanium interbody ?Intraoperative use of autograft, same incision ?Intraoperative use of allograft  ?Use of BMP, xxs ?Intraoperative use of fluoroscopy, greater than 1 hour ?Intraoperative use of microscope, for microdissection ?  ?SURGEON: Dr. Kendell Bane C. Derry Arbogast, DO ?  ?ASSISTANT: Dr. Autumn Patty, MD; Docia Barrier, NP ?  ?ANESTHESIA: General Endotracheal ?  ?EBL: 100 cc ?  ?SPECIMENS: None ?  ?DRAINS: None ?  ?COMPLICATIONS: None ?  ?CONDITION: Hemodynamically stable ?  ?HISTORY: ?Chad Gardner is a 56 y.o. y.o. male who initially presented to the outpatient clinic with signs and symptoms consistent with low back pain and radiculopathy. MRI demonstrated isthmic spondylolisthesis with severe right foraminal stenosis and broad-based disc bulging as well as degenerative disc disease.  Flexion-extension x-rays revealed a mobile spondylolisthesis, worse in flexion up to 12 mm. Treatment options were discussed including continued epidural steroid injections, physical therapy, pain control.  He failed multiple long-term conservative therapies therefore I offered surgical intervention in the form of an L5-S1 decompression, instrumentation and fusion. All risks, benefits and expected outcomes were discussed agreed upon. ?  ?PROCEDURE IN DETAIL: ?The patient was brought to the operating room. After induction of general anesthesia, the patient was positioned on the operative  table in the prone position on the [place] table. All pressure points were meticulously padded. Skin incision was then marked out and prepped and draped in the usual sterile fashion. Physician driven timeout was performed. ?  ?Using biplanar fluoroscopy, the C arm for sterilely draped and brought into the field and the paramedian incisions were planned over the L5-S1 interspace.  Local anesthetic was injected bilaterally.  Using a 10 blade, paramedian incision was created bilaterally.  Using Jamshidi, the bilateral L5 pedicles were accessed under biplanar fluoroscopy.  K wires were placed and the Jamshidi's were removed.  This was repeated bilaterally at S1.  Attention was then turned to docking the Metrix dilator.  Using with a series of dilators, on the patient's right side the facet lamina junction was docked with a 26 x 8 mm tube.  This was confirmed to be in the appropriate trajectory under lateral fluoroscopy.  At this point the microscope was sterilely draped and brought into the field. ?  ?Using Bovie electrocautery, soft tissue was cleared from the lamina and facet at L5-S1.  Using a high-speed drill and collecting the autograft, a laminectomy and complete facetectomy was performed down to the ligamentum flavum.  This autograft was saved for later.  The ligamentum flavum was identified to its attachment along the superior lamina and lateral pars.  Using a series of micro curettes, the ligamentum flavum was gently elevated and the epidural space was identified.  The ligamentum flavum was completely resected using Kerrison rongeurs.  The traversing and exiting nerve roots were identified.  Using Kerrison rongeurs, the common dural tube and traversing and exiting nerve roots were completely decompressed from the ligamentum flavum.  They appeared pulsatile and noncompressed.  Camden's triangle was identified, the traversing nerve root was gently retracted medially, and the epidural space was  coagulated and  cleared of epidural veins.  The disc space was identified.  The disc annulus was then coagulated and incised with an 11 blade.  Using Kerrison rongeurs the annulotomy was widened.  Using a series of disc prep shavers and curettes a radical discectomy was performed to subchondral bleeding bone at both endplates.  The disc base was then trialed and found to be in 11 mm interbody size.  A mixture of autograft, bmp and allograft was then placed anteriorly and medially and packed with a bone tamp.  Using a nerve root retractor, the traversing nerve root was gently protected during placement of the interbody. ?  ?Using lateral fluoroscopy, the interbody was then placed under live fluoroscopy.  Appropriate placement was identified.  The remainder of the autograft and allograft was then placed laterally and the intervertebral space to the interbody device.  Hemostasis was achieved with passive hemostatics and bipolar cautery.  The traversing nerve root,, neural tube and exiting nerve root were followed with a ball-tipped probe and noted to be noncompressed, pulsatile and in their normal position.  A few pieces of Gelfoam with thrombin were placed over the thecal sac.  The Metrix dilator tube was gently removed and hemostasis was achieved with bipolar cautery and the soft tissues. ?  ?The above listed pedicle screws were then placed under lateral fluoroscopy over the K wires bilaterally at L5 and S1.  Appropriate sized rods were measured, contoured and placed percutaneously.  These were confirmed to be within the t[place]ips, setscrews were placed and final tightened to the manufacturer's recommendations.  Percutaneous towers were removed from the screws.  Final AP and lateral fluoroscopy images confirmed appropriate placement of all hardware.  Hemostasis was achieved with bipolar cautery.  The wound was closed in layers, 2-0 and 3-0 Vicryl sutures for the dermis.  Skin was closed with skin glue.  Sterile dressing was applied. ?   ?At the end of the case all sponge, needle, and instrument counts were correct. The patient was then transferred to the stretcher, extubated, and taken to the post-anesthesia care unit in stable hemodynamic condition. ? ?

## 2021-10-05 DIAGNOSIS — M4317 Spondylolisthesis, lumbosacral region: Secondary | ICD-10-CM | POA: Diagnosis not present

## 2021-10-05 LAB — GLUCOSE, CAPILLARY: Glucose-Capillary: 171 mg/dL — ABNORMAL HIGH (ref 70–99)

## 2021-10-05 NOTE — Plan of Care (Signed)

## 2021-10-05 NOTE — Evaluation (Signed)
Physical Therapy Evaluation and Discharge ? ?Patient Details ?Name: Chad Gardner ?MRN: [id number] ?DOB: [birth year] ?Today's Date: 10/05/2021 ? ?History of Present Illness ? Pt is a 56 y/o M presenting with chronic low back pain, s/p decompression and transforaminal lumbar interbody fusion L5-S1. PMH includes arthritis, DM, and HTN. ?  ?Clinical Impression ? Patient evaluated by Physical Therapy with no further acute PT needs identified. All education has been completed and the patient has no further questions. Pt was able to demonstrate transfers and ambulation with gross supervision for safety and RW for support. Pt was educated on precautions, brace application/wearing schedule, appropriate activity progression, and car transfer. See below for any follow-up Physical Therapy or equipment needs. PT is signing off. Thank you for this referral.    ?   ? ?Recommendations for follow up therapy are one component of a multi-disciplinary discharge planning process, led by the attending physician.  Recommendations may be updated based on patient status, additional functional criteria and insurance authorization. ? ?Follow Up Recommendations No PT follow up ? ?  ?Assistance Recommended at Discharge PRN  ?Patient can return home with the following ? A little help with walking and/or transfers;Assistance with cooking/housework;Assist for transportation;Help with stairs or ramp for entrance ? ?  ?Equipment Recommendations None recommended by PT (Pt reports he can borrow a RW from his father)  ?Recommendations for Other Services ?    ?  ?Functional Status Assessment Patient has had a recent decline in their functional status and demonstrates the ability to make significant improvements in function in a reasonable and predictable amount of time.  ? ?  ?Precautions / Restrictions Precautions ?Precautions: Back ?Precaution Booklet Issued: Yes (comment) ?Precaution Comments: reviewed 3/3 precautions with pt ?Required Braces or  Orthoses: Spinal Brace ?Spinal Brace: Lumbar corset;Applied in sitting position ?Restrictions ?Weight Bearing Restrictions: No  ? ?  ? ?Mobility ? Bed Mobility ?  ?  ?  ?  ?  ?  ?  ?General bed mobility comments: Pt was received sitting EOB however we verbally reviewed log roll technique and pt reports confidence in performing. ?  ? ?Transfers ?Overall transfer level: Needs assistance ?Equipment used: None ?Transfers: Sit to/from Stand ?Sit to Stand: Min guard, Supervision ?  ?  ?  ?  ?  ?General transfer comment: Light guard progressing to supervision for safety. He demonstrated proper hand placement on seated surface for safety. ?  ? ?Ambulation/Gait ?Ambulation/Gait assistance: Supervision ?Gait Distance (Feet): 400 Feet ?Assistive device: Rolling walker (2 wheels), None ?Gait Pattern/deviations: Step-through pattern, Decreased stride length, Trunk flexed ?Gait velocity: Decreased ?Gait velocity interpretation: <1.31 ft/sec, indicative of household ambulator ?  ?General Gait Details: Slow and guarded with light supervision provided for safety. Initially without RW but reports decreased pain with RW use. No overt LOB noted. ? ?Stairs ?Stairs: Yes ?Stairs assistance: Min guard, Supervision ?Stair Management: One rail Left, Step to pattern, Forwards ?Number of Stairs: 7 ?General stair comments: VC's for sequencing and general safety. ? ?Wheelchair Mobility ?  ? ?Modified Rankin (Stroke Patients Only) ?  ? ?  ? ?Balance Overall balance assessment: Mild deficits observed, not formally tested ?  ?  ?  ?  ?  ?  ?  ?  ?  ?  ?  ?  ?  ?  ?  ?  ?  ?  ?   ? ? ? ?Pertinent Vitals/Pain Pain Assessment ?Pain Assessment: Faces ?Faces Pain Scale: Hurts a little bit ?Pain Location: back with mobiltiy ?Pain Descriptors /  Indicators: Constant, Discomfort ?Pain Intervention(s): Limited activity within patient's tolerance, Monitored during session, Repositioned  ? ? ?Home Living Family/patient expects to be discharged to:: Private  residence ?Living Arrangements: Alone ?Available Help at Discharge: Family ?Type of Home: House ?Home Access: Stairs to enter ?Entrance Stairs-Rails: Left ?Entrance Stairs-Number of Steps: 3 ?  ?Home Layout: One level ?Home Equipment: Other (comment) (has access to RW) ?   ?  ?Prior Function Prior Level of Function : Independent/Modified Independent ?  ?  ?  ?  ?  ?  ?Mobility Comments: no AD use ?ADLs Comments: does IADLs ?  ? ? ?Hand Dominance  ?   ? ?  ?Extremity/Trunk Assessment  ? Upper Extremity Assessment ?Upper Extremity Assessment: Overall WFL for tasks assessed ?  ? ?Lower Extremity Assessment ?Lower Extremity Assessment: Generalized weakness (Mild; consistent with pre-op diagnosis) ?  ? ?Cervical / Trunk Assessment ?Cervical / Trunk Assessment: Back Surgery  ?Communication  ? Communication: No difficulties  ?Cognition Arousal/Alertness: Awake/alert ?Behavior During Therapy: [facility] for tasks assessed/performed ?Overall Cognitive Status: Within Functional Limits for tasks assessed ?  ?  ?  ?  ?  ?  ?  ?  ?  ?  ?  ?  ?  ?  ?  ?  ?  ?  ?  ? ?  ?General Comments   ? ?  ?Exercises    ? ?Assessment/Plan  ?  ?PT Assessment Patient does not need any further PT services  ?PT Problem List   ? ?   ?  ?PT Treatment Interventions     ? 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 ?PT Goal Formulation: All assessment and education complete, DC therapy ? ?  ?Frequency   ?  ? ? ?Co-evaluation   ?  ?  ?  ?  ? ? ?  ?AM-PAC PT "6 Clicks" Mobility  ?Outcome Measure Help needed turning from your back to your side while in a flat bed without using bedrails?: None ?Help needed moving from lying on your back to sitting on the side of a flat bed without using bedrails?: None ?Help needed moving to and from a bed to a chair (including a wheelchair)?: A Little ?Help needed standing up from a chair using your arms  (e.g., wheelchair or bedside chair)?: A Little ?Help needed to walk in hospital room?: A Little ?Help needed climbing 3-5 steps with a railing? : A Little ?6 Click Score: 20 ? ?  ?End of Session Equipment Utilized During Treatment: Gait belt;Back brace ?Activity Tolerance: Patient tolerated treatment well ?Patient left: in bed;with call bell/phone within reach;with family/visitor present ?Nurse Communication: Mobility status ?PT Visit Diagnosis: Unsteadiness on feet (R26.81);Pain ?Pain - part of body:  (back) ?  ? ?Time: 3606359920 ?PT Time Calculation (min) (ACUTE ONLY): 26 min ? ? ?Charges:   PT Evaluation ?$PT Eval Low Complexity: 1 Low ?PT Treatments ?$Gait Training: 8-22 mins ?  ?   ? ? ?Conni Slipper, PT, DPT ?Acute Rehabilitation Services ?Secure Chat Preferred ?Office: [phone]  ? ?Marylynn Pearson ?10/05/2021, 1:27 PM ? ?

## 2021-10-05 NOTE — Evaluation (Signed)
Occupational Therapy Evaluation ?Patient Details ?Name: Chad Gardner ?MRN: [id number] ?DOB: December 10, 1965 ?Today's Date: 10/05/2021 ? ? ?History of Present Illness Pt is a 56 y/o M presenting with chronic low back pain, s/p decompression and transforaminal lumbar interbody fusion L5-S1. PMH includes arthritis, DM, and HTN.  ? ?Clinical Impression ?  ?PTA, pt independent with ADLs and functional mobility, lives alone and plans to have son stay with him for 1 day, and then other family members afterward. Pt currently mod I - min A for ADLs, min guard for bed mobility, and min guard for transfers without AD. Educated pt on back precautions, along with compensatory strategies for dressing, grooming, bathing, and bed mobility. Pt verbalized understanding and adheres to precautions throughout session. Pt presenting with impairments listed below, however has no acute OT needs at this time. Will s/o. Recommend d/c home with family assistance. ?   ? ?Recommendations for follow up therapy are one component of a multi-disciplinary discharge planning process, led by the attending physician.  Recommendations may be updated based on patient status, additional functional criteria and insurance authorization.  ? ?Follow Up Recommendations ? No OT follow up  ?  ?Assistance Recommended at Discharge Set up Supervision/Assistance  ?Patient can return home with the following A little help with bathing/dressing/bathroom;Assistance with cooking/housework;Help with stairs or ramp for entrance ? ?  ?Functional Status Assessment ? Patient has had a recent decline in their functional status and demonstrates the ability to make significant improvements in function in a reasonable and predictable amount of time.  ?Equipment Recommendations ? None recommended by OT  ?  ?Recommendations for Other Services PT consult ? ? ?  ?Precautions / Restrictions Precautions ?Precautions: Back ?Precaution Booklet Issued: Yes (comment) ?Precaution Comments:  reviewed 3/3 precautions with pt ?Required Braces or Orthoses:  (no brace needed per MD at time of eval, however noted brace orders have been put in) ?Restrictions ?Weight Bearing Restrictions: No  ? ?  ? ?Mobility Bed Mobility ?Overal bed mobility: Needs Assistance ?Bed Mobility: Sidelying to Sit, Sit to Sidelying ?  ?Sidelying to sit: Min guard ?  ?  ?Sit to sidelying: Min guard ?General bed mobility comments: performed mulitple times to practice log rolling technique ?  ? ?Transfers ?Overall transfer level: Needs assistance ?Equipment used: None ?Transfers: Sit to/from Stand ?Sit to Stand: Min guard ?  ?  ?  ?  ?  ?  ?  ? ?  ?Balance Overall balance assessment: Mild deficits observed, not formally tested ?  ?  ?  ?  ?  ?  ?  ?  ?  ?  ?  ?  ?  ?  ?  ?  ?  ?  ?   ? ?ADL either performed or assessed with clinical judgement  ? ?ADL Overall ADL's : Needs assistance/impaired ?Eating/Feeding: Modified independent;Sitting ?  ?Grooming: Modified independent;Sitting;Standing ?  ?Upper Body Bathing: Sitting;Supervision/ safety ?  ?Lower Body Bathing: Minimal assistance;Sitting/lateral leans;Sit to/from stand;Cueing for back precautions ?  ?Upper Body Dressing : Supervision/safety;Sitting ?  ?Lower Body Dressing: Minimal assistance;Adhering to back precautions;Sitting/lateral leans;Sit to/from stand ?  ?Toilet Transfer: Supervision/safety;Ambulation;Regular Toilet ?Toilet Transfer Details (indicate cue type and reason): use of rails ?Toileting- Clothing Manipulation and Hygiene: Supervision/safety;Sitting/lateral lean;Sit to/from stand ?  ?Tub/ Shower Transfer: Supervision/safety;Ambulation;Tub transfer ?Tub/Shower Transfer Details (indicate cue type and reason): simulated stepping over elevated surface in room ?Functional mobility during ADLs: Supervision/safety ?   ? ? ? ?Vision   ?Vision Assessment?: No apparent visual deficits  ?   ?  Perception   ?  ?Praxis   ?  ? ?Pertinent Vitals/Pain Pain Assessment ?Pain Assessment:  Faces ?Pain Score: 3  ?Faces Pain Scale: Hurts little more ?Pain Location: back with mobiltiy ?Pain Descriptors / Indicators: Constant, Discomfort ?Pain Intervention(s): Limited activity within patient's tolerance, Monitored during session, Repositioned  ? ? ? ?Hand Dominance   ?  ?Extremity/Trunk Assessment Upper Extremity Assessment ?Upper Extremity Assessment: Overall WFL for tasks assessed ?  ?Lower Extremity Assessment ?Lower Extremity Assessment: Defer to PT evaluation ?  ?Cervical / Trunk Assessment ?Cervical / Trunk Assessment: Back Surgery ?  ?Communication Communication ?Communication: No difficulties ?  ?Cognition Arousal/Alertness: Awake/alert ?Behavior During Therapy: [facility] for tasks assessed/performed ?Overall Cognitive Status: Within Functional Limits for tasks assessed ?  ?  ?  ?  ?  ?  ?  ?  ?  ?  ?  ?  ?  ?  ?  ?  ?  ?  ?  ?General Comments  VSS on RA, son in room throughout session ? ?  ?Exercises   ?  ?Shoulder Instructions    ? ? ?Home Living Family/patient expects to be discharged to:: Private residence ?Living Arrangements: Alone ?Available Help at Discharge: Family ?Type of Home: House ?Home Access: Stairs to enter ?Entrance Stairs-Number of Steps: 3 ?  ?Home Layout: One level ?  ?  ?Bathroom Shower/Tub: Tub/shower unit;Curtain ?  ?Bathroom Toilet: Standard ?  ?  ?Home Equipment: Other (comment) (has access to RW) ?  ?  ?  ? ?  ?Prior Functioning/Environment Prior Level of Function : Independent/Modified Independent ?  ?  ?  ?  ?  ?  ?Mobility Comments: no AD use ?ADLs Comments: does IADLs ?  ? ?  ?  ?OT Problem List: Decreased strength;Decreased range of motion;Decreased activity tolerance;Impaired balance (sitting and/or standing) ?  ?   ?OT Treatment/Interventions:    ?  ?OT Goals(Current goals can be found in the care plan section) Acute Rehab OT Goals ?Patient Stated Goal: to go home ?OT Goal Formulation: With patient ?Time For Goal Achievement: 10/19/21 ?Potential to Achieve Goals: Good   ?OT Frequency:   ?  ? ?Co-evaluation   ?  ?  ?  ?  ? ?  ?AM-PAC OT "6 Clicks" Daily Activity     ?Outcome Measure Help from another person eating meals?: None ?Help from another person taking care of personal grooming?: None ?Help from another person toileting, which includes using toliet, bedpan, or urinal?: A Little ?Help from another person bathing (including washing, rinsing, drying)?: A Little ?Help from another person to put on and taking off regular upper body clothing?: None ?Help from another person to put on and taking off regular lower body clothing?: A Little ?6 Click Score: 21 ?  ?End of Session Nurse Communication: Mobility status ? ?Activity Tolerance: Patient tolerated treatment well ?Patient left: in bed;with call bell/phone within reach;with family/visitor present (sitting EOB) ? ?OT Visit Diagnosis: Other abnormalities of gait and mobility (R26.89);Unsteadiness on feet (R26.81);Muscle weakness (generalized) (M62.81)  ?              ?Time: 3762-8315 ?OT Time Calculation (min): 22 min ?Charges:  OT General Charges ?$OT Visit: 1 Visit ?OT Evaluation ?$OT Eval Low Complexity: 1 Low ? ?Alfonzo Beers, OTD, OTR/L ?Acute Rehab ?[phone] ? ?Mayer Masker ?10/05/2021, 9:42 AM ?

## 2021-10-05 NOTE — Discharge Instructions (Signed)
Wound Care REMOVE DRESSING IN 3 DAYS Leave incision open to air. You may shower. Do not scrub directly on incision.  Do not put any creams, lotions, or ointments on incision. Activity Walk each and every day, increasing distance each day. No lifting greater than 5 lbs.  Avoid bending, arching, and twisting. No driving for 2 weeks; may ride as a passenger locally. If provided with back brace, wear when out of bed.  It is not necessary to wear in bed. Diet Resume your normal diet.  Return to Work Will be discussed at you follow up appointment. Call Your Doctor If Any of These Occur Redness, drainage, or swelling at the wound.  Temperature greater than 101 degrees. Severe pain not relieved by pain medication. Incision starts to come apart. Follow Up Appt Call today for appointment in 2 weeks ([phone]) or for problems.  If you have any hardware placed in your spine, you will need an x-ray before your appointment. 

## 2021-10-05 NOTE — Progress Notes (Signed)
Patient alert and oriented, mae's well, voiding adequate amount of urine, swallowing without difficulty, no c/o pain at time of discharge. Patient discharged home with family. Script and discharged instructions given to patient. Patient and family stated understanding of instructions given. Patient has an appointment with Dr Dawley in 2 weeks 

## 2021-10-05 NOTE — Discharge Summary (Signed)
Physician Discharge Summary  ?Patient ID: ?Chad Gardner ?MRN: [id number] ?DOB/AGE: 1965/12/06 56 y.o. ? ?Admit date: 10/04/2021 ?Discharge date: 10/05/2021 ? ?Admission Diagnoses: L5-S1 isthmic spondylolisthesis, mobile, with right lower extremity radiculopathy ? ?Discharge Diagnoses: L5-S1 isthmic spondylolisthesis, mobile, with right lower extremity radiculopathy ?Principal Problem: ?  Spondylolisthesis, lumbar region ? ? ?Discharged Condition: good ? ?Hospital Course: The patient was admitted on 10/04/2021 and taken to the operating room where the patient underwent L5-S1 decompression, instrumentation and TLIF. The patient tolerated the procedure well and was taken to the recovery room and then to the floor in stable condition. The hospital course was routine. There were no complications. The wound remained clean dry and intact. Pt had appropriate back soreness. No complaints of leg pain or new N/T/W. The patient remained afebrile with stable vital signs, and tolerated a regular diet. The patient continued to increase activities, and pain was well controlled with oral pain medications. ? ? ?Consults: None ? ?Significant Diagnostic Studies: radiology: X-Ray: intraoperative ? ?Treatments: surgery:  ?Minimally invasive L5-S1 decompression and transforaminal lumbar interbody fusion and arthrodesis, right sided approach ?Bilateral L5, S1 nonsegmental pedicle screw instrumentation, Medtronic Solera 7.5 x 55 mm bilateral L5, 7.5 x 50 mm bilaterally and S1 ?Placement of anterior biomechanical device, Medtronic Titan 11 x 22 mm titanium interbody ?Intraoperative use of autograft, same incision ?Intraoperative use of allograft  ?Use of BMP, xxs ?Intraoperative use of fluoroscopy, greater than 1 hour ?Intraoperative use of microscope, for microdissection ? ?Discharge Exam: ?Blood pressure 106/69, pulse (!) 57, temperature 98.3 ?F (36.8 ?C), temperature source Oral, resp. rate 18, height _0  (1.88 m), weight 124.7 kg, SpO2 98  %. ?Physical Exam: Patient is awake, A/O X 4, conversant, and in good spirits. Eyes open spontaneously. They are in NAD and VSS. Doing well. Speech is fluent and appropriate. MAEW with good strength that is symmetric bilaterally.  BUE 5/5 throughout, BLE 5/5 throughout. Sensation to light touch is intact. PERLA, EOMI. CNs grossly intact. Dressings are clean dry intact. Incisions are well approximated with no drainage, erythema, or edema. ? ? ? ?Disposition: Discharge disposition: 01-Home or Self Care ? ? ? ? ? ? ?Discharge Instructions   ? ? Incentive spirometry RT   Complete by: As directed ?  ? ?  ? ?Allergies as of 10/05/2021   ?No Known Allergies ?  ? ?  ?Medication List  ?  ? ?STOP taking these medications   ? ?celecoxib 200 MG capsule ?Commonly known as: CELEBREX ?  ? ?  ? ?TAKE these medications   ? ?Accu-Chek Aviva Plus test strip ?Generic drug: glucose blood ?USE ONE STRIP TO CHECK GLUCOSE TWICE DAILY ?  ?Accu-Chek Aviva Plus w/Device Kit ?  ?Gemtesa 75 MG Tabs ?Generic drug: Vibegron ?Take 75 mg by mouth daily. ?  ?glipiZIDE 10 MG 24 hr tablet ?Commonly known as: GLUCOTROL XL ?Take 1 tablet by mouth once daily with breakfast ?  ?hydrochlorothiazide 25 MG tablet ?Commonly known as: HYDRODIURIL ?Take 1 tablet by mouth once daily ?  ?Januvia 100 MG tablet ?Generic drug: sitaGLIPtin ?Take 1 tablet by mouth once daily ?  ?losartan 100 MG tablet ?Commonly known as: COZAAR ?Take 1 tablet by mouth once daily ?  ?metoprolol succinate 25 MG 24 hr tablet ?Commonly known as: TOPROL-XL ?Take 1 tablet by mouth once daily ?  ?multivitamin with minerals Tabs tablet ?Take 1 tablet by mouth daily. ?  ?TURMERIC PO ?Take 1 capsule by mouth daily. ?  ? ?  ? ? ? ?  Signed: ?Marvis Moeller, DNP, NP-C ?10/05/2021, 8:59 AM ? ? ?

## 2021-10-05 NOTE — Progress Notes (Signed)
Orthopedic Tech Progress Note ?Patient Details:  ?Chad Gardner ?18-Jul-1965 ?[id number] ? ?Dropped off LSO BRACE, patient was working with THERAPY at the time. ? ?Ortho Devices ?Type of Ortho Device: Lumbar corsett ?Ortho Device/Splint Location: BACK ?Ortho Device/Splint Interventions: Ordered ?  ?Post Interventions ?Patient Tolerated: Well ?Instructions Provided: Care of device ? ?[name] ?10/05/2021, 9:15 AM ? ?

## 2021-10-10 ENCOUNTER — Encounter (HOSPITAL_COMMUNITY): Payer: Self-pay | Admitting: Neurological Surgery

## 2021-12-22 ENCOUNTER — Other Ambulatory Visit: Payer: Self-pay | Admitting: Internal Medicine

## 2022-01-03 NOTE — Therapy (Signed)
OUTPATIENT PHYSICAL THERAPY EVALUATION   Patient Name: Chad Gardner MRN: [id number] DOB:[birth year], 56 y.o., male Today's Date: 01/06/2022   PT End of Session - 01/06/22 0952     Visit Number 1    Number of Visits 9    Date for PT Re-Evaluation 03/03/22    Authorization Type BCBS    PT Start Time 0945    PT Stop Time 1030    PT Time Calculation (min) 45 min    Activity Tolerance Patient tolerated treatment well    Behavior During Therapy WFL for tasks assessed/performed             Past Medical History:  Diagnosis Date   Arthritis    Diabetes mellitus without complication (HCC)    Hypertension    Overactive bladder    Tendonitis of wrist, left    Past Surgical History:  Procedure Laterality Date   BACK SURGERY     ruptured disc   SHOULDER ARTHROSCOPY Left 09/24/2019   TENDON RELEASE  06/05/2009   Lt wrist   TRANSFORAMINAL LUMBAR INTERBODY FUSION W/ MIS 1 LEVEL Right 10/04/2021   Procedure: MINIMALLY INVASIVE DECOMPRESSION AND TRANSFORAMINAL LUMBAR INTERBODY FUSION LUMBAR FIVE-SACRAL ONE, RIGHT SIDED APPROACH;  Surgeon: Bethann Goo, DO;  Location: [facility] OR;  Service: Neurosurgery;  Laterality: Right;  3C   Patient Active Problem List   Diagnosis Date Noted   Spondylolisthesis, lumbar region 10/04/2021   Anterolisthesis 12/15/2015   Controlled diabetes mellitus type II without complication (HCC) 10/23/2014   Erectile dysfunction 10/19/2012   Type 2 diabetes mellitus (HCC) 05/12/2012   Right inguinal hernia 08/01/2011   Bilateral renal cysts 08/01/2011   OVERWEIGHT 01/22/2009   ESSENTIAL HYPERTENSION, BENIGN 09/11/2006    PCP: Margaree Mackintosh, MD  REFERRING PROVIDER: Dawley, Alan Mulder, DO  REFERRING DIAG: Spondylolisthesis of lumbar region  Rationale for Evaluation and Treatment Rehabilitation  THERAPY DIAG:  Other low back pain  Muscle weakness (generalized)  Other abnormalities of gait and mobility  ONSET DATE: 10/04/2021   SUBJECTIVE:          SUBJECTIVE STATEMENT: Patient reports around 2017 he had a ruptured disc and pars defect so he had surgery. Then about 5 years later he was having severe pain going down his right leg so on 10/04/2021 he has lumbar fusion and artificial filler in the disc area. Currently he is feeling stiffness in his lower back, he has trouble getting up out of the chair. He has been walking and now he is having some soreness in the back of his right heel region that bothers him if he is walking. He uses a walking stick when walking long distance. Also, at night time when he gets up to go to bathroom he has some leakage when he pushes to get up.  PERTINENT HISTORY:  L5-S1 decompression and TLIF 10/04/2021, DM, HTN  PAIN:  Are you having pain? Yes:  NPRS scale: 6/10 Pain location: Low back Pain description: Intermittent, stiffness, soreness Aggravating factors: Sitting, standing, walking extended periods, standing from chair Relieving factors: Ice, medication  PRECAUTIONS: None  WEIGHT BEARING RESTRICTIONS No  FALLS:  Has patient fallen in last 6 months? 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   OBJECTIVE:  PATIENT SURVEYS:  FOTO 50% functional status  SCREENING FOR RED FLAGS: Negative  COGNITION: Overall cognitive status: Within functional limits for tasks assessed     SENSATION: WFL  MUSCLE LENGTH: Hamstring and quad/hip flexor flexibility deficit  POSTURE:   Rounded shoulder and forward head,   PALPATION: Tender to palpation lumbar paraspinals  LUMBAR ROM:   Active  AROM  eval  Flexion 25%  Extension 50%  Right lateral flexion 50%  Left lateral flexion 50%  Right rotation 50%  Left rotation 50%    Patient reports pain  mainly in lumbar flexion  LOWER EXTREMITY ROM:     Hip PROM grossly WFL  LOWER EXTREMITY MMT:    MMT Right eval Left eval  Hip flexion 4 4  Hip extension 3- 3-  Hip abduction 3- 3-  Knee flexion 5 5  Knee extension 5 5  Ankle dorsiflexion 5 5  Ankle plantarflexion 3 4   FUNCTIONAL TESTS:  Sit to stand: patient requires UE support to stand, demonstrates stiffness with standing, reports low back pain with pushing to stand  GAIT: Assistive device utilized: None Level of assistance: Complete Independence Comments: Antalgic on right, decreased trunk rotation   TODAY'S TREATMENT  Hooklying SKTC stretch 2 x 20 sec each Piriformis stretch 2 x 20 sec each LTR 10 x 5 sec SLR partial range x 10 each Bridge partial range x 10 Side clamshell with green x 10 each Seated hamstring stretch 2 x 20 sec each   PATIENT EDUCATION:  Education details: Exam findings, POC, HEP Person educated: Patient Education method: Explanation, Demonstration, Tactile cues, Verbal cues, and Handouts Education comprehension: verbalized understanding, returned demonstration, verbal cues required, tactile cues required, and needs further education  HOME EXERCISE PROGRAM: Access Code: [email or web address]   ASSESSMENT: CLINICAL IMPRESSION: Patient is a 56 y.o. male who was seen today for physical therapy evaluation and treatment for low back pain and s/p L5-SI decompression and TLIF on 10/04/2021. Current impairments include limitations in lumbar mobility, especially flexion, and gross strength deficits of the core and hips contributing to decreased ability to perform functional movement such as standing from a chair without significant use of UE for support.   OBJECTIVE IMPAIRMENTS Abnormal gait, decreased activity tolerance, decreased ROM, decreased strength, impaired flexibility, improper body mechanics, postural dysfunction, and pain.   ACTIVITY LIMITATIONS carrying, lifting, bending, sitting, standing, squatting,  continence, and locomotion level  PARTICIPATION LIMITATIONS: meal prep, cleaning, laundry, shopping, community activity, occupation, and yard work  PERSONAL Land, Past/current experiences, Profession, and Time since onset of injury/illness/exacerbation are also affecting patient's functional outcome.   REHAB POTENTIAL: Good  CLINICAL DECISION MAKING: Stable/uncomplicated  EVALUATION COMPLEXITY: Low   GOALS: Goals reviewed with patient? Yes  SHORT TERM GOALS: Target date: 02/03/2022  Patient will be I with initial HEP in order to progress with therapy. Baseline: HEP provided at eval Goal status: INITIAL  2.  PT will review FOTO with patient by 3rd visit in order to understand expected progress and outcome with therapy. Baseline: FOTO assessed at eval Goal status: INITIAL  3.  Patient will report pain level </= 3/10 in order to reduce functional limitations with walking. Baseline: 6/10 pain level Goal status: INITIAL  4. Patient will be able to perform sit to stand using proper technique and without using UE in order to indicate improve LE strength and mobility  Baseline: patient requires UE to perform sit to stand  Goal Status: INITIAL  LONG TERM GOALS: Target date: 03/03/2022  Patient will be I with final HEP to maintain  progress from PT. Baseline: HEP provided at eval Goal status: INITIAL  2.  Patient will report >/= 56% status on FOTO to indicate improved functional ability. Baseline: 50% functional status Goal status: INITIAL  3.  Patient will demonstrate core and hip strength >/= 4/5 MMT in order to reduce limitations with lifting so he can return to work without limitations Baseline: core and hip strength grossly 3-/5 MMT Goal status: INITIAL  4.  Patient will demonstrate lumbar flexion >/= 50% without increased pain in order to improve ability to bend or stoop without limitation to return to work Baseline: lumbar flexion grossly 25% with increased  pain Goal status: INITIAL   PLAN: PT FREQUENCY: 1-2x/week  PT DURATION: 8 weeks  PLANNED INTERVENTIONS: Therapeutic exercises, Therapeutic activity, Neuromuscular re-education, Balance training, Gait training, Patient/Family education, Self Care, Joint mobilization, Joint manipulation, Aquatic Therapy, Dry Needling, Electrical stimulation, Spinal manipulation, Spinal mobilization, Cryotherapy, Moist heat, Taping, Manual therapy, and Re-evaluation.  PLAN FOR NEXT SESSION: Review HEP and progress PRN, stretching for lumbar mobility, hamstring and quad/hip flexor stretching, progress core and hip strengthening, lifting mechanics    Rosana Hoes, PT, DPT, LAT, ATC 01/06/22  11:30 AM Phone: [phone] Fax: [phone]

## 2022-01-04 ENCOUNTER — Other Ambulatory Visit: Payer: Self-pay | Admitting: Internal Medicine

## 2022-01-06 ENCOUNTER — Ambulatory Visit: Payer: BC Managed Care – PPO | Attending: Neurological Surgery | Admitting: Physical Therapy

## 2022-01-06 ENCOUNTER — Encounter: Payer: Self-pay | Admitting: Physical Therapy

## 2022-01-06 ENCOUNTER — Other Ambulatory Visit: Payer: Self-pay

## 2022-01-06 ENCOUNTER — Other Ambulatory Visit: Payer: BC Managed Care – PPO

## 2022-01-06 DIAGNOSIS — M6281 Muscle weakness (generalized): Secondary | ICD-10-CM | POA: Diagnosis present

## 2022-01-06 DIAGNOSIS — R2689 Other abnormalities of gait and mobility: Secondary | ICD-10-CM

## 2022-01-06 DIAGNOSIS — M5459 Other low back pain: Secondary | ICD-10-CM | POA: Diagnosis present

## 2022-01-06 DIAGNOSIS — Z13220 Encounter for screening for lipoid disorders: Secondary | ICD-10-CM

## 2022-01-06 DIAGNOSIS — E119 Type 2 diabetes mellitus without complications: Secondary | ICD-10-CM

## 2022-01-06 NOTE — Patient Instructions (Signed)
Access Code: [email or web address] URL: [email or web address] Date: 01/06/2022 Prepared by: Rosana Hoes  Exercises - Hooklying Single Knee to Chest Stretch  - 2 x daily - 3 reps - 20 seconds hold - Supine Piriformis Stretch with Foot on Ground  - 2 x daily - 3 reps - 20 seconds hold - Supine Lower Trunk Rotation  - 2 x daily - 10 reps - 5 seconds hold - Straight Leg Raise  - 1 x daily - 2 sets - 8-10 reps - Bridge  - 1 x daily - 2 sets - 8-10 reps - Clamshell with Resistance  - 1 x daily - 2 sets - 10 reps - Seated Hamstring Stretch  - 2 x daily - 3 reps - 20 seconds hold

## 2022-01-06 NOTE — Addendum Note (Signed)
Addended by: Mary Sella D on: 01/06/2022 09:11 AM   Modules accepted: Orders

## 2022-01-07 LAB — LIPID PANEL
Cholesterol: 143 mg/dL (ref <200)
HDL: 41 mg/dL (ref >=40)
LDL Cholesterol (Calc): 86 mg/dL (calc)
Non-HDL Cholesterol (Calc): 102 mg/dL (calc) (ref <130)
Total CHOL/HDL Ratio: 3.5 (calc) (ref <5.0)
Triglycerides: 74 mg/dL (ref <150)

## 2022-01-07 LAB — HEMOGLOBIN A1C
Hgb A1c MFr Bld: 7.4 % of total Hgb — ABNORMAL HIGH (ref <5.7)
Mean Plasma Glucose: 166 mg/dL
eAG (mmol/L): 9.2 mmol/L

## 2022-01-09 ENCOUNTER — Ambulatory Visit: Payer: BC Managed Care – PPO | Admitting: Internal Medicine

## 2022-01-09 ENCOUNTER — Ambulatory Visit (INDEPENDENT_AMBULATORY_CARE_PROVIDER_SITE_OTHER): Payer: BC Managed Care – PPO | Admitting: Internal Medicine

## 2022-01-09 ENCOUNTER — Other Ambulatory Visit: Payer: Self-pay

## 2022-01-09 ENCOUNTER — Encounter: Payer: Self-pay | Admitting: Internal Medicine

## 2022-01-09 VITALS — BP 120/74 | HR 69 | Temp 98.3°F | Wt 279.4 lb

## 2022-01-09 DIAGNOSIS — G8929 Other chronic pain: Secondary | ICD-10-CM

## 2022-01-09 DIAGNOSIS — Z9889 Other specified postprocedural states: Secondary | ICD-10-CM

## 2022-01-09 DIAGNOSIS — I10 Essential (primary) hypertension: Secondary | ICD-10-CM

## 2022-01-09 DIAGNOSIS — F411 Generalized anxiety disorder: Secondary | ICD-10-CM | POA: Diagnosis not present

## 2022-01-09 DIAGNOSIS — N401 Enlarged prostate with lower urinary tract symptoms: Secondary | ICD-10-CM

## 2022-01-09 DIAGNOSIS — M25512 Pain in left shoulder: Secondary | ICD-10-CM | POA: Diagnosis not present

## 2022-01-09 DIAGNOSIS — E119 Type 2 diabetes mellitus without complications: Secondary | ICD-10-CM | POA: Diagnosis not present

## 2022-01-09 DIAGNOSIS — R351 Nocturia: Secondary | ICD-10-CM

## 2022-01-09 MED ORDER — METFORMIN HCL 500 MG PO TABS: ORAL_TABLET | ORAL | 0 refills | Status: DC

## 2022-01-09 MED ORDER — ACCU-CHEK AVIVA PLUS VI STRP: ORAL_STRIP | 99 refills | Status: DC

## 2022-01-09 MED ORDER — ACCU-CHEK SOFTCLIX LANCETS MISC: 12 refills | Status: DC

## 2022-01-09 NOTE — Progress Notes (Signed)
   Subjective:    Patient ID: Chad Gardner, male    DOB: [birth year], 56 y.o.   MRN: [id number]  HPI Patient had lumbar surgery with Dr. Jake Samples in early May.  Apparently had lumbar decompression and TLIF L5-S1.  Gnosis was spondylolisthesis of the lumbar spine.  He has not been able to return to work and is on short term diability through his employer, [employer]. He is having more issues with his left shoulder. He is also attending physical therapy for his back.  History of right-sided radiculopathy prior to his surgery.  History of microdiscectomy L2-L3 for lumbar disc herniation with radiculopathy in 2017 by Dr. Venetia Maxon.  History of left shoulder arthroscopy and biceps tenodesis April 2021 by Dr. Madelon Lips.  He had degenerative labral fraying tear left shoulder and shoulder impingement symptoms.  He had biceps tendinitis and bursitis of the shoulder as well as rotator cuff disorder and AC joint arthritis.  He had shoulder arthroscopy with extensive debridement on the left at surgical center in [place] on September 24, 2019.  On looking at operative report in themedia section of Epic, report says he had a partial claviculectomy.  History of glucose intolerance treated with Januvia and glipizide.  Ophthalmologist is Dr. Dione Booze.  History of small right inguinal hernia and small renal cyst bilaterally.  Social history: Separated from his wife.  Does not smoke.  Drinks beer sometimes when watching sports.  Recently not drinking much at all.  He is right-handed.  He has a son who works as an Airline pilot in [place].  Family history: Father with history of prostate cancer, living.  Mother with history of stroke and hypertension.  They reside here in [place] and he tries to take care of them.  He has 2 brothers.  1 brother does well and the other brother has schizophrenia and hypertension.  1 sister overweight.  History of chronic stenosing tenosynovitis repair by Dr. Teressa Senter.  History of hypertension treated  with metoprolol and HCTZ as well as losartan 100 mg daily.  History of BPH.  He tried tamsulosin in 2017 but could not tolerate it.  History of 3 benign polyps removed by Dr. Loreta Ave in 2016 and he is due for repeat colonoscopy in 2026.  He has Type 2 diabetes and that is why he had appt here today.       Review of Systems bilateral shoulder pain for several months     Objective:   Physical Exam   VS are reviewed and are stable.  Chest is clear.  Cardiac exam: Regular rate and rhythm without murmur or ectopy.  No lower extremity pitting edema.  Expresses concern regarding his situation.  Feels that his progress is slower than he had expected.      Assessment & Plan:   Type 2 diabetes mellitus treated with oral agents.  Hemoglobin A1c is 7.4% and was 6.8% in April.  This likely reflects inactivity as he has been at home recovering from lumbar surgery.  Patient is to try to walk some outside to help with glucose control.  I have not changed his medications today.  Bilateral shoulder pain-he will would like to see another orthopedist.  We will make referral to orthopedist who specializes in shoulder surgery.  History of microdiscectomy by Dr. Venetia Maxon L2-L3 in 2017  History of lumbar decompression and TLIF L5-S1 on the right early May 2023 and is currently out of work recovering.  Essential hypertension stable  Plan: Referral to Dr. Rennis Chris at [facility].

## 2022-01-12 NOTE — Therapy (Signed)
OUTPATIENT PHYSICAL THERAPY TREATMENT NOTE   Patient Name: Chad Gardner MRN: [id number] DOB:06-01-1966, 56 y.o., male Today's Date: 01/16/2022  PCP: Margaree Mackintosh, MD   REFERRING PROVIDER: Dawley, Alan Mulder, DO   END OF SESSION:   PT End of Session - 01/16/22 0916     Visit Number 2    Number of Visits 9    Date for PT Re-Evaluation 03/03/22    Authorization Type BCBS    PT Start Time 0912    PT Stop Time 0955    PT Time Calculation (min) 43 min    Activity Tolerance Patient tolerated treatment well    Behavior During Therapy WFL for tasks assessed/performed             Past Medical History:  Diagnosis Date   Arthritis    Diabetes mellitus without complication (HCC)    Hypertension    Overactive bladder    Tendonitis of wrist, left    Past Surgical History:  Procedure Laterality Date   BACK SURGERY     ruptured disc   SHOULDER ARTHROSCOPY Left 09/24/2019   TENDON RELEASE  06/05/2009   Lt wrist   TRANSFORAMINAL LUMBAR INTERBODY FUSION W/ MIS 1 LEVEL Right 10/04/2021   Procedure: MINIMALLY INVASIVE DECOMPRESSION AND TRANSFORAMINAL LUMBAR INTERBODY FUSION LUMBAR FIVE-SACRAL ONE, RIGHT SIDED APPROACH;  Surgeon: Bethann Goo, DO;  Location: [facility] OR;  Service: Neurosurgery;  Laterality: Right;  3C   Patient Active Problem List   Diagnosis Date Noted   Spondylolisthesis, lumbar region 10/04/2021   Anterolisthesis 12/15/2015   Controlled diabetes mellitus type II without complication (HCC) 10/23/2014   Erectile dysfunction 10/19/2012   Type 2 diabetes mellitus (HCC) 05/12/2012   Right inguinal hernia 08/01/2011   Bilateral renal cysts 08/01/2011   OVERWEIGHT 01/22/2009   ESSENTIAL HYPERTENSION, BENIGN 09/11/2006    REFERRING DIAG: Spondylolisthesis of lumbar region  THERAPY DIAG:  Other low back pain  Muscle weakness (generalized)  Other abnormalities of gait and mobility  Rationale for Evaluation and Treatment Rehabilitation  PERTINENT HISTORY: L5-S1  decompression and TLIF 10/04/2021, DM, HTN  PRECAUTIONS: None  SUBJECTIVE: Patient reports he has been doing fair, the first few days he was trying to do the exercises and walking which causes some soreness.   PAIN:  Are you having pain? 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   OBJECTIVE: (objective measures completed at initial evaluation unless otherwise dated) PATIENT SURVEYS:  FOTO 50% functional status   MUSCLE LENGTH: Hamstring and quad/hip flexor flexibility deficit   POSTURE:            Rounded shoulder and forward head,    PALPATION: Tender to palpation lumbar paraspinals   LUMBAR ROM:    Active  AROM  eval  Flexion 25%  Extension 50%  Right lateral flexion 50%  Left lateral flexion 50%  Right rotation 50%  Left rotation 50%                        Patient reports pain mainly in lumbar flexion   LOWER EXTREMITY MMT:     MMT Right eval Left eval Rt / Lt 01/16/2022  Hip flexion 4 4   Hip extension 3- 3-   Hip abduction 3- 3- 3 / 3  Knee flexion  5 5   Knee extension 5 5   Ankle dorsiflexion 5 5   Ankle plantarflexion 3 4     FUNCTIONAL TESTS:  Sit to stand: patient requires UE support to stand, demonstrates stiffness with standing, reports low back pain with pushing to stand   GAIT: Assistive device utilized: None Level of assistance: Complete Independence Comments: Antalgic on right, decreased trunk rotation     TODAY'S TREATMENT  [facility] Adult PT Treatment:                                                DATE: 01/16/2022 Therapeutic Exercise: NuStep L5 x 5 min with UE/LE while taking subjective Seated hamstring stretch 2 x 20 sec each Hooklying SKTC stretch x 20 sec each Piriformis stretch x 20 sec  each LTR 5 x 5 sec Bridge partial range x 10 SLR partial range x 10 each Hooklying clamshell with blue x 20 Sidelying hip abduction 2 x 10 each Sit to stand from elevated table without UE support 2 x 10 Seated physioball rollout stretch 5 x 10 sec hold   [facility] Adult PT Treatment:                                                DATE: 01/06/2022 Therapeutic Exercise: Hooklying SKTC stretch 2 x 20 sec each Piriformis stretch 2 x 20 sec each LTR 10 x 5 sec SLR partial range x 10 each Bridge partial range x 10 Side clamshell with green x 10 each Seated hamstring stretch 2 x 20 sec each     PATIENT EDUCATION:  Education details: HEP Person educated: Patient Education method: Programmer, multimedia, Demonstration, Actor cues, Verbal cues Education comprehension: verbalized understanding, returned demonstration, verbal cues required, tactile cues required, and needs further education   HOME EXERCISE PROGRAM: Access Code: [email or web address]     ASSESSMENT: CLINICAL IMPRESSION: Patient tolerated therapy well with no adverse effects. Therapy focuses on progression of gentle lumbar stretching and mobility, and progress of core and hip strengthening with good tolerance. He did report increased discomfort with therapy but able to complete all prescribed exercises. He continues to exhibit gross strength deficits and difficulty standing from a standard chair, but was able to perform from elevated table without UE support. Cueing required for utilizing hip hinge technique for standing. No changes made to HEP this visit. Patient would benefit from continued PT to progress mobility and strength in order to reduce pain and maximize functional ability.     OBJECTIVE IMPAIRMENTS Abnormal gait, decreased activity tolerance, decreased ROM, decreased strength, impaired flexibility, improper body mechanics, postural dysfunction, and pain.    ACTIVITY LIMITATIONS carrying, lifting, bending, sitting, standing, squatting,  continence, and locomotion level   PARTICIPATION LIMITATIONS: meal prep, cleaning, laundry, shopping, community activity, occupation, and yard work   PERSONAL Land, Past/current experiences, Profession, and Time since onset of injury/illness/exacerbation are also affecting patient's functional outcome.      GOALS: Goals reviewed with patient? Yes   SHORT TERM GOALS: Target date: 02/03/2022   Patient will be I with initial HEP in order to progress with therapy. Baseline: HEP provided at eval Goal status: INITIAL   2.  PT will review FOTO with patient by 3rd visit in  order to understand expected progress and outcome with therapy. Baseline: FOTO assessed at eval Goal status: INITIAL   3.  Patient will report pain level </= 3/10 in order to reduce functional limitations with walking. Baseline: 6/10 pain level Goal status: INITIAL   4. Patient will be able to perform sit to stand using proper technique and without using UE in order to indicate improve LE strength and mobility            Baseline: patient requires UE to perform sit to stand            Goal Status: INITIAL   LONG TERM GOALS: Target date: 03/03/2022   Patient will be I with final HEP to maintain progress from PT. Baseline: HEP provided at eval Goal status: INITIAL   2.  Patient will report >/= 56% status on FOTO to indicate improved functional ability. Baseline: 50% functional status Goal status: INITIAL   3.  Patient will demonstrate core and hip strength >/= 4/5 MMT in order to reduce limitations with lifting so he can return to work without limitations Baseline: core and hip strength grossly 3-/5 MMT Goal status: INITIAL   4.  Patient will demonstrate lumbar flexion >/= 50% without increased pain in order to improve ability to bend or stoop without limitation to return to work Baseline: lumbar flexion grossly 25% with increased pain Goal status: INITIAL     PLAN: PT FREQUENCY: 1-2x/week   PT  DURATION: 8 weeks   PLANNED INTERVENTIONS: Therapeutic exercises, Therapeutic activity, Neuromuscular re-education, Balance training, Gait training, Patient/Family education, Self Care, Joint mobilization, Joint manipulation, Aquatic Therapy, Dry Needling, Electrical stimulation, Spinal manipulation, Spinal mobilization, Cryotherapy, Moist heat, Taping, Manual therapy, and Re-evaluation.   PLAN FOR NEXT SESSION: Review HEP and progress PRN, stretching for lumbar mobility, hamstring and quad/hip flexor stretching, progress core and hip strengthening, lifting mechanics     Rosana Hoes, PT, DPT, LAT, ATC 01/16/22  9:57 AM Phone: [phone] Fax: [phone]

## 2022-01-16 ENCOUNTER — Other Ambulatory Visit: Payer: Self-pay

## 2022-01-16 ENCOUNTER — Ambulatory Visit: Payer: BC Managed Care – PPO | Admitting: Physical Therapy

## 2022-01-16 ENCOUNTER — Ambulatory Visit: Payer: BC Managed Care – PPO | Admitting: Internal Medicine

## 2022-01-16 ENCOUNTER — Encounter: Payer: Self-pay | Admitting: Physical Therapy

## 2022-01-16 DIAGNOSIS — M5459 Other low back pain: Secondary | ICD-10-CM | POA: Diagnosis not present

## 2022-01-16 DIAGNOSIS — R2689 Other abnormalities of gait and mobility: Secondary | ICD-10-CM

## 2022-01-16 DIAGNOSIS — M6281 Muscle weakness (generalized): Secondary | ICD-10-CM

## 2022-01-20 ENCOUNTER — Telehealth: Payer: Self-pay

## 2022-01-20 NOTE — Patient Instructions (Addendum)
Referral to Dr. Rennis Chris at [facility] for evaluation of shoulder issue.  Patient is seen here every 6 months for follow-up on hypertension and type 2 diabetes mellitus.

## 2022-01-20 NOTE — Telephone Encounter (Signed)
Patient called asking about changing his shoulder doctor and scheduling an MRI and a referral that was discussed during his last appt  Please advise

## 2022-01-22 ENCOUNTER — Other Ambulatory Visit: Payer: Self-pay | Admitting: Internal Medicine

## 2022-01-25 ENCOUNTER — Other Ambulatory Visit: Payer: Self-pay

## 2022-01-25 ENCOUNTER — Ambulatory Visit: Payer: BC Managed Care – PPO | Admitting: Physical Therapy

## 2022-01-25 ENCOUNTER — Encounter: Payer: Self-pay | Admitting: Physical Therapy

## 2022-01-25 DIAGNOSIS — M5459 Other low back pain: Secondary | ICD-10-CM

## 2022-01-25 DIAGNOSIS — M6281 Muscle weakness (generalized): Secondary | ICD-10-CM

## 2022-01-25 DIAGNOSIS — R2689 Other abnormalities of gait and mobility: Secondary | ICD-10-CM

## 2022-01-25 NOTE — Patient Instructions (Signed)
Access Code: [email or web address] URL: [email or web address] Date: 01/25/2022 Prepared by: Rosana Hoes  Exercises - Hooklying Single Knee to Chest Stretch  - 2 x daily - 3 reps - 20 seconds hold - Supine Piriformis Stretch with Foot on Ground  - 2 x daily - 3 reps - 20 seconds hold - Supine Lower Trunk Rotation  - 2 x daily - 10 reps - 5 seconds hold - Straight Leg Raise  - 1 x daily - 2 sets - 8-10 reps - Bridge  - 1 x daily - 2 sets - 8-10 reps - Sidelying Hip Abduction  - 1 x daily - 2 sets - 10 reps - Seated Hamstring Stretch  - 2 x daily - 3 reps - 20 seconds hold - Hip Extension with Resistance Loop  - 1 x daily - 2 sets - 10 reps

## 2022-01-25 NOTE — Therapy (Signed)
OUTPATIENT PHYSICAL THERAPY TREATMENT NOTE   Patient Name: Chad Gardner MRN: [id number] DOB:12-03-1965, 56 y.o., male Today's Date: 01/25/2022  PCP: Margaree Mackintosh, MD   REFERRING PROVIDER: Dawley, Alan Mulder, DO   END OF SESSION:   PT End of Session - 01/25/22 1211     Visit Number 3    Number of Visits 9    Date for PT Re-Evaluation 03/03/22    Authorization Type BCBS    PT Start Time 1215    PT Stop Time 1300    PT Time Calculation (min) 45 min    Activity Tolerance Patient tolerated treatment well    Behavior During Therapy WFL for tasks assessed/performed              Past Medical History:  Diagnosis Date   Arthritis    Diabetes mellitus without complication (HCC)    Hypertension    Overactive bladder    Tendonitis of wrist, left    Past Surgical History:  Procedure Laterality Date   BACK SURGERY     ruptured disc   SHOULDER ARTHROSCOPY Left 09/24/2019   TENDON RELEASE  06/05/2009   Lt wrist   TRANSFORAMINAL LUMBAR INTERBODY FUSION W/ MIS 1 LEVEL Right 10/04/2021   Procedure: MINIMALLY INVASIVE DECOMPRESSION AND TRANSFORAMINAL LUMBAR INTERBODY FUSION LUMBAR FIVE-SACRAL ONE, RIGHT SIDED APPROACH;  Surgeon: Bethann Goo, DO;  Location: [facility] OR;  Service: Neurosurgery;  Laterality: Right;  3C   Patient Active Problem List   Diagnosis Date Noted   Spondylolisthesis, lumbar region 10/04/2021   Anterolisthesis 12/15/2015   Controlled diabetes mellitus type II without complication (HCC) 10/23/2014   Erectile dysfunction 10/19/2012   Type 2 diabetes mellitus (HCC) 05/12/2012   Right inguinal hernia 08/01/2011   Bilateral renal cysts 08/01/2011   OVERWEIGHT 01/22/2009   ESSENTIAL HYPERTENSION, BENIGN 09/11/2006    REFERRING DIAG: Spondylolisthesis of lumbar region  THERAPY DIAG:  Other low back pain  Muscle weakness (generalized)  Other abnormalities of gait and mobility  Rationale for Evaluation and Treatment Rehabilitation  PERTINENT HISTORY: L5-S1  decompression and TLIF 10/04/2021, DM, HTN  PRECAUTIONS: None  SUBJECTIVE: Patient reports he had a time this past weekend where he went to stand up from the chair and felt a catch in his back and had to sit back down. He has started taking his pain medication again because of that pain. He reports pain mainly occurs when he goes to stand but once he is standing he alright.  PAIN:  Are you having pain? 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   OBJECTIVE: (objective measures completed at initial evaluation unless otherwise dated) PATIENT SURVEYS:  FOTO 50% functional status   MUSCLE LENGTH: Hamstring and quad/hip flexor flexibility deficit   POSTURE:            Rounded shoulder and forward head    PALPATION: Tender to palpation lumbar paraspinals   LUMBAR ROM:    Active  AROM  eval  Flexion 25%  Extension 50%  Right lateral flexion 50%  Left lateral flexion 50%  Right rotation 50%  Left rotation 50%                        Patient reports pain mainly in lumbar flexion   LOWER EXTREMITY MMT:  MMT Right eval Left eval Rt / Lt 01/16/2022 Rt / Lt 01/25/2022  Hip flexion 4 4    Hip extension 3- 3-  3- / 3-  Hip abduction 3- 3- 3 / 3 3 / 3  Knee flexion 5 5    Knee extension 5 5    Ankle dorsiflexion 5 5    Ankle plantarflexion 3 4      FUNCTIONAL TESTS:  Sit to stand: patient requires UE support to stand, demonstrates stiffness with standing, reports low back pain with pushing to stand   GAIT: Assistive device utilized: None Level of assistance: Complete Independence Comments: Antalgic on right, decreased trunk rotation     TODAY'S TREATMENT  [facility] Adult PT Treatment:                                                DATE:  01/25/2022 Therapeutic Exercise: NuStep L6 x 5 min with UE/LE while taking subjective Seated hamstring stretch 2 x 20 sec each Seated physioball rollout stretch 10 x 5 sec hold LTR 5 x 5 sec Piriformis stretch 2 x 20 sec each Modified thomas stretch  2 x 30 sec each Trial bridge but patient unable to perform this visit due to low back pain Sidelying hip abduction 2 x 10 each SLR 2 x 10 each Standing hip abduction with red at ankles 2 x 10 each Sit to stand from elevated table without UE support 2 x 10   [facility] Adult PT Treatment:                                                DATE: 01/16/2022 Therapeutic Exercise: NuStep L5 x 5 min with UE/LE while taking subjective Seated hamstring stretch 2 x 20 sec each Hooklying SKTC stretch x 20 sec each Piriformis stretch x 20 sec each LTR 5 x 5 sec Bridge partial range x 10 SLR partial range x 10 each Hooklying clamshell with blue x 20 Sidelying hip abduction 2 x 10 each Sit to stand from elevated table without UE support 2 x 10 Seated physioball rollout stretch 5 x 10 sec hold  [facility] Adult PT Treatment:                                                DATE: 01/06/2022 Therapeutic Exercise: Hooklying SKTC stretch 2 x 20 sec each Piriformis stretch 2 x 20 sec each LTR 10 x 5 sec SLR partial range x 10 each Bridge partial range x 10 Side clamshell with green x 10 each Seated hamstring stretch 2 x 20 sec each   PATIENT EDUCATION:  Education details: HEP update Person educated: Patient Education method: Explanation, Demonstration, Tactile cues, Verbal cues, Handout Education comprehension: verbalized understanding, returned demonstration, verbal cues required, tactile cues required, and needs further education   HOME EXERCISE PROGRAM: Access Code: [email or web address]     ASSESSMENT: CLINICAL IMPRESSION: Patient tolerated therapy well with no adverse effects. He had more difficulty performing bridge exercises this visit due to pain so incorporated  standing hip extension for gluteal strengthening with better tolerance. He continues  to exhibit gross strength deficits of the hip and core that is likely contributing to difficulty standing from a chair. Updated HEP to continue hip strengthening at home. Patient would benefit from continued PT to progress mobility and strength in order to reduce pain and maximize functional ability.     OBJECTIVE IMPAIRMENTS Abnormal gait, decreased activity tolerance, decreased ROM, decreased strength, impaired flexibility, improper body mechanics, postural dysfunction, and pain.    ACTIVITY LIMITATIONS carrying, lifting, bending, sitting, standing, squatting, continence, and locomotion level   PARTICIPATION LIMITATIONS: meal prep, cleaning, laundry, shopping, community activity, occupation, and yard work   PERSONAL [place], Past/current experiences, Profession, and Time since onset of injury/illness/exacerbation are also affecting patient's functional outcome.      GOALS: Goals reviewed with patient? Yes   SHORT TERM GOALS: Target date: 02/03/2022   Patient will be I with initial HEP in order to progress with therapy. Baseline: HEP provided at eval Goal status: INITIAL   2.  PT will review FOTO with patient by 3rd visit in order to understand expected progress and outcome with therapy. Baseline: FOTO assessed at eval Goal status: INITIAL   3.  Patient will report pain level </= 3/10 in order to reduce functional limitations with walking. Baseline: 6/10 pain level Goal status: INITIAL   4. Patient will be able to perform sit to stand using proper technique and without using UE in order to indicate improve LE strength and mobility            Baseline: patient requires UE to perform sit to stand            Goal Status: INITIAL   LONG TERM GOALS: Target date: 03/03/2022   Patient will be I with final HEP to maintain progress from PT. Baseline: HEP provided at eval Goal status: INITIAL   2.   Patient will report >/= 56% status on FOTO to indicate improved functional ability. Baseline: 50% functional status Goal status: INITIAL   3.  Patient will demonstrate core and hip strength >/= 4/5 MMT in order to reduce limitations with lifting so he can return to work without limitations Baseline: core and hip strength grossly 3-/5 MMT Goal status: INITIAL   4.  Patient will demonstrate lumbar flexion >/= 50% without increased pain in order to improve ability to bend or stoop without limitation to return to work Baseline: lumbar flexion grossly 25% with increased pain Goal status: INITIAL     PLAN: PT FREQUENCY: 1-2x/week   PT DURATION: 8 weeks   PLANNED INTERVENTIONS: Therapeutic exercises, Therapeutic activity, Neuromuscular re-education, Balance training, Gait training, Patient/Family education, Self Care, Joint mobilization, Joint manipulation, Aquatic Therapy, Dry Needling, Electrical stimulation, Spinal manipulation, Spinal mobilization, Cryotherapy, Moist heat, Taping, Manual therapy, and Re-evaluation.   PLAN FOR NEXT SESSION: Review HEP and progress PRN, stretching for lumbar mobility, hamstring and quad/hip flexor stretching, progress core and hip strengthening, lifting mechanics     Rosana Hoes, PT, DPT, LAT, ATC 01/25/22  1:25 PM Phone: [phone] Fax: [phone]

## 2022-01-30 ENCOUNTER — Other Ambulatory Visit: Payer: Self-pay

## 2022-01-30 MED ORDER — ACCU-CHEK SOFTCLIX LANCETS MISC: 3 refills | Status: DC

## 2022-01-30 MED ORDER — ACCU-CHEK AVIVA PLUS VI STRP: ORAL_STRIP | 3 refills | Status: AC

## 2022-02-01 ENCOUNTER — Encounter: Payer: Self-pay | Admitting: Physical Therapy

## 2022-02-01 ENCOUNTER — Ambulatory Visit: Payer: BC Managed Care – PPO | Admitting: Physical Therapy

## 2022-02-01 ENCOUNTER — Other Ambulatory Visit: Payer: Self-pay

## 2022-02-01 DIAGNOSIS — M6281 Muscle weakness (generalized): Secondary | ICD-10-CM

## 2022-02-01 DIAGNOSIS — M5459 Other low back pain: Secondary | ICD-10-CM

## 2022-02-01 DIAGNOSIS — R2689 Other abnormalities of gait and mobility: Secondary | ICD-10-CM

## 2022-02-01 NOTE — Therapy (Signed)
OUTPATIENT PHYSICAL THERAPY TREATMENT NOTE   Patient Name: Chad Gardner MRN: [id number] DOB:10/03/1965, 56 y.o., male Today's Date: 02/01/2022  PCP: Elby Showers, MD   REFERRING PROVIDER: Dawley, Theodoro Doing, DO   END OF SESSION:   PT End of Session - 02/01/22 1000     Visit Number 4    Number of Visits 9    Date for PT Re-Evaluation 03/03/22    Authorization Type BCBS    PT Start Time 0958    PT Stop Time 1040    PT Time Calculation (min) 42 min    Activity Tolerance Patient tolerated treatment well    Behavior During Therapy WFL for tasks assessed/performed               Past Medical History:  Diagnosis Date   Arthritis    Diabetes mellitus without complication ([place])    Hypertension    Overactive bladder    Tendonitis of wrist, left    Past Surgical History:  Procedure Laterality Date   BACK SURGERY     ruptured disc   SHOULDER ARTHROSCOPY Left 09/24/2019   TENDON RELEASE  06/05/2009   Lt wrist   TRANSFORAMINAL LUMBAR INTERBODY FUSION W/ MIS 1 LEVEL Right 10/04/2021   Procedure: MINIMALLY INVASIVE DECOMPRESSION AND TRANSFORAMINAL LUMBAR INTERBODY FUSION LUMBAR FIVE-SACRAL ONE, RIGHT SIDED APPROACH;  Surgeon: Karsten Ro, DO;  Location: [facility];  Service: Neurosurgery;  Laterality: Right;  3C   Patient Active Problem List   Diagnosis Date Noted   Spondylolisthesis, lumbar region 10/04/2021   Anterolisthesis 12/15/2015   Controlled diabetes mellitus type II without complication ([place]) 17/61/6073   Erectile dysfunction 10/19/2012   Type 2 diabetes mellitus ([place]) 05/12/2012   Right inguinal hernia 08/01/2011   Bilateral renal cysts 08/01/2011   OVERWEIGHT 01/22/2009   ESSENTIAL HYPERTENSION, BENIGN 09/11/2006    REFERRING DIAG: Spondylolisthesis of lumbar region  THERAPY DIAG:  Other low back pain  Muscle weakness (generalized)  Other abnormalities of gait and mobility  Rationale for Evaluation and Treatment Rehabilitation  PERTINENT HISTORY:  L5-S1 decompression and TLIF 10/04/2021, DM, HTN  PRECAUTIONS: None  SUBJECTIVE: Patient reports he has started using his pain medication more this week, he is taking it about every day. Pain occurs in the left lower back area mainly when he is transitioning from sitting to standing. He has been walking up to an hour and 15 minutes.   PAIN:  Are you having pain? 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   OBJECTIVE: (objective measures completed at initial evaluation unless otherwise dated) PATIENT SURVEYS:  FOTO 50% functional status   MUSCLE LENGTH: Hamstring and quad/hip flexor flexibility deficit   POSTURE:            Rounded shoulder and forward head    PALPATION: Tender to palpation lumbar paraspinals   LUMBAR ROM:    Active  AROM  eval  Flexion 25%  Extension 50%  Right lateral flexion 50%  Left lateral flexion 50%  Right rotation 50%  Left rotation 50%                        Patient reports pain mainly in lumbar flexion   LOWER EXTREMITY MMT:     MMT Right eval Left eval Rt /  Lt 01/16/2022 Rt / Lt 01/25/2022 Rt / Lt 02/01/2022  Hip flexion 4 4     Hip extension 3- 3-  3- / 3- 3- / 3-  Hip abduction 3- 3- 3 / 3 3 / 3   Knee flexion 5 5     Knee extension 5 5     Ankle dorsiflexion 5 5     Ankle plantarflexion 3 4       FUNCTIONAL TESTS:  Sit to stand: patient requires UE support to stand, demonstrates stiffness with standing, reports low back pain with pushing to stand   GAIT: Assistive device utilized: None Level of assistance: Complete Independence Comments: Antalgic on right, decreased trunk rotation     TODAY'S TREATMENT  [facility] Adult PT Treatment:                                                DATE:  02/01/2022 Therapeutic Exercise: NuStep L6 x 5 min with UE/LE while taking subjective Prone press up partial range 3 x 10 Prone hip extension 2 x 10 Piriformis stretch 2 x 30 sec each Trial bridge but patient still unable to perform without increased pain SLR 2 x 10 each Sidelying hip abduction 2 x 10 each Standing hip extension with red at knees 3 x 10 each - forward lean on physioball 1st set but patient had difficulty with stability, used bolster last 2 sets Forward 6" step-up 2 x 10 each   [facility] Adult PT Treatment:                                                DATE: 01/25/2022 Therapeutic Exercise: NuStep L6 x 5 min with UE/LE while taking subjective Seated hamstring stretch 2 x 20 sec each Seated physioball rollout stretch 10 x 5 sec hold LTR 5 x 5 sec Piriformis stretch 2 x 20 sec each Modified thomas stretch  2 x 30 sec each Trial bridge but patient unable to perform this visit due to low back pain Sidelying hip abduction 2 x 10 each SLR 2 x 10 each Standing hip abduction with red at ankles 2 x 10 each Sit to stand from elevated table without UE support 2 x 10  [facility] Adult PT Treatment:                                                DATE: 01/16/2022 Therapeutic Exercise: NuStep L5 x 5 min with UE/LE while taking subjective Seated hamstring stretch 2 x 20 sec each Hooklying SKTC stretch x 20 sec each Piriformis stretch x 20 sec each LTR 5 x 5 sec Bridge partial range x 10 SLR partial range x 10 each Hooklying clamshell with blue x 20 Sidelying hip abduction 2 x 10 each Sit to stand from elevated table without UE support 2 x 10 Seated physioball rollout stretch 5 x 10 sec hold   PATIENT EDUCATION:  Education details: HEP Person educated: Patient Education method: Consulting civil engineer, Demonstration, Corporate treasurer cues, Verbal cues Education comprehension: verbalized understanding, returned demonstration, verbal cues required, tactile cues required, and needs further education  HOME  EXERCISE PROGRAM: Access Code: [email or web address]     ASSESSMENT: CLINICAL IMPRESSION: Patient tolerated therapy well with no adverse effects. He continues to have increased pain and limitation with sit to stand and is unable to perform supine bridge indicating continued hip strength deficit. He exhibits difficulty with majority of strengthening exercises, especially hip extension based movements. No changes made to HEP this visit. Patient would benefit from continued PT to progress mobility and strength in order to reduce pain and maximize functional ability.     OBJECTIVE IMPAIRMENTS Abnormal gait, decreased activity tolerance, decreased ROM, decreased strength, impaired flexibility, improper body mechanics, postural dysfunction, and pain.    ACTIVITY LIMITATIONS carrying, lifting, bending, sitting, standing, squatting, continence, and locomotion level   PARTICIPATION LIMITATIONS: meal prep, cleaning, laundry, shopping, community activity, occupation, and yard work   PERSONAL Education officer, community, Past/current experiences, Profession, and Time since onset of injury/illness/exacerbation are also affecting patient's functional outcome.      GOALS: Goals reviewed with patient? Yes   SHORT TERM GOALS: Target date: 02/03/2022   Patient will be I with initial HEP in order to progress with therapy. Baseline: HEP provided at eval 02/01/2022: progressing Goal status: ONGOING   2.  PT will review FOTO with patient by 3rd visit in order to understand expected progress and outcome with therapy. Baseline: FOTO assessed at eval 02/01/2022: reviewed Goal status: MET   3.  Patient will report pain level </= 3/10 in order to reduce functional limitations with walking. Baseline: 6/10 pain level 02/01/2022: 3/10, increased with activity Goal status: ONGOING   4. Patient will be able to perform sit to stand using proper technique and without using UE in order to indicate improve LE strength and mobility             Baseline: patient requires UE to perform sit to stand  02/01/2022: patient still limited with sit to stand            Goal Status: ONGOING   LONG TERM GOALS: Target date: 03/03/2022   Patient will be I with final HEP to maintain progress from PT. Baseline: HEP provided at eval Goal status: INITIAL   2.  Patient will report >/= 56% status on FOTO to indicate improved functional ability. Baseline: 50% functional status Goal status: INITIAL   3.  Patient will demonstrate core and hip strength >/= 4/5 MMT in order to reduce limitations with lifting so he can return to work without limitations Baseline: core and hip strength grossly 3-/5 MMT Goal status: INITIAL   4.  Patient will demonstrate lumbar flexion >/= 50% without increased pain in order to improve ability to bend or stoop without limitation to return to work Baseline: lumbar flexion grossly 25% with increased pain Goal status: INITIAL     PLAN: PT FREQUENCY: 1-2x/week   PT DURATION: 8 weeks   PLANNED INTERVENTIONS: Therapeutic exercises, Therapeutic activity, Neuromuscular re-education, Balance training, Gait training, Patient/Family education, Self Care, Joint mobilization, Joint manipulation, Aquatic Therapy, Dry Needling, Electrical stimulation, Spinal manipulation, Spinal mobilization, Cryotherapy, Moist heat, Taping, Manual therapy, and Re-evaluation.   PLAN FOR NEXT SESSION: Review HEP and progress PRN, stretching for lumbar mobility, hamstring and quad/hip flexor stretching, progress core and hip strengthening especially hip extension, lifting mechanics     Hilda Blades, PT, DPT, LAT, ATC 02/01/22  10:52 AM Phone: [phone] Fax: [phone]

## 2022-02-07 NOTE — Therapy (Signed)
OUTPATIENT PHYSICAL THERAPY TREATMENT NOTE   Patient Name: Chad Gardner MRN: [id number] DOB:Dec 09, 1965, 56 y.o., male Today's Date: 02/08/2022  PCP: Elby Showers, MD   REFERRING PROVIDER: Dawley, Theodoro Doing, DO   END OF SESSION:   PT End of Session - 02/08/22 1008     Visit Number 5    Number of Visits 9    Date for PT Re-Evaluation 03/03/22    Authorization Type BCBS    PT Start Time 1000    PT Stop Time 1045    PT Time Calculation (min) 45 min    Activity Tolerance Patient tolerated treatment well    Behavior During Therapy WFL for tasks assessed/performed                Past Medical History:  Diagnosis Date   Arthritis    Diabetes mellitus without complication ([place])    Hypertension    Overactive bladder    Tendonitis of wrist, left    Past Surgical History:  Procedure Laterality Date   BACK SURGERY     ruptured disc   SHOULDER ARTHROSCOPY Left 09/24/2019   TENDON RELEASE  06/05/2009   Lt wrist   TRANSFORAMINAL LUMBAR INTERBODY FUSION W/ MIS 1 LEVEL Right 10/04/2021   Procedure: MINIMALLY INVASIVE DECOMPRESSION AND TRANSFORAMINAL LUMBAR INTERBODY FUSION LUMBAR FIVE-SACRAL ONE, RIGHT SIDED APPROACH;  Surgeon: Karsten Ro, DO;  Location: [facility];  Service: Neurosurgery;  Laterality: Right;  3C   Patient Active Problem List   Diagnosis Date Noted   Spondylolisthesis, lumbar region 10/04/2021   Anterolisthesis 12/15/2015   Controlled diabetes mellitus type II without complication ([place]) 75/64/3329   Erectile dysfunction 10/19/2012   Type 2 diabetes mellitus ([place]) 05/12/2012   Right inguinal hernia 08/01/2011   Bilateral renal cysts 08/01/2011   OVERWEIGHT 01/22/2009   ESSENTIAL HYPERTENSION, BENIGN 09/11/2006    REFERRING DIAG: Spondylolisthesis of lumbar region  THERAPY DIAG:  Other low back pain  Muscle weakness (generalized)  Other abnormalities of gait and mobility  Rationale for Evaluation and Treatment Rehabilitation  PERTINENT HISTORY:  L5-S1 decompression and TLIF 10/04/2021, DM, HTN  PRECAUTIONS: None  SUBJECTIVE: Patient reports he walked for an hour and a half yesterday. He also did a lot of standing and grilling over the weekend which did make him sore.  PAIN:  Are you having pain? 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   OBJECTIVE: (objective measures completed at initial evaluation unless otherwise dated) PATIENT SURVEYS:  FOTO 50% functional status   MUSCLE LENGTH: Hamstring and quad/hip flexor flexibility deficit   POSTURE:            Rounded shoulder and forward head    PALPATION: Tender to palpation lumbar paraspinals   LUMBAR ROM:    Active  AROM  eval  Flexion 25%  Extension 50%  Right lateral flexion 50%  Left lateral flexion 50%  Right rotation 50%  Left rotation 50%                        Patient reports pain mainly in lumbar flexion   LOWER EXTREMITY MMT:     MMT Right eval Left eval Rt / Lt 01/16/2022 Rt / Lt 01/25/2022 Rt / Lt 02/01/2022 Rt / Lt 02/08/2022  Hip flexion 4 4  Hip extension 3- 3-  3- / 3- 3- / 3- 3- / 3+  Hip abduction 3- 3- 3 / 3 3 / 3    Knee flexion 5 5      Knee extension 5 5      Ankle dorsiflexion 5 5      Ankle plantarflexion 3 4        FUNCTIONAL TESTS:  Sit to stand: patient requires UE support to stand, demonstrates stiffness with standing, reports low back pain with pushing to stand   GAIT: Assistive device utilized: None Level of assistance: Complete Independence Comments: Antalgic on right, decreased trunk rotation     TODAY'S TREATMENT  [facility] Adult PT Treatment:                                                DATE: 02/08/2022 Therapeutic Exercise: NuStep L7 x 5 min with UE/LE while taking  subjective Prone hip extension 2 x 10 each - 2 pillows under lower abdomen Prone hip flexor/quad stretch 2 x 30 sec each Quadruped hip extension 2 x 10 each - forearms supported on bolster Bridge partial range 2 x 5 Standing hip extension with green at knees 3 x 10 each - forward lean forearms supported on bolster, stance leg on Airex pad Standing hip abduction with green at knees 2 x 10 - stance leg on Airex pad Forward 8" step-up with TRX support 2 x 10 each TRX squat 2 x 10   [facility] Adult PT Treatment:                                                DATE: 02/01/2022 Therapeutic Exercise: NuStep L6 x 5 min with UE/LE while taking subjective Prone press up partial range 3 x 10 Prone hip extension 2 x 10 Piriformis stretch 2 x 30 sec each Trial bridge but patient still unable to perform without increased pain SLR 2 x 10 each Sidelying hip abduction 2 x 10 each Standing hip extension with red at knees 3 x 10 each - forward lean on physioball 1st set but patient had difficulty with stability, used bolster last 2 sets Forward 6" step-up 2 x 10 each  [facility] Adult PT Treatment:                                                DATE: 01/25/2022 Therapeutic Exercise: NuStep L6 x 5 min with UE/LE while taking subjective Seated hamstring stretch 2 x 20 sec each Seated physioball rollout stretch 10 x 5 sec hold LTR 5 x 5 sec Piriformis stretch 2 x 20 sec each Modified thomas stretch  2 x 30 sec each Trial bridge but patient unable to perform this visit due to low back pain Sidelying hip abduction 2 x 10 each SLR 2 x 10 each Standing hip abduction with red at ankles 2 x 10 each Sit to stand from elevated table without UE support 2 x 10   PATIENT EDUCATION:  Education details: HEP Person educated: Patient Education method: Explanation, Demonstration, Tactile cues, Verbal cues Education comprehension: verbalized understanding, returned  demonstration, verbal cues required, tactile cues required, and  needs further education   HOME EXERCISE PROGRAM: Access Code: [email or web address]     ASSESSMENT: CLINICAL IMPRESSION: Patient tolerated therapy well with no adverse effects. Therapy focused primarily on progression of hip extension strengthening to improve his ability to stand from a chair. Patient continues to exhibit difficulty standing from and chair and reports right lower back pain. He did report mild increased in right low back pain with exercises and report of muscular fatigue. He exhibits greater difficulty with hip extension on the right with weakness compared to the left. No changes to HEP this visit. Patient would benefit from continued PT to progress mobility and strength in order to reduce pain and maximize functional ability.     OBJECTIVE IMPAIRMENTS Abnormal gait, decreased activity tolerance, decreased ROM, decreased strength, impaired flexibility, improper body mechanics, postural dysfunction, and pain.    ACTIVITY LIMITATIONS carrying, lifting, bending, sitting, standing, squatting, continence, and locomotion level   PARTICIPATION LIMITATIONS: meal prep, cleaning, laundry, shopping, community activity, occupation, and yard work   PERSONAL Education officer, community, Past/current experiences, Profession, and Time since onset of injury/illness/exacerbation are also affecting patient's functional outcome.      GOALS: Goals reviewed with patient? Yes   SHORT TERM GOALS: Target date: 02/03/2022   Patient will be I with initial HEP in order to progress with therapy. Baseline: HEP provided at eval 02/01/2022: progressing Goal status: ONGOING   2.  PT will review FOTO with patient by 3rd visit in order to understand expected progress and outcome with therapy. Baseline: FOTO assessed at eval 02/01/2022: reviewed Goal status: MET   3.  Patient will report pain level </= 3/10 in order to reduce functional limitations with walking. Baseline: 6/10 pain level 02/01/2022: 3/10, increased with  activity Goal status: ONGOING   4. Patient will be able to perform sit to stand using proper technique and without using UE in order to indicate improve LE strength and mobility            Baseline: patient requires UE to perform sit to stand  02/01/2022: patient still limited with sit to stand            Goal Status: ONGOING   LONG TERM GOALS: Target date: 03/03/2022   Patient will be I with final HEP to maintain progress from PT. Baseline: HEP provided at eval Goal status: INITIAL   2.  Patient will report >/= 56% status on FOTO to indicate improved functional ability. Baseline: 50% functional status Goal status: INITIAL   3.  Patient will demonstrate core and hip strength >/= 4/5 MMT in order to reduce limitations with lifting so he can return to work without limitations Baseline: core and hip strength grossly 3-/5 MMT Goal status: INITIAL   4.  Patient will demonstrate lumbar flexion >/= 50% without increased pain in order to improve ability to bend or stoop without limitation to return to work Baseline: lumbar flexion grossly 25% with increased pain Goal status: INITIAL     PLAN: PT FREQUENCY: 1-2x/week   PT DURATION: 8 weeks   PLANNED INTERVENTIONS: Therapeutic exercises, Therapeutic activity, Neuromuscular re-education, Balance training, Gait training, Patient/Family education, Self Care, Joint mobilization, Joint manipulation, Aquatic Therapy, Dry Needling, Electrical stimulation, Spinal manipulation, Spinal mobilization, Cryotherapy, Moist heat, Taping, Manual therapy, and Re-evaluation.   PLAN FOR NEXT SESSION: Review HEP and progress PRN, stretching for lumbar mobility, hamstring and quad/hip flexor stretching, progress core and hip strengthening especially hip extension, lifting mechanics  Hilda Blades, PT, DPT, LAT, ATC 02/08/22  10:56 AM Phone: [phone] Fax: [phone]

## 2022-02-08 ENCOUNTER — Ambulatory Visit: Payer: BC Managed Care – PPO | Attending: Neurological Surgery | Admitting: Physical Therapy

## 2022-02-08 ENCOUNTER — Encounter: Payer: Self-pay | Admitting: Physical Therapy

## 2022-02-08 ENCOUNTER — Other Ambulatory Visit: Payer: Self-pay

## 2022-02-08 DIAGNOSIS — M5459 Other low back pain: Secondary | ICD-10-CM | POA: Diagnosis present

## 2022-02-08 DIAGNOSIS — M6281 Muscle weakness (generalized): Secondary | ICD-10-CM | POA: Insufficient documentation

## 2022-02-08 DIAGNOSIS — R2689 Other abnormalities of gait and mobility: Secondary | ICD-10-CM | POA: Diagnosis present

## 2022-02-14 NOTE — Therapy (Signed)
OUTPATIENT PHYSICAL THERAPY TREATMENT NOTE   Patient Name: Chad Gardner MRN: [id number] DOB:June 24, 1965, 56 y.o., male Today's Date: 02/15/2022  PCP: Elby Showers, MD   REFERRING PROVIDER: Dawley, Theodoro Doing, DO   END OF SESSION:   PT End of Session - 02/15/22 1007     Visit Number 6    Number of Visits 9    Date for PT Re-Evaluation 03/03/22    Authorization Type BCBS    PT Start Time 1000    PT Stop Time 1045    PT Time Calculation (min) 45 min    Activity Tolerance Patient tolerated treatment well    Behavior During Therapy WFL for tasks assessed/performed                 Past Medical History:  Diagnosis Date   Arthritis    Diabetes mellitus without complication ([place])    Hypertension    Overactive bladder    Tendonitis of wrist, left    Past Surgical History:  Procedure Laterality Date   BACK SURGERY     ruptured disc   SHOULDER ARTHROSCOPY Left 09/24/2019   TENDON RELEASE  06/05/2009   Lt wrist   TRANSFORAMINAL LUMBAR INTERBODY FUSION W/ MIS 1 LEVEL Right 10/04/2021   Procedure: MINIMALLY INVASIVE DECOMPRESSION AND TRANSFORAMINAL LUMBAR INTERBODY FUSION LUMBAR FIVE-SACRAL ONE, RIGHT SIDED APPROACH;  Surgeon: Karsten Ro, DO;  Location: [facility];  Service: Neurosurgery;  Laterality: Right;  3C   Patient Active Problem List   Diagnosis Date Noted   Spondylolisthesis, lumbar region 10/04/2021   Anterolisthesis 12/15/2015   Controlled diabetes mellitus type II without complication ([place]) 46/09/7996   Erectile dysfunction 10/19/2012   Type 2 diabetes mellitus ([place]) 05/12/2012   Right inguinal hernia 08/01/2011   Bilateral renal cysts 08/01/2011   OVERWEIGHT 01/22/2009   ESSENTIAL HYPERTENSION, BENIGN 09/11/2006    REFERRING DIAG: Spondylolisthesis of lumbar region  THERAPY DIAG:  Other low back pain  Muscle weakness (generalized)  Other abnormalities of gait and mobility  Rationale for Evaluation and Treatment Rehabilitation  PERTINENT HISTORY:  L5-S1 decompression and TLIF 10/04/2021, DM, HTN  PRECAUTIONS: None  SUBJECTIVE: Patient reports he was sore after last visit. He continues to have a catch in his lower back when standing from a chair.  PAIN:  Are you having pain? 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   OBJECTIVE: (objective measures completed at initial evaluation unless otherwise dated) PATIENT SURVEYS:  FOTO 50% functional status   MUSCLE LENGTH: Hamstring and quad/hip flexor flexibility deficit   POSTURE:            Rounded shoulder and forward head    PALPATION: Tender to palpation lumbar paraspinals   LUMBAR ROM:    Active  AROM  eval  Flexion 25%  Extension 50%  Right lateral flexion 50%  Left lateral flexion 50%  Right rotation 50%  Left rotation 50%                        Patient reports pain mainly in lumbar flexion   LOWER EXTREMITY MMT:     MMT Right eval Left eval Rt / Lt 01/16/2022 Rt / Lt 01/25/2022 Rt / Lt 02/01/2022 Rt / Lt 02/08/2022  Hip flexion 4 4  Hip extension 3- 3-  3- / 3- 3- / 3- 3- / 3+  Hip abduction 3- 3- 3 / 3 3 / 3    Knee flexion 5 5      Knee extension 5 5      Ankle dorsiflexion 5 5      Ankle plantarflexion 3 4        FUNCTIONAL TESTS:  Sit to stand: patient requires UE support to stand, demonstrates stiffness with standing, reports low back pain with pushing to stand   GAIT: Assistive device utilized: None Level of assistance: Complete Independence Comments: Antalgic on right, decreased trunk rotation     TODAY'S TREATMENT  [facility] Adult PT Treatment:                                                DATE: 02/15/2022 Therapeutic Exercise: LTR with feet on physioball x 10 Piriformis stretch 2 x 30 sec  each Sidelying hip abduction 2 x 15 each Prone hip extension 2 x 10 each - 2 pillows under lower abdomen Prone hip flexor/quad stretch x 30 sec each Quadruped hip extension 2 x 10 each Quadruped rock back stretch 5 x 5 sec TRX squat 2 x 10 Sit to stand from standard chair + Airex pad without UE support 2 x 10 Squat with counter support for HEP demo   [facility] Adult PT Treatment:                                                DATE: 02/08/2022 Therapeutic Exercise: NuStep L7 x 5 min with UE/LE while taking subjective Prone hip extension 2 x 10 each - 2 pillows under lower abdomen Prone hip flexor/quad stretch 2 x 30 sec each Quadruped hip extension 2 x 10 each - forearms supported on bolster Bridge partial range 2 x 5 Standing hip extension with green at knees 3 x 10 each - forward lean forearms supported on bolster, stance leg on Airex pad Standing hip abduction with green at knees 2 x 10 - stance leg on Airex pad Forward 8" step-up with TRX support 2 x 10 each TRX squat 2 x 10  [facility] Adult PT Treatment:                                                DATE: 02/01/2022 Therapeutic Exercise: NuStep L6 x 5 min with UE/LE while taking subjective Prone press up partial range 3 x 10 Prone hip extension 2 x 10 Piriformis stretch 2 x 30 sec each Trial bridge but patient still unable to perform without increased pain SLR 2 x 10 each Sidelying hip abduction 2 x 10 each Standing hip extension with red at knees 3 x 10 each - forward lean on physioball 1st set but patient had difficulty with stability, used bolster last 2 sets Forward 6" step-up 2 x 10 each   PATIENT EDUCATION:  Education details: HEP Person educated: Patient Education method: Explanation, Demonstration, Corporate treasurer cues, Verbal cues Education comprehension: verbalized understanding, returned demonstration, verbal cues required, tactile cues required, and needs further education  HOME EXERCISE PROGRAM: Access Code: [email or web address]      ASSESSMENT: CLINICAL IMPRESSION: Patient tolerated therapy well with no adverse effects. Therapy continues to focus on progressing core and hip strength and improving ability to stand from a chair without UE support. He does seem to making progress and was able to perform sit to stand without UE support from chair with Airex pad. He continues to report difficulty with turning and rolling in bed due and stand from standard chair due to catching in his lower back. Updated HEP to progress squatting and strengthening at home Patient would benefit from continued PT to progress mobility and strength in order to reduce pain and maximize functional ability.     OBJECTIVE IMPAIRMENTS Abnormal gait, decreased activity tolerance, decreased ROM, decreased strength, impaired flexibility, improper body mechanics, postural dysfunction, and pain.    ACTIVITY LIMITATIONS carrying, lifting, bending, sitting, standing, squatting, continence, and locomotion level   PARTICIPATION LIMITATIONS: meal prep, cleaning, laundry, shopping, community activity, occupation, and yard work   PERSONAL Education officer, community, Past/current experiences, Profession, and Time since onset of injury/illness/exacerbation are also affecting patient's functional outcome.      GOALS: Goals reviewed with patient? Yes   SHORT TERM GOALS: Target date: 02/03/2022   Patient will be I with initial HEP in order to progress with therapy. Baseline: HEP provided at eval 02/01/2022: progressing Goal status: ONGOING   2.  PT will review FOTO with patient by 3rd visit in order to understand expected progress and outcome with therapy. Baseline: FOTO assessed at eval 02/01/2022: reviewed Goal status: MET   3.  Patient will report pain level </= 3/10 in order to reduce functional limitations with walking. Baseline: 6/10 pain level 02/01/2022: 3/10, increased with activity Goal status: ONGOING   4. Patient will be able to perform sit to stand using  proper technique and without using UE in order to indicate improve LE strength and mobility            Baseline: patient requires UE to perform sit to stand  02/01/2022: patient still limited with sit to stand            Goal Status: ONGOING   LONG TERM GOALS: Target date: 03/03/2022   Patient will be I with final HEP to maintain progress from PT. Baseline: HEP provided at eval Goal status: INITIAL   2.  Patient will report >/= 56% status on FOTO to indicate improved functional ability. Baseline: 50% functional status Goal status: INITIAL   3.  Patient will demonstrate core and hip strength >/= 4/5 MMT in order to reduce limitations with lifting so he can return to work without limitations Baseline: core and hip strength grossly 3-/5 MMT Goal status: INITIAL   4.  Patient will demonstrate lumbar flexion >/= 50% without increased pain in order to improve ability to bend or stoop without limitation to return to work Baseline: lumbar flexion grossly 25% with increased pain Goal status: INITIAL     PLAN: PT FREQUENCY: 1-2x/week   PT DURATION: 8 weeks   PLANNED INTERVENTIONS: Therapeutic exercises, Therapeutic activity, Neuromuscular re-education, Balance training, Gait training, Patient/Family education, Self Care, Joint mobilization, Joint manipulation, Aquatic Therapy, Dry Needling, Electrical stimulation, Spinal manipulation, Spinal mobilization, Cryotherapy, Moist heat, Taping, Manual therapy, and Re-evaluation.   PLAN FOR NEXT SESSION: Review HEP and progress PRN, stretching for lumbar mobility, hamstring and quad/hip flexor stretching, progress core and hip strengthening especially hip extension, lifting mechanics     Hilda Blades, PT, DPT, LAT, ATC  02/15/22  10:56 AM Phone: [phone] Fax: [phone]

## 2022-02-15 ENCOUNTER — Ambulatory Visit: Payer: BC Managed Care – PPO | Admitting: Physical Therapy

## 2022-02-15 ENCOUNTER — Other Ambulatory Visit: Payer: Self-pay

## 2022-02-15 ENCOUNTER — Encounter: Payer: Self-pay | Admitting: Physical Therapy

## 2022-02-15 DIAGNOSIS — M5459 Other low back pain: Secondary | ICD-10-CM | POA: Diagnosis not present

## 2022-02-15 DIAGNOSIS — M6281 Muscle weakness (generalized): Secondary | ICD-10-CM

## 2022-02-15 DIAGNOSIS — R2689 Other abnormalities of gait and mobility: Secondary | ICD-10-CM

## 2022-02-15 NOTE — Patient Instructions (Signed)
Access Code: [email or web address] URL: [email or web address] Date: 02/15/2022 Prepared by: Rosana Hoes  Exercises - Hooklying Single Knee to Chest Stretch  - 2 x daily - 3 reps - 20 seconds hold - Supine Piriformis Stretch with Foot on Ground  - 2 x daily - 3 reps - 20 seconds hold - Supine Lower Trunk Rotation  - 2 x daily - 10 reps - 5 seconds hold - Straight Leg Raise  - 1 x daily - 2 sets - 8-10 reps - Bridge  - 1 x daily - 2 sets - 8-10 reps - Sidelying Hip Abduction  - 1 x daily - 2 sets - 10 reps - Seated Hamstring Stretch  - 2 x daily - 3 reps - 20 seconds hold - Hip Extension with Resistance Loop  - 1 x daily - 2 sets - 10 reps - Squat with Counter Support  - 1 x daily - 3 sets - 10 reps

## 2022-02-17 ENCOUNTER — Encounter: Payer: BC Managed Care – PPO | Admitting: Physical Therapy

## 2022-02-20 NOTE — Therapy (Signed)
OUTPATIENT PHYSICAL THERAPY TREATMENT NOTE   Patient Name: Chad Gardner MRN: [id number] DOB:1966-05-06, 56 y.o., male Today's Date: 02/22/2022  PCP: Elby Showers, MD   REFERRING PROVIDER: Dawley, Theodoro Doing, DO   END OF SESSION:   PT End of Session - 02/22/22 1002     Visit Number 7    Number of Visits 9    Date for PT Re-Evaluation 03/03/22    Authorization Type BCBS    PT Start Time 1000    PT Stop Time 1045    PT Time Calculation (min) 45 min    Activity Tolerance Patient tolerated treatment well    Behavior During Therapy WFL for tasks assessed/performed                  Past Medical History:  Diagnosis Date   Arthritis    Diabetes mellitus without complication ([place])    Hypertension    Overactive bladder    Tendonitis of wrist, left    Past Surgical History:  Procedure Laterality Date   BACK SURGERY     ruptured disc   SHOULDER ARTHROSCOPY Left 09/24/2019   TENDON RELEASE  06/05/2009   Lt wrist   TRANSFORAMINAL LUMBAR INTERBODY FUSION W/ MIS 1 LEVEL Right 10/04/2021   Procedure: MINIMALLY INVASIVE DECOMPRESSION AND TRANSFORAMINAL LUMBAR INTERBODY FUSION LUMBAR FIVE-SACRAL ONE, RIGHT SIDED APPROACH;  Surgeon: Karsten Ro, DO;  Location: [facility];  Service: Neurosurgery;  Laterality: Right;  3C   Patient Active Problem List   Diagnosis Date Noted   Spondylolisthesis, lumbar region 10/04/2021   Anterolisthesis 12/15/2015   Controlled diabetes mellitus type II without complication ([place]) 14/48/1856   Erectile dysfunction 10/19/2012   Type 2 diabetes mellitus ([place]) 05/12/2012   Right inguinal hernia 08/01/2011   Bilateral renal cysts 08/01/2011   OVERWEIGHT 01/22/2009   ESSENTIAL HYPERTENSION, BENIGN 09/11/2006    REFERRING DIAG: Spondylolisthesis of lumbar region  THERAPY DIAG:  Other low back pain  Muscle weakness (generalized)  Other abnormalities of gait and mobility  Rationale for Evaluation and Treatment Rehabilitation  PERTINENT  HISTORY: L5-S1 decompression and TLIF 10/04/2021, DM, HTN  PRECAUTIONS: None  SUBJECTIVE: Patient reports his thighs were very sore for a few days after last visit. He has been walking more and trimmed some bushes yesterday, going up and down a ladder. He has noticed that   PAIN:  Are you having pain? 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   OBJECTIVE: (objective measures completed at initial evaluation unless otherwise dated) PATIENT SURVEYS:  FOTO 50% functional status  02/22/2022: 57% functional status   MUSCLE LENGTH: Hamstring and quad/hip flexor flexibility deficit   POSTURE:            Rounded shoulder and forward head    PALPATION: Tender to palpation lumbar paraspinals   LUMBAR ROM:    Active  AROM  eval  Flexion 25%  Extension 50%  Right lateral flexion 50%  Left lateral flexion 50%  Right rotation 50%  Left rotation 50%                        Patient reports pain mainly in lumbar flexion   LOWER EXTREMITY MMT:     MMT Right eval Left eval Rt / Lt 01/16/2022 Rt / Aundra Millet  01/25/2022 Rt / Lt 02/01/2022 Rt / Lt 02/08/2022 Rt / Lt 02/22/2022  Hip flexion 4 4       Hip extension 3- 3-  3- / 3- 3- / 3- 3- / 3+ 3 / 3+  Hip abduction 3- 3- 3 / 3 3 / 3     Knee flexion 5 5       Knee extension 5 5       Ankle dorsiflexion 5 5       Ankle plantarflexion 3 4         FUNCTIONAL TESTS:  Sit to stand: patient requires UE support to stand, demonstrates stiffness with standing, reports low back pain with pushing to stand   GAIT: Assistive device utilized: None Level of assistance: Complete Independence Comments: Antalgic on right, decreased trunk rotation     TODAY'S TREATMENT  [facility] Adult PT Treatment:                                                 DATE: 02/22/2022 Therapeutic Exercise: NuStep L7 x 5 min with UE/LE while taking subjective Seated hamstring stretch 2 x 30 sec each LTR 5 x 5 sec each Piriformis stretch 2 x 30 sec each Windshield wipers 5 x 5 sec each Modified thomas stretch 3 x 30 sec with passive knee flexion Standing lunge hip flexor stretch with foot in chair 5 x 10 sec each Standing lumbar extensions at counter x 10 Quadruped hip extension with forearms on bolster 2 x 10 each Quadruped rock back stretch 5 x 5 sec TRX squat 2 x 10   [facility] Adult PT Treatment:                                                DATE: 02/15/2022 Therapeutic Exercise: LTR with feet on physioball x 10 Piriformis stretch 2 x 30 sec each Sidelying hip abduction 2 x 15 each Prone hip extension 2 x 10 each - 2 pillows under lower abdomen Prone hip flexor/quad stretch x 30 sec each Quadruped hip extension 2 x 10 each Quadruped rock back stretch 5 x 5 sec TRX squat 2 x 10 Sit to stand from standard chair + Airex pad without UE support 2 x 10 Squat with counter support for HEP demo  [facility] Adult PT Treatment:                                                DATE: 02/08/2022 Therapeutic Exercise: NuStep L7 x 5 min with UE/LE while taking subjective Prone hip extension 2 x 10 each - 2 pillows under lower abdomen Prone hip flexor/quad stretch 2 x 30 sec each Quadruped hip extension 2 x 10 each - forearms supported on bolster Bridge partial range 2 x 5 Standing hip extension with green at knees 3 x 10 each - forward lean forearms supported on bolster, stance leg on Airex pad Standing hip abduction with green at knees 2 x 10 - stance leg on Airex pad Forward 8" step-up with TRX support 2 x 10 each TRX squat 2 x 10  PATIENT EDUCATION:  Education details: HEP, FOTO Person educated: Patient Education method: Explanation, Demonstration, Tactile cues, Verbal cues Education comprehension: verbalized understanding, returned  demonstration, verbal cues required, tactile cues required, and needs further education   HOME EXERCISE PROGRAM: Access Code: [email or web address]     ASSESSMENT: CLINICAL IMPRESSION: Patient tolerated therapy well with no adverse effects. Patient continues to report right sided low back pain with activities such as turning in bed or standing from a chair. He does report an improvement in his functional ability on FOTO, achieving his LTG. Therapy continues to focus on improve lumbar and hip mobility and core and gluteal strengthening. No changes made to HEP this visit. Patient would benefit from continued PT to progress mobility and strength in order to reduce pain and maximize functional ability.     OBJECTIVE IMPAIRMENTS Abnormal gait, decreased activity tolerance, decreased ROM, decreased strength, impaired flexibility, improper body mechanics, postural dysfunction, and pain.    ACTIVITY LIMITATIONS carrying, lifting, bending, sitting, standing, squatting, continence, and locomotion level   PARTICIPATION LIMITATIONS: meal prep, cleaning, laundry, shopping, community activity, occupation, and yard work   PERSONAL Education officer, community, Past/current experiences, Profession, and Time since onset of injury/illness/exacerbation are also affecting patient's functional outcome.      GOALS: Goals reviewed with patient? Yes   SHORT TERM GOALS: Target date: 02/03/2022   Patient will be I with initial HEP in order to progress with therapy. Baseline: HEP provided at eval 02/01/2022: progressing Goal status: ONGOING   2.  PT will review FOTO with patient by 3rd visit in order to understand expected progress and outcome with therapy. Baseline: FOTO assessed at eval 02/01/2022: reviewed Goal status: MET   3.  Patient will report pain level </= 3/10 in order to reduce functional limitations with walking. Baseline: 6/10 pain level 02/01/2022: 3/10, increased with activity Goal status: ONGOING   4. Patient will be  able to perform sit to stand using proper technique and without using UE in order to indicate improve LE strength and mobility            Baseline: patient requires UE to perform sit to stand  02/01/2022: patient still limited with sit to stand            Goal Status: ONGOING   LONG TERM GOALS: Target date: 03/03/2022   Patient will be I with final HEP to maintain progress from PT. Baseline: HEP provided at eval Goal status: INITIAL   2.  Patient will report >/= 56% status on FOTO to indicate improved functional ability. Baseline: 50% functional status 02/22/2022: 57% functional status Goal status: MET   3.  Patient will demonstrate core and hip strength >/= 4/5 MMT in order to reduce limitations with lifting so he can return to work without limitations Baseline: core and hip strength grossly 3-/5 MMT Goal status: INITIAL   4.  Patient will demonstrate lumbar flexion >/= 50% without increased pain in order to improve ability to bend or stoop without limitation to return to work Baseline: lumbar flexion grossly 25% with increased pain Goal status: INITIAL     PLAN: PT FREQUENCY: 1-2x/week   PT DURATION: 8 weeks   PLANNED INTERVENTIONS: Therapeutic exercises, Therapeutic activity, Neuromuscular re-education, Balance training, Gait training, Patient/Family education, Self Care, Joint mobilization, Joint manipulation, Aquatic Therapy, Dry Needling, Electrical stimulation, Spinal manipulation, Spinal mobilization, Cryotherapy, Moist heat, Taping, Manual therapy, and Re-evaluation.   PLAN FOR NEXT SESSION: Review HEP and progress PRN, stretching for lumbar mobility, hamstring and quad/hip flexor  stretching, progress core and hip strengthening especially hip extension, lifting mechanics     Hilda Blades, PT, DPT, LAT, ATC 02/22/22  11:03 AM Phone: [phone] Fax: [phone]

## 2022-02-21 ENCOUNTER — Other Ambulatory Visit: Payer: BC Managed Care – PPO

## 2022-02-21 DIAGNOSIS — E119 Type 2 diabetes mellitus without complications: Secondary | ICD-10-CM

## 2022-02-22 ENCOUNTER — Encounter: Payer: Self-pay | Admitting: Physical Therapy

## 2022-02-22 ENCOUNTER — Ambulatory Visit: Payer: BC Managed Care – PPO | Admitting: Physical Therapy

## 2022-02-22 ENCOUNTER — Other Ambulatory Visit: Payer: Self-pay

## 2022-02-22 DIAGNOSIS — M5459 Other low back pain: Secondary | ICD-10-CM | POA: Diagnosis not present

## 2022-02-22 DIAGNOSIS — M6281 Muscle weakness (generalized): Secondary | ICD-10-CM

## 2022-02-22 DIAGNOSIS — R2689 Other abnormalities of gait and mobility: Secondary | ICD-10-CM

## 2022-02-22 LAB — HEMOGLOBIN A1C
Hgb A1c MFr Bld: 6.8 % of total Hgb — ABNORMAL HIGH (ref <5.7)
Mean Plasma Glucose: 148 mg/dL
eAG (mmol/L): 8.2 mmol/L

## 2022-02-24 ENCOUNTER — Encounter: Payer: BC Managed Care – PPO | Admitting: Physical Therapy

## 2022-02-27 ENCOUNTER — Encounter: Payer: BC Managed Care – PPO | Admitting: Physical Therapy

## 2022-03-01 ENCOUNTER — Ambulatory Visit: Payer: BC Managed Care – PPO | Admitting: Physical Therapy

## 2022-03-01 ENCOUNTER — Other Ambulatory Visit: Payer: Self-pay

## 2022-03-01 ENCOUNTER — Encounter: Payer: Self-pay | Admitting: Physical Therapy

## 2022-03-01 DIAGNOSIS — M5459 Other low back pain: Secondary | ICD-10-CM | POA: Diagnosis not present

## 2022-03-01 DIAGNOSIS — R2689 Other abnormalities of gait and mobility: Secondary | ICD-10-CM

## 2022-03-01 DIAGNOSIS — M6281 Muscle weakness (generalized): Secondary | ICD-10-CM

## 2022-03-01 NOTE — Therapy (Signed)
OUTPATIENT PHYSICAL THERAPY TREATMENT NOTE   Patient Name: Chad Gardner MRN: [id number] DOB:01/15/1966, 56 y.o., male Today's Date: 03/01/2022  PCP: Elby Showers, MD   REFERRING PROVIDER: Dawley, Theodoro Doing, DO   END OF SESSION:   PT End of Session - 03/01/22 1004     Visit Number 8    Number of Visits 14    Date for PT Re-Evaluation 04/12/22    Authorization Type BCBS    PT Start Time 1000    PT Stop Time 1045    PT Time Calculation (min) 45 min    Activity Tolerance Patient tolerated treatment well    Behavior During Therapy WFL for tasks assessed/performed                   Past Medical History:  Diagnosis Date   Arthritis    Diabetes mellitus without complication ([place])    Hypertension    Overactive bladder    Tendonitis of wrist, left    Past Surgical History:  Procedure Laterality Date   BACK SURGERY     ruptured disc   SHOULDER ARTHROSCOPY Left 09/24/2019   TENDON RELEASE  06/05/2009   Lt wrist   TRANSFORAMINAL LUMBAR INTERBODY FUSION W/ MIS 1 LEVEL Right 10/04/2021   Procedure: MINIMALLY INVASIVE DECOMPRESSION AND TRANSFORAMINAL LUMBAR INTERBODY FUSION LUMBAR FIVE-SACRAL ONE, RIGHT SIDED APPROACH;  Surgeon: Karsten Ro, DO;  Location: [facility];  Service: Neurosurgery;  Laterality: Right;  3C   Patient Active Problem List   Diagnosis Date Noted   Spondylolisthesis, lumbar region 10/04/2021   Anterolisthesis 12/15/2015   Controlled diabetes mellitus type II without complication ([place]) 64/15/8309   Erectile dysfunction 10/19/2012   Type 2 diabetes mellitus ([place]) 05/12/2012   Right inguinal hernia 08/01/2011   Bilateral renal cysts 08/01/2011   OVERWEIGHT 01/22/2009   ESSENTIAL HYPERTENSION, BENIGN 09/11/2006    REFERRING DIAG: Spondylolisthesis of lumbar region  THERAPY DIAG:  Other low back pain  Muscle weakness (generalized)  Other abnormalities of gait and mobility  Rationale for Evaluation and Treatment Rehabilitation  PERTINENT  HISTORY: L5-S1 decompression and TLIF 10/04/2021, DM, HTN  PRECAUTIONS: None   SUBJECTIVE: Patient reports he started using a heating pad which has helped a little bit.   PAIN:  Are you having pain? 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   OBJECTIVE: (objective measures completed at initial evaluation unless otherwise dated) PATIENT SURVEYS:  FOTO 50% functional status  02/22/2022: 57% functional status   MUSCLE LENGTH: Hamstring and quad/hip flexor flexibility deficit   POSTURE:            Rounded shoulder and forward head    PALPATION: Tender to palpation lumbar paraspinals   LUMBAR ROM:    Active  AROM  eval  03/01/2022  Flexion 25% 25%  Extension 50% 50%  Right lateral flexion 50% 50%  Left lateral flexion 50% 50%  Right rotation 50% 50%  Left rotation 50% 50%                        Patient reports pain mainly in lumbar flexion   LOWER EXTREMITY MMT:     MMT Right eval Left eval Rt / Lt 01/16/2022 Rt / Lt 01/25/2022 Rt / Lt 02/01/2022 Rt / Lt 02/08/2022 Rt /  Lt 02/22/2022 Rt / Lt 03/01/2022  Hip flexion 4 4        Hip extension 3- 3-  3- / 3- 3- / 3- 3- / 3+ 3 / 3+ 3 / 3+  Hip abduction 3- 3- 3 / 3 3 / 3    3+ / 3+  Knee flexion 5 5        Knee extension 5 5        Ankle dorsiflexion 5 5        Ankle plantarflexion 3 4          FUNCTIONAL TESTS:  Sit to stand: patient requires UE support to stand, demonstrates stiffness with standing, reports low back pain with pushing to stand  03/01/2022: Patient remains limited with sit to stand, requires UE to assist   GAIT: Assistive device utilized: None Level of assistance: Complete Independence Comments: Antalgic on right, decreased trunk rotation     TODAY'S TREATMENT   [facility] Adult PT Treatment:                                                DATE: 03/01/2022 Therapeutic Exercise: NuStep L7 x 5 min with UE/LE while taking subjective Seated hamstring stretch 3 x 30 sec each LTR 5 x 5 sec each Piriformis stretch 3 x 30 sec each Windshield wipers 5 x 5 sec each Standing lunge hip flexor stretch with foot in chair 5 x 10 sec each Seated lumbar flexion physioball rollout 10 x 5 sec Clamshell with green 2 x 15 Quadruped hip extension with forearms on bolster 2 x 10 each Quadruped rock back stretch 5 x 5 sec   [facility] Adult PT Treatment:                                                DATE: 02/22/2022 Therapeutic Exercise: NuStep L7 x 5 min with UE/LE while taking subjective Seated hamstring stretch 2 x 30 sec each LTR 5 x 5 sec each Piriformis stretch 2 x 30 sec each Windshield wipers 5 x 5 sec each Modified thomas stretch 3 x 30 sec with passive knee flexion Standing lunge hip flexor stretch with foot in chair 5 x 10 sec each Standing lumbar extensions at counter x 10 Quadruped hip extension with forearms on bolster 2 x 10 each Quadruped rock back stretch 5 x 5 sec TRX squat 2 x 10  [facility] Adult PT Treatment:                                                DATE: 02/15/2022 Therapeutic Exercise: LTR with feet on physioball x 10 Piriformis stretch 2 x 30 sec each Sidelying hip abduction 2 x 15 each Prone hip extension 2 x 10 each - 2 pillows under lower abdomen Prone hip flexor/quad stretch x 30 sec each Quadruped hip extension 2 x 10 each Quadruped rock back stretch 5 x 5 sec TRX squat 2 x 10 Sit to stand from standard chair + Airex pad without UE support 2 x 10 Squat with counter support for [employer]  PATIENT EDUCATION:  Education details: POC extension, HEP Person educated: Patient Education method: Explanation, Demonstration, Tactile cues, Verbal cues Education comprehension: verbalized understanding, returned demonstration, verbal cues required,  tactile cues required, and needs further education   HOME EXERCISE PROGRAM: Access Code: [email or web address]     ASSESSMENT: CLINICAL IMPRESSION: Patient tolerated therapy well with no adverse effects. He continues to demonstrate limitation in lumbar mobility in all directions, poor gluteal strength, and continued difficulty and pain perform activities such as bridge, sit to stand, or bed mobility due to right sided low back pain. Therapy continues to focus on progressing lumbar mobility and core and hip strength as tolerated. He has achieved his FOTO goal regarding functional ability, but has not achieved goals pertaining to mobility, strength, or pain level with activities. No changes made to HEP this visit. Patient would benefit from continued PT to progress mobility and strength in order to reduce pain and maximize functional ability so will extend PT POC for 6 more weeks at frequency of 1x/week..     OBJECTIVE IMPAIRMENTS Abnormal gait, decreased activity tolerance, decreased ROM, decreased strength, impaired flexibility, improper body mechanics, postural dysfunction, and pain.    ACTIVITY LIMITATIONS carrying, lifting, bending, sitting, standing, squatting, continence, and locomotion level   PARTICIPATION LIMITATIONS: meal prep, cleaning, laundry, shopping, community activity, occupation, and yard work   PERSONAL Education officer, community, Past/current experiences, Profession, and Time since onset of injury/illness/exacerbation are also affecting patient's functional outcome.      GOALS: Goals reviewed with patient? Yes   SHORT TERM GOALS: Target date: 03/29/2022   Patient will be I with initial HEP in order to progress with therapy. Baseline: HEP provided at eval 02/01/2022: progressing 03/01/2022: independent with initial HEP Goal status: MET   2.  PT will review FOTO with patient by 3rd visit in order to understand expected progress and outcome with therapy. Baseline: FOTO assessed at  eval 02/01/2022: reviewed Goal status: MET   3.  Patient will report pain level </= 3/10 in order to reduce functional limitations with walking. Baseline: 6/10 pain level 02/01/2022: patient continues to report 3/10 pain at rest that increases with activity Goal status: ONGOING   4. Patient will be able to perform sit to stand using proper technique and without using UE in order to indicate improve LE strength and mobility            Baseline: patient requires UE to perform sit to stand 02/01/2022: patient still limited with sit to stand  03/01/2022: patient still limited with sit to stand            Goal Status: ONGOING   LONG TERM GOALS: Target date: 04/12/2022   Patient will be I with final HEP to maintain progress from PT. Baseline: HEP provided at eval 03/01/2022: progressing toward final HEP Goal status: ONGOING   2.  Patient will report >/= 56% status on FOTO to indicate improved functional ability. Baseline: 50% functional status 02/22/2022: 57% functional status Goal status: MET   3.  Patient will demonstrate core and hip strength >/= 4/5 MMT in order to reduce limitations with lifting so he can return to work without limitations Baseline: core and hip strength grossly 3-/5 MMT 03/01/2022: patient continues to exhibit gross hip strength deficit Goal status: ONGOING   4.  Patient will demonstrate lumbar flexion >/= 50% without increased pain in order to improve ability to bend or stoop without limitation to return to work Baseline: lumbar flexion grossly 25% with increased pain 03/01/2022: patient  continues to exhibit lumbar mobility deficits Goal status: ONGOING     PLAN: PT FREQUENCY: 1x/week   PT DURATION: 6 weeks   PLANNED INTERVENTIONS: Therapeutic exercises, Therapeutic activity, Neuromuscular re-education, Balance training, Gait training, Patient/Family education, Self Care, Joint mobilization, Joint manipulation, Aquatic Therapy, Dry Needling, Electrical stimulation,  Spinal manipulation, Spinal mobilization, Cryotherapy, Moist heat, Taping, Manual therapy, and Re-evaluation.   PLAN FOR NEXT SESSION: Review HEP and progress PRN, stretching for lumbar mobility, hamstring and quad/hip flexor stretching, progress core and hip strengthening especially hip extension, lifting mechanics     Hilda Blades, PT, DPT, LAT, ATC 03/01/22  10:48 AM Phone: [phone] Fax: [phone]

## 2022-03-07 NOTE — Therapy (Signed)
OUTPATIENT PHYSICAL THERAPY TREATMENT NOTE   Patient Name: Chad Gardner MRN: [id number] DOB:[birth year], 56 y.o., male Today's Date: 03/08/2022  PCP: Elby Showers, MD   REFERRING PROVIDER: Dawley, Theodoro Doing, DO   END OF SESSION:   PT End of Session - 03/08/22 1135     Visit Number 9    Number of Visits 14    Date for PT Re-Evaluation 04/12/22    Authorization Type BCBS    PT Start Time 1130    PT Stop Time 1215    PT Time Calculation (min) 45 min    Activity Tolerance Patient tolerated treatment well    Behavior During Therapy WFL for tasks assessed/performed                    Past Medical History:  Diagnosis Date   Arthritis    Diabetes mellitus without complication ([place])    Hypertension    Overactive bladder    Tendonitis of wrist, left    Past Surgical History:  Procedure Laterality Date   BACK SURGERY     ruptured disc   SHOULDER ARTHROSCOPY Left 09/24/2019   TENDON RELEASE  06/05/2009   Lt wrist   TRANSFORAMINAL LUMBAR INTERBODY FUSION W/ MIS 1 LEVEL Right 10/04/2021   Procedure: MINIMALLY INVASIVE DECOMPRESSION AND TRANSFORAMINAL LUMBAR INTERBODY FUSION LUMBAR FIVE-SACRAL ONE, RIGHT SIDED APPROACH;  Surgeon: Karsten Ro, DO;  Location: [facility];  Service: Neurosurgery;  Laterality: Right;  3C   Patient Active Problem List   Diagnosis Date Noted   Spondylolisthesis, lumbar region 10/04/2021   Anterolisthesis 12/15/2015   Controlled diabetes mellitus type II without complication ([place]) 78/29/5621   Erectile dysfunction 10/19/2012   Type 2 diabetes mellitus ([place]) 05/12/2012   Right inguinal hernia 08/01/2011   Bilateral renal cysts 08/01/2011   OVERWEIGHT 01/22/2009   ESSENTIAL HYPERTENSION, BENIGN 09/11/2006    REFERRING DIAG: Spondylolisthesis of lumbar region  THERAPY DIAG:  Other low back pain  Muscle weakness (generalized)  Other abnormalities of gait and mobility  Rationale for Evaluation and Treatment Rehabilitation  PERTINENT  HISTORY: L5-S1 decompression and TLIF 10/04/2021, DM, HTN  PRECAUTIONS: None   SUBJECTIVE: Patient reports he is doing about the same. He still has the catch in his right lower back with certain movements and activities.  PAIN:  Are you having pain? 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   OBJECTIVE: (objective measures completed at initial evaluation unless otherwise dated) PATIENT SURVEYS:  FOTO 50% functional status  02/22/2022: 57% functional status   MUSCLE LENGTH: Hamstring and quad/hip flexor flexibility deficit   POSTURE:            Rounded shoulder and forward head    PALPATION: Tender to palpation lumbar paraspinals   LUMBAR ROM:    Active  AROM  eval  03/01/2022  Flexion 25% 25%  Extension 50% 50%  Right lateral flexion 50% 50%  Left lateral flexion 50% 50%  Right rotation 50% 50%  Left rotation 50% 50%                        Patient reports pain mainly in lumbar flexion   LOWER EXTREMITY MMT:     MMT Right eval Left eval Rt / Lt 01/16/2022 Rt / Aundra Millet 01/25/2022  Rt / Lt 02/01/2022 Rt / Lt 02/08/2022 Rt / Lt 02/22/2022 Rt / Lt 03/01/2022  Hip flexion 4 4        Hip extension 3- 3-  3- / 3- 3- / 3- 3- / 3+ 3 / 3+ 3 / 3+  Hip abduction 3- 3- 3 / 3 3 / 3    3+ / 3+  Knee flexion 5 5        Knee extension 5 5        Ankle dorsiflexion 5 5        Ankle plantarflexion 3 4          FUNCTIONAL TESTS:  Sit to stand: patient requires UE support to stand, demonstrates stiffness with standing, reports low back pain with pushing to stand  03/01/2022: Patient remains limited with sit to stand, requires UE to assist   GAIT: Assistive device utilized: None Level of assistance: Complete Independence Comments: Antalgic on right,  decreased trunk rotation     TODAY'S TREATMENT  [facility] Adult PT Treatment:                                                DATE: 03/08/2022 Therapeutic Exercise: NuStep L7 x 5 min with UE/LE while taking subjective Leg press (cybex) 60# x 15, 80# 2 x 15 Hip extension machine (cybex) 30# 2 x 10 each Hip abduction machine (cybex) 25# 2 x 10 each Pallof press with red power band x 15 each Forward 8" step-up 2 x 10 each LTR x 10 each   [facility] Adult PT Treatment:                                                DATE: 03/01/2022 Therapeutic Exercise: NuStep L7 x 5 min with UE/LE while taking subjective Seated hamstring stretch 3 x 30 sec each LTR 5 x 5 sec each Piriformis stretch 3 x 30 sec each Windshield wipers 5 x 5 sec each Standing lunge hip flexor stretch with foot in chair 5 x 10 sec each Seated lumbar flexion physioball rollout 10 x 5 sec Clamshell with green 2 x 15 Quadruped hip extension with forearms on bolster 2 x 10 each Quadruped rock back stretch 5 x 5 sec  [facility] Adult PT Treatment:                                                DATE: 02/22/2022 Therapeutic Exercise: NuStep L7 x 5 min with UE/LE while taking subjective Seated hamstring stretch 2 x 30 sec each LTR 5 x 5 sec each Piriformis stretch 2 x 30 sec each Windshield wipers 5 x 5 sec each Modified thomas stretch 3 x 30 sec with passive knee flexion Standing lunge hip flexor stretch with foot in chair 5 x 10 sec each Standing lumbar extensions at counter x 10 Quadruped hip extension with forearms on bolster 2 x 10 each Quadruped rock back stretch 5 x 5 sec TRX squat 2 x 10   PATIENT EDUCATION:  Education details: HEP Person educated: Patient Education method: Explanation, Media planner, Corporate treasurer cues, Verbal  cues Education comprehension: verbalized understanding, returned demonstration, verbal cues required, tactile cues required, and needs further education   HOME EXERCISE PROGRAM: Access Code: [email or web address]      ASSESSMENT: CLINICAL IMPRESSION: Patient tolerated therapy well with no adverse effects. Therapy focused more on strengthening this visit. Patient continues to demonstrate limitation in sit to stand and bed mobility due to pain in his right lower back. No changes made to HEP this visit. Patient would benefit from continued PT to progress mobility and strength in order to reduce pain and maximize functional ability.     OBJECTIVE IMPAIRMENTS Abnormal gait, decreased activity tolerance, decreased ROM, decreased strength, impaired flexibility, improper body mechanics, postural dysfunction, and pain.    ACTIVITY LIMITATIONS carrying, lifting, bending, sitting, standing, squatting, continence, and locomotion level   PARTICIPATION LIMITATIONS: meal prep, cleaning, laundry, shopping, community activity, occupation, and yard work   PERSONAL Education officer, community, Past/current experiences, Profession, and Time since onset of injury/illness/exacerbation are also affecting patient's functional outcome.      GOALS: Goals reviewed with patient? Yes   SHORT TERM GOALS: Target date: 03/29/2022   Patient will be I with initial HEP in order to progress with therapy. Baseline: HEP provided at eval 02/01/2022: progressing 03/01/2022: independent with initial HEP Goal status: MET   2.  PT will review FOTO with patient by 3rd visit in order to understand expected progress and outcome with therapy. Baseline: FOTO assessed at eval 02/01/2022: reviewed Goal status: MET   3.  Patient will report pain level </= 3/10 in order to reduce functional limitations with walking. Baseline: 6/10 pain level 02/01/2022: patient continues to report 3/10 pain at rest that increases with activity Goal status: ONGOING   4. Patient will be able to perform sit to stand using proper technique and without using UE in order to indicate improve LE strength and mobility            Baseline: patient requires UE to perform sit to  stand 02/01/2022: patient still limited with sit to stand  03/01/2022: patient still limited with sit to stand            Goal Status: ONGOING   LONG TERM GOALS: Target date: 04/12/2022   Patient will be I with final HEP to maintain progress from PT. Baseline: HEP provided at eval 03/01/2022: progressing toward final HEP Goal status: ONGOING   2.  Patient will report >/= 56% status on FOTO to indicate improved functional ability. Baseline: 50% functional status 02/22/2022: 57% functional status Goal status: MET   3.  Patient will demonstrate core and hip strength >/= 4/5 MMT in order to reduce limitations with lifting so he can return to work without limitations Baseline: core and hip strength grossly 3-/5 MMT 03/01/2022: patient continues to exhibit gross hip strength deficit Goal status: ONGOING   4.  Patient will demonstrate lumbar flexion >/= 50% without increased pain in order to improve ability to bend or stoop without limitation to return to work Baseline: lumbar flexion grossly 25% with increased pain 03/01/2022: patient continues to exhibit lumbar mobility deficits Goal status: ONGOING     PLAN: PT FREQUENCY: 1x/week   PT DURATION: 6 weeks   PLANNED INTERVENTIONS: Therapeutic exercises, Therapeutic activity, Neuromuscular re-education, Balance training, Gait training, Patient/Family education, Self Care, Joint mobilization, Joint manipulation, Aquatic Therapy, Dry Needling, Electrical stimulation, Spinal manipulation, Spinal mobilization, Cryotherapy, Moist heat, Taping, Manual therapy, and Re-evaluation.   PLAN FOR NEXT SESSION: Review HEP and progress PRN, stretching for lumbar mobility, hamstring  and quad/hip flexor stretching, progress core and hip strengthening especially hip extension, lifting mechanics     Hilda Blades, PT, DPT, LAT, ATC 03/08/22  12:20 PM Phone: [phone] Fax: [phone]

## 2022-03-08 ENCOUNTER — Ambulatory Visit: Payer: BC Managed Care – PPO | Attending: Neurological Surgery | Admitting: Physical Therapy

## 2022-03-08 ENCOUNTER — Encounter: Payer: Self-pay | Admitting: Physical Therapy

## 2022-03-08 ENCOUNTER — Other Ambulatory Visit: Payer: Self-pay

## 2022-03-08 DIAGNOSIS — R2689 Other abnormalities of gait and mobility: Secondary | ICD-10-CM | POA: Diagnosis present

## 2022-03-08 DIAGNOSIS — M5459 Other low back pain: Secondary | ICD-10-CM | POA: Diagnosis not present

## 2022-03-08 DIAGNOSIS — M6281 Muscle weakness (generalized): Secondary | ICD-10-CM | POA: Diagnosis present

## 2022-03-14 ENCOUNTER — Other Ambulatory Visit: Payer: Self-pay | Admitting: Internal Medicine

## 2022-03-14 NOTE — Therapy (Signed)
OUTPATIENT PHYSICAL THERAPY TREATMENT NOTE   Patient Name: Chad Gardner MRN: [id number] DOB:Dec 26, 1965, 56 y.o., male Today's Date: 03/15/2022  PCP: Elby Showers, MD   REFERRING PROVIDER: Dawley, Theodoro Doing, DO   END OF SESSION:   PT End of Session - 03/15/22 1217     Visit Number 10    Number of Visits 14    Date for PT Re-Evaluation 04/12/22    Authorization Type BCBS    PT Start Time 1216    PT Stop Time 1258    PT Time Calculation (min) 42 min    Activity Tolerance Patient tolerated treatment well    Behavior During Therapy WFL for tasks assessed/performed                     Past Medical History:  Diagnosis Date   Arthritis    Diabetes mellitus without complication ([place])    Hypertension    Overactive bladder    Tendonitis of wrist, left    Past Surgical History:  Procedure Laterality Date   BACK SURGERY     ruptured disc   SHOULDER ARTHROSCOPY Left 09/24/2019   TENDON RELEASE  06/05/2009   Lt wrist   TRANSFORAMINAL LUMBAR INTERBODY FUSION W/ MIS 1 LEVEL Right 10/04/2021   Procedure: MINIMALLY INVASIVE DECOMPRESSION AND TRANSFORAMINAL LUMBAR INTERBODY FUSION LUMBAR FIVE-SACRAL ONE, RIGHT SIDED APPROACH;  Surgeon: Karsten Ro, DO;  Location: [facility];  Service: Neurosurgery;  Laterality: Right;  3C   Patient Active Problem List   Diagnosis Date Noted   Spondylolisthesis, lumbar region 10/04/2021   Anterolisthesis 12/15/2015   Controlled diabetes mellitus type II without complication ([place]) 67/89/3810   Erectile dysfunction 10/19/2012   Type 2 diabetes mellitus ([place]) 05/12/2012   Right inguinal hernia 08/01/2011   Bilateral renal cysts 08/01/2011   OVERWEIGHT 01/22/2009   ESSENTIAL HYPERTENSION, BENIGN 09/11/2006    REFERRING DIAG: Spondylolisthesis of lumbar region  THERAPY DIAG:  Other low back pain  Muscle weakness (generalized)  Other abnormalities of gait and mobility  Rationale for Evaluation and Treatment  Rehabilitation  PERTINENT HISTORY: L5-S1 decompression and TLIF 10/04/2021, DM, HTN  PRECAUTIONS: None   SUBJECTIVE: Patient reports he overdid it this past weekend and was sore for a few days. Currently not doing too bad, he has been still trying to do his walking.   PAIN:  Are you having pain? 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   OBJECTIVE: (objective measures completed at initial evaluation unless otherwise dated) PATIENT SURVEYS:  FOTO 50% functional status  02/22/2022: 57% functional status   MUSCLE LENGTH: Hamstring and quad/hip flexor flexibility deficit   POSTURE:            Rounded shoulder and forward head    PALPATION: Tender to palpation lumbar paraspinals   LUMBAR ROM:    Active  AROM  eval  03/01/2022  Flexion 25% 25%  Extension 50% 50%  Right lateral flexion 50% 50%  Left lateral flexion 50% 50%  Right rotation 50% 50%  Left rotation 50% 50%                        Patient reports pain mainly in lumbar flexion   LOWER EXTREMITY MMT:     MMT Right eval Left eval  Rt / Lt 01/16/2022 Rt / Lt 01/25/2022 Rt / Lt 02/01/2022 Rt / Lt 02/08/2022 Rt / Lt 02/22/2022 Rt / Lt 03/01/2022  Hip flexion 4 4        Hip extension 3- 3-  3- / 3- 3- / 3- 3- / 3+ 3 / 3+ 3 / 3+  Hip abduction 3- 3- 3 / 3 3 / 3    3+ / 3+  Knee flexion 5 5        Knee extension 5 5        Ankle dorsiflexion 5 5        Ankle plantarflexion 3 4          FUNCTIONAL TESTS:  Sit to stand: patient requires UE support to stand, demonstrates stiffness with standing, reports low back pain with pushing to stand  03/01/2022: Patient remains limited with sit to stand, requires UE to assist  03/15/2022: patient able to perform sit to stand without using  hands   GAIT: Assistive device utilized: None Level of assistance: Complete Independence Comments: Antalgic on right, decreased trunk rotation     TODAY'S TREATMENT  [facility] Adult PT Treatment:                                                DATE: 03/15/2022 Therapeutic Exercise: NuStep L7 x 5 min with UE/LE while taking subjective Leg press (cybex) 80# x 15, 100# 2 x 15 Hip abduction machine (cybex) 30# 2 x 15 each Forward 10" step-up 2 x 15 each Deadlift 25# from 8" box 2 x 15 Sit to stand without UE support x 10   [facility] Adult PT Treatment:                                                DATE: 03/08/2022 Therapeutic Exercise: NuStep L7 x 5 min with UE/LE while taking subjective Leg press (cybex) 60# x 15, 80# 2 x 15 Hip extension machine (cybex) 30# 2 x 10 each Hip abduction machine (cybex) 25# 2 x 10 each Pallof press with red power band x 15 each Forward 8" step-up 2 x 10 each LTR x 10 each  [facility] Adult PT Treatment:                                                DATE: 03/01/2022 Therapeutic Exercise: NuStep L7 x 5 min with UE/LE while taking subjective Seated hamstring stretch 3 x 30 sec each LTR 5 x 5 sec each Piriformis stretch 3 x 30 sec each Windshield wipers 5 x 5 sec each Standing lunge hip flexor stretch with foot in chair 5 x 10 sec each Seated lumbar flexion physioball rollout 10 x 5 sec Clamshell with green 2 x 15 Quadruped hip extension with forearms on bolster 2 x 10 each Quadruped rock back stretch 5 x 5 sec  PATIENT EDUCATION:  Education details: HEP Person educated: Patient Education method: Consulting civil engineer, Media planner, Corporate treasurer cues, Verbal cues Education comprehension: verbalized understanding, returned demonstration, verbal cues required, tactile cues required, and needs further education   HOME EXERCISE PROGRAM: Access  Code: [email or web address]     ASSESSMENT: CLINICAL IMPRESSION: Patient tolerated therapy well with no adverse effects. Therapy focused primarily  on strengthening for hips and core. He was able to increase weight and resistance with exercises, and initiated lifting with good tolerance. He did require cueing for lifting technique using hip hinge technique and maintaining neutral lumbar spine. He was able to demonstrate ability to perform sit to stand without UE support indicating improved strength. No changes to HEP this visit. Patient would benefit from continued PT to progress mobility and strength in order to reduce pain and maximize functional ability.     OBJECTIVE IMPAIRMENTS Abnormal gait, decreased activity tolerance, decreased ROM, decreased strength, impaired flexibility, improper body mechanics, postural dysfunction, and pain.    ACTIVITY LIMITATIONS carrying, lifting, bending, sitting, standing, squatting, continence, and locomotion level   PARTICIPATION LIMITATIONS: meal prep, cleaning, laundry, shopping, community activity, occupation, and yard work   PERSONAL Education officer, community, Past/current experiences, Profession, and Time since onset of injury/illness/exacerbation are also affecting patient's functional outcome.      GOALS: Goals reviewed with patient? Yes   SHORT TERM GOALS: Target date: 03/29/2022   Patient will be I with initial HEP in order to progress with therapy. Baseline: HEP provided at eval 02/01/2022: progressing 03/01/2022: independent with initial HEP Goal status: MET   2.  PT will review FOTO with patient by 3rd visit in order to understand expected progress and outcome with therapy. Baseline: FOTO assessed at eval 02/01/2022: reviewed Goal status: MET   3.  Patient will report pain level </= 3/10 in order to reduce functional limitations with walking. Baseline: 6/10 pain level 02/01/2022: patient continues to report 3/10 pain at rest that increases with activity Goal status: ONGOING   4. Patient will be able to perform sit to stand using proper technique and without using UE in order to indicate  improve LE strength and mobility            Baseline: patient requires UE to perform sit to stand 02/01/2022: patient still limited with sit to stand  03/01/2022: patient still limited with sit to stand  03/15/2022: able to perform sit to stand without hands            Goal Status: MET   LONG TERM GOALS: Target date: 04/12/2022   Patient will be I with final HEP to maintain progress from PT. Baseline: HEP provided at eval 03/01/2022: progressing toward final HEP Goal status: ONGOING   2.  Patient will report >/= 56% status on FOTO to indicate improved functional ability. Baseline: 50% functional status 02/22/2022: 57% functional status Goal status: MET   3.  Patient will demonstrate core and hip strength >/= 4/5 MMT in order to reduce limitations with lifting so he can return to work without limitations Baseline: core and hip strength grossly 3-/5 MMT 03/01/2022: patient continues to exhibit gross hip strength deficit Goal status: ONGOING   4.  Patient will demonstrate lumbar flexion >/= 50% without increased pain in order to improve ability to bend or stoop without limitation to return to work Baseline: lumbar flexion grossly 25% with increased pain 03/01/2022: patient continues to exhibit lumbar mobility deficits Goal status: ONGOING     PLAN: PT FREQUENCY: 1x/week   PT DURATION: 6 weeks   PLANNED INTERVENTIONS: Therapeutic exercises, Therapeutic activity, Neuromuscular re-education, Balance training, Gait training, Patient/Family education, Self Care, Joint mobilization, Joint manipulation, Aquatic Therapy, Dry Needling, Electrical stimulation, Spinal manipulation, Spinal mobilization, Cryotherapy, Moist heat, Taping, Manual therapy, and  Re-evaluation.   PLAN FOR NEXT SESSION: Review HEP and progress PRN, stretching for lumbar mobility, hamstring and quad/hip flexor stretching, progress core and hip strengthening especially hip extension, lifting mechanics     Hilda Blades, PT,  DPT, LAT, ATC 03/15/22  1:11 PM Phone: [phone] Fax: [phone]

## 2022-03-15 ENCOUNTER — Ambulatory Visit: Payer: BC Managed Care – PPO | Admitting: Physical Therapy

## 2022-03-15 ENCOUNTER — Encounter: Payer: Self-pay | Admitting: Physical Therapy

## 2022-03-15 ENCOUNTER — Other Ambulatory Visit: Payer: Self-pay

## 2022-03-15 DIAGNOSIS — M5459 Other low back pain: Secondary | ICD-10-CM | POA: Diagnosis not present

## 2022-03-15 DIAGNOSIS — M6281 Muscle weakness (generalized): Secondary | ICD-10-CM

## 2022-03-15 DIAGNOSIS — R2689 Other abnormalities of gait and mobility: Secondary | ICD-10-CM

## 2022-03-19 ENCOUNTER — Other Ambulatory Visit: Payer: Self-pay | Admitting: Internal Medicine

## 2022-03-22 ENCOUNTER — Ambulatory Visit: Payer: BC Managed Care – PPO | Admitting: Physical Therapy

## 2022-03-23 NOTE — Therapy (Signed)
OUTPATIENT PHYSICAL THERAPY TREATMENT NOTE   Patient Name: Chad Gardner MRN: [id number] DOB:March 10, 1966, 56 y.o., male Today's Date: 03/24/2022  PCP: Elby Showers, MD   REFERRING PROVIDER: Dawley, Theodoro Doing, DO   END OF SESSION:   PT End of Session - 03/24/22 1005     Visit Number 11    Number of Visits 14    Date for PT Re-Evaluation 04/12/22    Authorization Type BCBS    PT Start Time 1000    PT Stop Time 1045    PT Time Calculation (min) 45 min    Activity Tolerance Patient tolerated treatment well    Behavior During Therapy WFL for tasks assessed/performed                  Past Medical History:  Diagnosis Date   Arthritis    Diabetes mellitus without complication ([place])    Hypertension    Overactive bladder    Tendonitis of wrist, left    Past Surgical History:  Procedure Laterality Date   BACK SURGERY     ruptured disc   SHOULDER ARTHROSCOPY Left 09/24/2019   TENDON RELEASE  06/05/2009   Lt wrist   TRANSFORAMINAL LUMBAR INTERBODY FUSION W/ MIS 1 LEVEL Right 10/04/2021   Procedure: MINIMALLY INVASIVE DECOMPRESSION AND TRANSFORAMINAL LUMBAR INTERBODY FUSION LUMBAR FIVE-SACRAL ONE, RIGHT SIDED APPROACH;  Surgeon: Karsten Ro, DO;  Location: [place];  Service: Neurosurgery;  Laterality: Right;  3C   Patient Active Problem List   Diagnosis Date Noted   Spondylolisthesis, lumbar region 10/04/2021   Anterolisthesis 12/15/2015   Controlled diabetes mellitus type II without complication ([place]) 16/38/4665   Erectile dysfunction 10/19/2012   Type 2 diabetes mellitus ([place]) 05/12/2012   Right inguinal hernia 08/01/2011   Bilateral renal cysts 08/01/2011   OVERWEIGHT 01/22/2009   ESSENTIAL HYPERTENSION, BENIGN 09/11/2006    REFERRING DIAG: Spondylolisthesis of lumbar region  THERAPY DIAG:  Other low back pain  Muscle weakness (generalized)  Other abnormalities of gait and mobility  Rationale for Evaluation and Treatment Rehabilitation  PERTINENT  HISTORY: L5-S1 decompression and TLIF 10/04/2021, DM, HTN  PRECAUTIONS: None   SUBJECTIVE: Patient reports he feels like he hasn't had the catch in his back as much, his neck is bothering him more now.  PAIN:  Are you having pain? 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   OBJECTIVE: (objective measures completed at initial evaluation unless otherwise dated) PATIENT SURVEYS:  FOTO 50% functional status  02/22/2022: 57% functional status   MUSCLE LENGTH: Hamstring and quad/hip flexor flexibility deficit   POSTURE:            Rounded shoulder and forward head    PALPATION: Tender to palpation lumbar paraspinals   LUMBAR ROM:    Active  AROM  eval  03/01/2022  Flexion 25% 25%  Extension 50% 50%  Right lateral flexion 50% 50%  Left lateral flexion 50% 50%  Right rotation 50% 50%  Left rotation 50% 50%                        Patient reports pain mainly in lumbar flexion   LOWER EXTREMITY MMT:     MMT Right eval Left eval Rt / Lt 01/16/2022 Rt / Lt 01/25/2022 Rt / Lt  02/01/2022 Rt / Lt 02/08/2022 Rt / Lt 02/22/2022 Rt / Lt 03/01/2022  Hip flexion 4 4        Hip extension 3- 3-  3- / 3- 3- / 3- 3- / 3+ 3 / 3+ 3 / 3+  Hip abduction 3- 3- 3 / 3 3 / 3    3+ / 3+  Knee flexion 5 5        Knee extension 5 5        Ankle dorsiflexion 5 5        Ankle plantarflexion 3 4          FUNCTIONAL TESTS:  Sit to stand: patient requires UE support to stand, demonstrates stiffness with standing, reports low back pain with pushing to stand  03/01/2022: Patient remains limited with sit to stand, requires UE to assist  03/15/2022: patient able to perform sit to stand without using hands   GAIT: Assistive device utilized: None Level of assistance:  Complete Independence Comments: Antalgic on right, decreased trunk rotation  03/24/2022: grossly WFL     TODAY'S TREATMENT  [facility] Adult PT Treatment:                                                DATE: 03/24/2022 Therapeutic Exercise: NuStep L7 x 5 min with UE/LE while taking subjective Leg press (cybex) 100# x 15, 120# 3 x 10 Dead lift 30# from 8" box 3 x 10 TRX squat x 10 Sit to stand without UE support 2 x 10 Bridge partial range 2 x 10 Hooklying abdominal set 2 x 5 with 5 sec hold Seated lumbar flexion physioball roll out 5 x 10 sec   [facility] Adult PT Treatment:                                                DATE: 03/15/2022 Therapeutic Exercise: NuStep L7 x 5 min with UE/LE while taking subjective Leg press (cybex) 80# x 15, 100# 2 x 15 Hip abduction machine (cybex) 30# 2 x 15 each Forward 10" step-up 2 x 15 each Deadlift 25# from 8" box 2 x 15 Sit to stand without UE support x 10  [facility] Adult PT Treatment:                                                DATE: 03/08/2022 Therapeutic Exercise: NuStep L7 x 5 min with UE/LE while taking subjective Leg press (cybex) 60# x 15, 80# 2 x 15 Hip extension machine (cybex) 30# 2 x 10 each Hip abduction machine (cybex) 25# 2 x 10 each Pallof press with red power band x 15 each Forward 8" step-up 2 x 10 each LTR x 10 each  PATIENT EDUCATION:  Education details: HEP Person educated: Patient Education method: Consulting civil engineer, Demonstration, Tactile cues, Verbal cues Education comprehension: verbalized understanding, returned demonstration, verbal cues required, tactile cues required, and needs further education   HOME EXERCISE PROGRAM: Access Code: [email or web address]     ASSESSMENT: CLINICAL IMPRESSION: Patient tolerated therapy well with no adverse effects. He seems to be progressing better with core  and hip strengthening, demonstrating ability to perform partial range bridge and progressing weight with lifting. Therapy continues to focus on  progressing his core and hip strengthening with good tolerance. He is also walking much better and seems to be reporting less of the catching sensation in the lower back. No changes to HEP made this visit. Patient would benefit from continued PT to progress mobility and strength in order to reduce pain and maximize functional ability.     OBJECTIVE IMPAIRMENTS Abnormal gait, decreased activity tolerance, decreased ROM, decreased strength, impaired flexibility, improper body mechanics, postural dysfunction, and pain.    ACTIVITY LIMITATIONS carrying, lifting, bending, sitting, standing, squatting, continence, and locomotion level   PARTICIPATION LIMITATIONS: meal prep, cleaning, laundry, shopping, community activity, occupation, and yard work   PERSONAL Education officer, community, Past/current experiences, Profession, and Time since onset of injury/illness/exacerbation are also affecting patient's functional outcome.      GOALS: Goals reviewed with patient? Yes   SHORT TERM GOALS: Target date: 03/29/2022   Patient will be I with initial HEP in order to progress with therapy. Baseline: HEP provided at eval 02/01/2022: progressing 03/01/2022: independent with initial HEP Goal status: MET   2.  PT will review FOTO with patient by 3rd visit in order to understand expected progress and outcome with therapy. Baseline: FOTO assessed at eval 02/01/2022: reviewed Goal status: MET   3.  Patient will report pain level </= 3/10 in order to reduce functional limitations with walking. Baseline: 6/10 pain level 02/01/2022: patient continues to report 3/10 pain at rest that increases with activity Goal status: ONGOING   4. Patient will be able to perform sit to stand using proper technique and without using UE in order to indicate improve LE strength and mobility            Baseline: patient requires UE to perform sit to stand 02/01/2022: patient still limited with sit to stand  03/01/2022: patient still limited  with sit to stand  03/15/2022: able to perform sit to stand without hands            Goal Status: MET   LONG TERM GOALS: Target date: 04/12/2022   Patient will be I with final HEP to maintain progress from PT. Baseline: HEP provided at eval 03/01/2022: progressing toward final HEP Goal status: ONGOING   2.  Patient will report >/= 56% status on FOTO to indicate improved functional ability. Baseline: 50% functional status 02/22/2022: 57% functional status Goal status: MET   3.  Patient will demonstrate core and hip strength >/= 4/5 MMT in order to reduce limitations with lifting so he can return to work without limitations Baseline: core and hip strength grossly 3-/5 MMT 03/01/2022: patient continues to exhibit gross hip strength deficit Goal status: ONGOING   4.  Patient will demonstrate lumbar flexion >/= 50% without increased pain in order to improve ability to bend or stoop without limitation to return to work Baseline: lumbar flexion grossly 25% with increased pain 03/01/2022: patient continues to exhibit lumbar mobility deficits Goal status: ONGOING     PLAN: PT FREQUENCY: 1x/week   PT DURATION: 6 weeks   PLANNED INTERVENTIONS: Therapeutic exercises, Therapeutic activity, Neuromuscular re-education, Balance training, Gait training, Patient/Family education, Self Care, Joint mobilization, Joint manipulation, Aquatic Therapy, Dry Needling, Electrical stimulation, Spinal manipulation, Spinal mobilization, Cryotherapy, Moist heat, Taping, Manual therapy, and Re-evaluation.   PLAN FOR NEXT SESSION: Review HEP and progress PRN, stretching for lumbar mobility, hamstring and quad/hip flexor stretching, progress core and hip  strengthening especially hip extension, lifting mechanics     Hilda Blades, PT, DPT, LAT, ATC 03/24/22  10:46 AM Phone: [phone] Fax: [phone]

## 2022-03-24 ENCOUNTER — Other Ambulatory Visit: Payer: Self-pay

## 2022-03-24 ENCOUNTER — Encounter: Payer: Self-pay | Admitting: Physical Therapy

## 2022-03-24 ENCOUNTER — Ambulatory Visit: Payer: BC Managed Care – PPO | Admitting: Physical Therapy

## 2022-03-24 DIAGNOSIS — M5459 Other low back pain: Secondary | ICD-10-CM | POA: Diagnosis not present

## 2022-03-24 DIAGNOSIS — M6281 Muscle weakness (generalized): Secondary | ICD-10-CM

## 2022-03-24 DIAGNOSIS — R2689 Other abnormalities of gait and mobility: Secondary | ICD-10-CM

## 2022-03-25 ENCOUNTER — Other Ambulatory Visit: Payer: Self-pay | Admitting: Internal Medicine

## 2022-03-27 ENCOUNTER — Other Ambulatory Visit: Payer: Self-pay | Admitting: Neurological Surgery

## 2022-03-27 DIAGNOSIS — M4722 Other spondylosis with radiculopathy, cervical region: Secondary | ICD-10-CM

## 2022-03-29 ENCOUNTER — Encounter: Payer: Self-pay | Admitting: Physical Therapy

## 2022-03-29 ENCOUNTER — Other Ambulatory Visit: Payer: Self-pay

## 2022-03-29 ENCOUNTER — Ambulatory Visit: Payer: BC Managed Care – PPO | Admitting: Physical Therapy

## 2022-03-29 DIAGNOSIS — M5459 Other low back pain: Secondary | ICD-10-CM | POA: Diagnosis not present

## 2022-03-29 DIAGNOSIS — R2689 Other abnormalities of gait and mobility: Secondary | ICD-10-CM

## 2022-03-29 DIAGNOSIS — M6281 Muscle weakness (generalized): Secondary | ICD-10-CM

## 2022-03-29 NOTE — Therapy (Signed)
OUTPATIENT PHYSICAL THERAPY TREATMENT NOTE   Patient Name: Chad Gardner MRN: [id number] DOB:05-06-1966, 56 y.o., male Today's Date: 03/29/2022  PCP: Elby Showers, MD   REFERRING PROVIDER: Dawley, Theodoro Doing, DO   END OF SESSION:   PT End of Session - 03/29/22 1012     Visit Number 12    Number of Visits 14    Date for PT Re-Evaluation 04/12/22    Authorization Type BCBS    PT Start Time 1000    PT Stop Time 1045    PT Time Calculation (min) 45 min    Activity Tolerance Patient tolerated treatment well    Behavior During Therapy WFL for tasks assessed/performed                   Past Medical History:  Diagnosis Date   Arthritis    Diabetes mellitus without complication ([place])    Hypertension    Overactive bladder    Tendonitis of wrist, left    Past Surgical History:  Procedure Laterality Date   BACK SURGERY     ruptured disc   SHOULDER ARTHROSCOPY Left 09/24/2019   TENDON RELEASE  06/05/2009   Lt wrist   TRANSFORAMINAL LUMBAR INTERBODY FUSION W/ MIS 1 LEVEL Right 10/04/2021   Procedure: MINIMALLY INVASIVE DECOMPRESSION AND TRANSFORAMINAL LUMBAR INTERBODY FUSION LUMBAR FIVE-SACRAL ONE, RIGHT SIDED APPROACH;  Surgeon: Karsten Ro, DO;  Location: [place];  Service: Neurosurgery;  Laterality: Right;  3C   Patient Active Problem List   Diagnosis Date Noted   Spondylolisthesis, lumbar region 10/04/2021   Anterolisthesis 12/15/2015   Controlled diabetes mellitus type II without complication ([place]) 97/67/3419   Erectile dysfunction 10/19/2012   Type 2 diabetes mellitus ([place]) 05/12/2012   Right inguinal hernia 08/01/2011   Bilateral renal cysts 08/01/2011   OVERWEIGHT 01/22/2009   ESSENTIAL HYPERTENSION, BENIGN 09/11/2006    REFERRING DIAG: Spondylolisthesis of lumbar region  THERAPY DIAG:  Other low back pain  Muscle weakness (generalized)  Other abnormalities of gait and mobility  Rationale for Evaluation and Treatment Rehabilitation  PERTINENT  HISTORY: L5-S1 decompression and TLIF 10/04/2021, DM, HTN  PRECAUTIONS: None   SUBJECTIVE: Patient reports he has been doing walking, he walked about an hour yesterday and he does get some soreness and discomfort. He did get a little catch in his back yesterday after walking.   PAIN:  Are you having pain? 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   OBJECTIVE: (objective measures completed at initial evaluation unless otherwise dated) PATIENT SURVEYS:  FOTO 50% functional status  02/22/2022: 57% functional status  03/29/2022: 56% (met goal)   MUSCLE LENGTH: Hamstring and quad/hip flexor flexibility deficit   POSTURE:            Rounded shoulder and forward head    PALPATION: Tender to palpation lumbar paraspinals   LUMBAR ROM:    Active  AROM  eval  03/01/2022  Flexion 25% 25%  Extension 50% 50%  Right lateral flexion 50% 50%  Left lateral flexion 50% 50%  Right rotation 50% 50%  Left rotation 50% 50%                        Patient reports pain mainly in lumbar flexion   LOWER EXTREMITY MMT:  MMT Right eval Left eval Rt / Lt 01/16/2022 Rt / Lt 01/25/2022 Rt / Lt 02/01/2022 Rt / Lt 02/08/2022 Rt / Lt 02/22/2022 Rt / Lt 03/01/2022  Hip flexion 4 4        Hip extension 3- 3-  3- / 3- 3- / 3- 3- / 3+ 3 / 3+ 3 / 3+  Hip abduction 3- 3- 3 / 3 3 / 3    3+ / 3+  Knee flexion 5 5        Knee extension 5 5        Ankle dorsiflexion 5 5        Ankle plantarflexion 3 4          FUNCTIONAL TESTS:  Sit to stand: patient requires UE support to stand, demonstrates stiffness with standing, reports low back pain with pushing to stand  03/01/2022: Patient remains limited with sit to stand, requires UE to assist  03/15/2022: patient able to  perform sit to stand without using hands   GAIT: Assistive device utilized: None Level of assistance: Complete Independence Comments: Antalgic on right, decreased trunk rotation  03/24/2022: grossly WFL     TODAY'S TREATMENT  [facility] Adult PT Treatment:                                                DATE: 03/29/2022 Therapeutic Exercise: NuStep L7 x 5 min with UE/LE while taking subjective Leg press (cybex) 120# 3 x 10 Forward 8" step-up 2 x 10 TRX squat x 10 Bridge partial range 2 x 10 Sidelying hip abduction 2 x 10 each Sit to stand x 10 without UE support Dead lift 45# from 8" box 2 x 10   [facility] Adult PT Treatment:                                                DATE: 03/24/2022 Therapeutic Exercise: NuStep L7 x 5 min with UE/LE while taking subjective Leg press (cybex) 100# x 15, 120# 3 x 10 Dead lift 30# from 8" box 3 x 10 TRX squat x 10 Sit to stand without UE support 2 x 10 Bridge partial range 2 x 10 Hooklying abdominal set 2 x 5 with 5 sec hold Seated lumbar flexion physioball roll out 5 x 10 sec  [facility] Adult PT Treatment:                                                DATE: 03/15/2022 Therapeutic Exercise: NuStep L7 x 5 min with UE/LE while taking subjective Leg press (cybex) 80# x 15, 100# 2 x 15 Hip abduction machine (cybex) 30# 2 x 15 each Forward 10" step-up 2 x 15 each Deadlift 25# from 8" box 2 x 15 Sit to stand without UE support x 10  PATIENT EDUCATION:  Education details: HEP Person educated: Patient Education method: Explanation, Demonstration, Tactile cues, Verbal cues Education comprehension: verbalized understanding, returned demonstration, verbal cues required, tactile cues required, and needs further education   HOME EXERCISE PROGRAM: Access Code: [email or web address]     ASSESSMENT: CLINICAL IMPRESSION: Patient  tolerated therapy well with no adverse effects. Therapy continues to focus on progress strengthening for hips and core region. He seems to be  improving with his tolerance for strengthening but does continue to have greater difficulty on right side with hip strengthening. He was able to increase weight with lifting this visit and did require cueing for proper technique. He does report a slight reduction on FOTO but he has achieved his FOTO goal. No changes made to HEP this visit. Patient would benefit from continued PT to progress mobility and strength in order to reduce pain and maximize functional ability.     OBJECTIVE IMPAIRMENTS Abnormal gait, decreased activity tolerance, decreased ROM, decreased strength, impaired flexibility, improper body mechanics, postural dysfunction, and pain.    ACTIVITY LIMITATIONS carrying, lifting, bending, sitting, standing, squatting, continence, and locomotion level   PARTICIPATION LIMITATIONS: meal prep, cleaning, laundry, shopping, community activity, occupation, and yard work   PERSONAL Education officer, community, Past/current experiences, Profession, and Time since onset of injury/illness/exacerbation are also affecting patient's functional outcome.      GOALS: Goals reviewed with patient? Yes   SHORT TERM GOALS: Target date: 03/29/2022   Patient will be I with initial HEP in order to progress with therapy. Baseline: HEP provided at eval 02/01/2022: progressing 03/01/2022: independent with initial HEP Goal status: MET   2.  PT will review FOTO with patient by 3rd visit in order to understand expected progress and outcome with therapy. Baseline: FOTO assessed at eval 02/01/2022: reviewed Goal status: MET   3.  Patient will report pain level </= 3/10 in order to reduce functional limitations with walking. Baseline: 6/10 pain level 02/01/2022: patient continues to report 3/10 pain at rest that increases with activity Goal status: ONGOING   4. Patient will be able to perform sit to stand using proper technique and without using UE in order to indicate improve LE strength and mobility             Baseline: patient requires UE to perform sit to stand 02/01/2022: patient still limited with sit to stand  03/01/2022: patient still limited with sit to stand  03/15/2022: able to perform sit to stand without hands            Goal Status: MET   LONG TERM GOALS: Target date: 04/12/2022   Patient will be I with final HEP to maintain progress from PT. Baseline: HEP provided at eval 03/01/2022: progressing toward final HEP Goal status: ONGOING   2.  Patient will report >/= 56% status on FOTO to indicate improved functional ability. Baseline: 50% functional status 02/22/2022: 57% functional status 03/29/2022: 56% functional status Goal status: MET   3.  Patient will demonstrate core and hip strength >/= 4/5 MMT in order to reduce limitations with lifting so he can return to work without limitations Baseline: core and hip strength grossly 3-/5 MMT 03/01/2022: patient continues to exhibit gross hip strength deficit Goal status: ONGOING   4.  Patient will demonstrate lumbar flexion >/= 50% without increased pain in order to improve ability to bend or stoop without limitation to return to work Baseline: lumbar flexion grossly 25% with increased pain 03/01/2022: patient continues to exhibit lumbar mobility deficits Goal status: ONGOING     PLAN: PT FREQUENCY: 1x/week   PT DURATION: 6 weeks   PLANNED INTERVENTIONS: Therapeutic exercises, Therapeutic activity, Neuromuscular re-education, Balance training, Gait training, Patient/Family education, Self Care, Joint mobilization, Joint manipulation, Aquatic Therapy, Dry Needling, Electrical stimulation, Spinal manipulation, Spinal mobilization, Cryotherapy, Moist heat,  Taping, Manual therapy, and Re-evaluation.   PLAN FOR NEXT SESSION: Review HEP and progress PRN, stretching for lumbar mobility, hamstring and quad/hip flexor stretching, progress core and hip strengthening especially hip extension, lifting mechanics     Hilda Blades, PT, DPT, LAT,  ATC 03/29/22  10:46 AM Phone: [phone] Fax: [phone]

## 2022-03-31 ENCOUNTER — Other Ambulatory Visit: Payer: Self-pay | Admitting: Internal Medicine

## 2022-04-04 NOTE — Therapy (Signed)
OUTPATIENT PHYSICAL THERAPY TREATMENT NOTE   Patient Name: Chad Gardner MRN: [id number] DOB:1965/12/16, 56 y.o., male Today's Date: 04/05/2022  PCP: Elby Showers, MD   REFERRING PROVIDER: Dawley, Theodoro Doing, DO   END OF SESSION:   PT End of Session - 04/05/22 1005     Visit Number 13    Number of Visits 14    Date for PT Re-Evaluation 04/12/22    Authorization Type BCBS    PT Start Time 1000    PT Stop Time 1045    PT Time Calculation (min) 45 min    Activity Tolerance Patient tolerated treatment well    Behavior During Therapy WFL for tasks assessed/performed                  Past Medical History:  Diagnosis Date   Arthritis    Diabetes mellitus without complication ([place])    Hypertension    Overactive bladder    Tendonitis of wrist, left    Past Surgical History:  Procedure Laterality Date   BACK SURGERY     ruptured disc   SHOULDER ARTHROSCOPY Left 09/24/2019   TENDON RELEASE  06/05/2009   Lt wrist   TRANSFORAMINAL LUMBAR INTERBODY FUSION W/ MIS 1 LEVEL Right 10/04/2021   Procedure: MINIMALLY INVASIVE DECOMPRESSION AND TRANSFORAMINAL LUMBAR INTERBODY FUSION LUMBAR FIVE-SACRAL ONE, RIGHT SIDED APPROACH;  Surgeon: Karsten Ro, DO;  Location: [facility];  Service: Neurosurgery;  Laterality: Right;  3C   Patient Active Problem List   Diagnosis Date Noted   Spondylolisthesis, lumbar region 10/04/2021   Anterolisthesis 12/15/2015   Controlled diabetes mellitus type II without complication ([place]) 68/08/2120   Erectile dysfunction 10/19/2012   Type 2 diabetes mellitus ([place]) 05/12/2012   Right inguinal hernia 08/01/2011   Bilateral renal cysts 08/01/2011   OVERWEIGHT 01/22/2009   ESSENTIAL HYPERTENSION, BENIGN 09/11/2006    REFERRING DIAG: Spondylolisthesis of lumbar region  THERAPY DIAG:  Other low back pain  Muscle weakness (generalized)  Other abnormalities of gait and mobility  Rationale for Evaluation and Treatment Rehabilitation  PERTINENT  HISTORY: L5-S1 decompression and TLIF 10/04/2021, DM, HTN  PRECAUTIONS: None   SUBJECTIVE: Patient reports he has been doing walking, he walked about an hour yesterday and he does get some soreness and discomfort. He did get a little catch in his back yesterday after walking.   PAIN:  Are you having pain? 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   OBJECTIVE: (objective measures completed at initial evaluation unless otherwise dated) PATIENT SURVEYS:  FOTO 50% functional status  02/22/2022: 57% functional status  03/29/2022: 56% (met goal)   MUSCLE LENGTH: Hamstring and quad/hip flexor flexibility deficit   POSTURE:            Rounded shoulder and forward head    PALPATION: Tender to palpation lumbar paraspinals   LUMBAR ROM:    Active  AROM  eval  03/01/2022  Flexion 25% 25%  Extension 50% 50%  Right lateral flexion 50% 50%  Left lateral flexion 50% 50%  Right rotation 50% 50%  Left rotation 50% 50%                        Patient reports pain mainly in lumbar flexion   LOWER EXTREMITY MMT:  MMT Right eval Left eval Rt / Lt 01/16/2022 Rt / Lt 01/25/2022 Rt / Lt 02/01/2022 Rt / Lt 02/08/2022 Rt / Lt 02/22/2022 Rt / Lt 03/01/2022 Rt / Lt 04/05/2022  Hip flexion 4 4         Hip extension 3- 3-  3- / 3- 3- / 3- 3- / 3+ 3 / 3+ 3 / 3+   Hip abduction 3- 3- 3 / 3 3 / 3    3+ / 3+ 4- / 4-  Knee flexion 5 5         Knee extension 5 5         Ankle dorsiflexion 5 5         Ankle plantarflexion 3 4           FUNCTIONAL TESTS:  Sit to stand: patient requires UE support to stand, demonstrates stiffness with standing, reports low back pain with pushing to stand  03/01/2022: Patient remains limited with sit to stand, requires UE to  assist  03/15/2022: patient able to perform sit to stand without using hands   GAIT: Assistive device utilized: None Level of assistance: Complete Independence Comments: Antalgic on right, decreased trunk rotation  03/24/2022: grossly WFL     TODAY'S TREATMENT  [facility] Adult PT Treatment:                                                DATE: 04/05/2022 Therapeutic Exercise: NuStep L7 x 5 min with UE/LE while taking subjective Leg press (cybex) 140# x 10, 160# x 10, 180# x 10 Dead lift 45# from 8" box 3 x 10 Pallof press with red powerband 2 x 10 each TRX squat x 10 Sit to stand x 10 without UE support Bridge partial range 2 x 10 LTR x 10 Seated lumbar flexion physioball roll out 5 x 10 sec   [facility] Adult PT Treatment:                                                DATE: 03/29/2022 Therapeutic Exercise: NuStep L7 x 5 min with UE/LE while taking subjective Leg press (cybex) 120# 3 x 10 Forward 8" step-up 2 x 10 TRX squat x 10 Bridge partial range 2 x 10 Sidelying hip abduction 2 x 10 each Sit to stand x 10 without UE support Dead lift 45# from 8" box 2 x 10  [facility] Adult PT Treatment:                                                DATE: 03/24/2022 Therapeutic Exercise: NuStep L7 x 5 min with UE/LE while taking subjective Leg press (cybex) 100# x 15, 120# 3 x 10 Dead lift 30# from 8" box 3 x 10 TRX squat x 10 Sit to stand without UE support 2 x 10 Bridge partial range 2 x 10 Hooklying abdominal set 2 x 5 with 5 sec hold Seated lumbar flexion physioball roll out 5 x 10 sec  PATIENT EDUCATION:  Education details: HEP Person educated: Patient Education method: Consulting civil engineer, Media planner, Corporate treasurer cues, Verbal cues  Education comprehension: verbalized understanding, returned demonstration, verbal cues required, tactile cues required, and needs further education   HOME EXERCISE PROGRAM: Access Code: [email or web address]     ASSESSMENT: CLINICAL IMPRESSION: Patient tolerated therapy well  with no adverse effects. Therapy continues to focus on progressing core and LE strengthening to improve his functional mobility. He reports continued catching of right lower back region primarily with bed mobility. He was able to perform strengthening exercises with increased resistance this visit. Patient does report fatigue post therapy. No changes made to HEP.  Patient would benefit from continued PT to progress mobility and strength in order to reduce pain and maximize functional ability.     OBJECTIVE IMPAIRMENTS Abnormal gait, decreased activity tolerance, decreased ROM, decreased strength, impaired flexibility, improper body mechanics, postural dysfunction, and pain.    ACTIVITY LIMITATIONS carrying, lifting, bending, sitting, standing, squatting, continence, and locomotion level   PARTICIPATION LIMITATIONS: meal prep, cleaning, laundry, shopping, community activity, occupation, and yard work   PERSONAL Education officer, community, Past/current experiences, Profession, and Time since onset of injury/illness/exacerbation are also affecting patient's functional outcome.      GOALS: Goals reviewed with patient? Yes   SHORT TERM GOALS: Target date: 03/29/2022   Patient will be I with initial HEP in order to progress with therapy. Baseline: HEP provided at eval 02/01/2022: progressing 03/01/2022: independent with initial HEP Goal status: MET   2.  PT will review FOTO with patient by 3rd visit in order to understand expected progress and outcome with therapy. Baseline: FOTO assessed at eval 02/01/2022: reviewed Goal status: MET   3.  Patient will report pain level </= 3/10 in order to reduce functional limitations with walking. Baseline: 6/10 pain level 02/01/2022: patient continues to report 3/10 pain at rest that increases with activity Goal status: ONGOING   4. Patient will be able to perform sit to stand using proper technique and without using UE in order to indicate improve LE strength and  mobility            Baseline: patient requires UE to perform sit to stand 02/01/2022: patient still limited with sit to stand  03/01/2022: patient still limited with sit to stand  03/15/2022: able to perform sit to stand without hands            Goal Status: MET   LONG TERM GOALS: Target date: 04/12/2022   Patient will be I with final HEP to maintain progress from PT. Baseline: HEP provided at eval 03/01/2022: progressing toward final HEP Goal status: ONGOING   2.  Patient will report >/= 56% status on FOTO to indicate improved functional ability. Baseline: 50% functional status 02/22/2022: 57% functional status 03/29/2022: 56% functional status Goal status: MET   3.  Patient will demonstrate core and hip strength >/= 4/5 MMT in order to reduce limitations with lifting so he can return to work without limitations Baseline: core and hip strength grossly 3-/5 MMT 03/01/2022: patient continues to exhibit gross hip strength deficit Goal status: ONGOING   4.  Patient will demonstrate lumbar flexion >/= 50% without increased pain in order to improve ability to bend or stoop without limitation to return to work Baseline: lumbar flexion grossly 25% with increased pain 03/01/2022: patient continues to exhibit lumbar mobility deficits Goal status: ONGOING     PLAN: PT FREQUENCY: 1x/week   PT DURATION: 6 weeks   PLANNED INTERVENTIONS: Therapeutic exercises, Therapeutic activity, Neuromuscular re-education, Balance training, Gait training, Patient/Family education, Self Care, Joint mobilization, Joint manipulation, Aquatic Therapy, Dry  Needling, Electrical stimulation, Spinal manipulation, Spinal mobilization, Cryotherapy, Moist heat, Taping, Manual therapy, and Re-evaluation.   PLAN FOR NEXT SESSION: Review HEP and progress PRN, stretching for lumbar mobility, hamstring and quad/hip flexor stretching, progress core and hip strengthening especially hip extension, lifting mechanics     Hilda Blades, PT, DPT, LAT, ATC 04/05/22  10:52 AM Phone: [phone] Fax: [phone]

## 2022-04-05 ENCOUNTER — Ambulatory Visit
Admission: RE | Admit: 2022-04-05 | Discharge: 2022-04-05 | Disposition: A | Discharge status: Home / Self Care | Payer: BC Managed Care – PPO | Source: Ambulatory Visit | Attending: Neurological Surgery | Admitting: Neurological Surgery

## 2022-04-05 ENCOUNTER — Other Ambulatory Visit: Payer: Self-pay

## 2022-04-05 ENCOUNTER — Ambulatory Visit: Payer: BC Managed Care – PPO | Attending: Neurological Surgery | Admitting: Physical Therapy

## 2022-04-05 ENCOUNTER — Encounter: Payer: Self-pay | Admitting: Physical Therapy

## 2022-04-05 DIAGNOSIS — R2689 Other abnormalities of gait and mobility: Secondary | ICD-10-CM | POA: Insufficient documentation

## 2022-04-05 DIAGNOSIS — M5459 Other low back pain: Secondary | ICD-10-CM | POA: Diagnosis not present

## 2022-04-05 DIAGNOSIS — M4722 Other spondylosis with radiculopathy, cervical region: Secondary | ICD-10-CM

## 2022-04-05 DIAGNOSIS — M6281 Muscle weakness (generalized): Secondary | ICD-10-CM | POA: Insufficient documentation

## 2022-04-07 ENCOUNTER — Other Ambulatory Visit: Payer: BC Managed Care – PPO

## 2022-04-11 NOTE — Therapy (Signed)
OUTPATIENT PHYSICAL THERAPY TREATMENT NOTE  DISCHARGE   Patient Name: Chad Gardner MRN: [id number] DOB:02/10/1966, 56 y.o., male Today's Date: 04/12/2022  PCP: Elby Showers, MD   REFERRING PROVIDER: Dawley, Theodoro Doing, DO   END OF SESSION:   PT End of Session - 04/12/22 1008     Visit Number 14    Number of Visits 14    Date for PT Re-Evaluation 04/12/22    Authorization Type BCBS    PT Start Time 1000    PT Stop Time 1035    PT Time Calculation (min) 35 min    Activity Tolerance Patient tolerated treatment well    Behavior During Therapy WFL for tasks assessed/performed               Past Medical History:  Diagnosis Date   Arthritis    Diabetes mellitus without complication ([place])    Hypertension    Overactive bladder    Tendonitis of wrist, left    Past Surgical History:  Procedure Laterality Date   BACK SURGERY     ruptured disc   SHOULDER ARTHROSCOPY Left 09/24/2019   TENDON RELEASE  06/05/2009   Lt wrist   TRANSFORAMINAL LUMBAR INTERBODY FUSION W/ MIS 1 LEVEL Right 10/04/2021   Procedure: MINIMALLY INVASIVE DECOMPRESSION AND TRANSFORAMINAL LUMBAR INTERBODY FUSION LUMBAR FIVE-SACRAL ONE, RIGHT SIDED APPROACH;  Surgeon: Karsten Ro, DO;  Location: [facility];  Service: Neurosurgery;  Laterality: Right;  3C   Patient Active Problem List   Diagnosis Date Noted   Spondylolisthesis, lumbar region 10/04/2021   Anterolisthesis 12/15/2015   Controlled diabetes mellitus type II without complication ([place]) 42/59/5638   Erectile dysfunction 10/19/2012   Type 2 diabetes mellitus ([place]) 05/12/2012   Right inguinal hernia 08/01/2011   Bilateral renal cysts 08/01/2011   OVERWEIGHT 01/22/2009   ESSENTIAL HYPERTENSION, BENIGN 09/11/2006    REFERRING DIAG: Spondylolisthesis of lumbar region  THERAPY DIAG:  Other low back pain  Muscle weakness (generalized)  Other abnormalities of gait and mobility  Rationale for Evaluation and Treatment  Rehabilitation  PERTINENT HISTORY: L5-S1 decompression and TLIF 10/04/2021, DM, HTN  PRECAUTIONS: None   SUBJECTIVE: Patient he follows-up with his doctor regarding his neck and shoulder today following his MRI. He has still been doing his walking, up to an hour of walking. He states he feels he is at about 70% based on his back. He doesn't have consistent pain in the back and the catch that occurs mainly with turning in bed or if has been sitting a while and goes to get up.   PAIN:  Are you having pain? 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   OBJECTIVE: (objective measures completed at initial evaluation unless otherwise dated) PATIENT SURVEYS:  FOTO 50% functional status  02/22/2022: 57% functional status  03/29/2022: 56% (met goal)   MUSCLE LENGTH: Hamstring and quad/hip flexor flexibility deficit   POSTURE:            Rounded shoulder and forward head    PALPATION: Tender to palpation lumbar paraspinals   LUMBAR ROM:    Active  AROM  eval  03/01/2022  04/12/2022  Flexion 25% 25% 75% - fingertips mid shin  Extension 50% 50%   Right lateral flexion 50% 50%   Left lateral flexion 50% 50%  Right rotation 50% 50%   Left rotation 50% 50%                    LOWER EXTREMITY MMT:     MMT Right eval Left eval Rt / Lt 01/16/2022 Rt / Lt 01/25/2022 Rt / Lt 02/01/2022 Rt / Lt 02/08/2022 Rt / Lt 02/22/2022 Rt / Lt 03/01/2022 Rt / Lt 04/05/2022 Rt / Lt 04/12/2022  Hip flexion 4 4          Hip extension 3- 3-  3- / 3- 3- / 3- 3- / 3+ 3 / 3+ 3 / 3+  3+ / 4-  Hip abduction 3- 3- 3 / 3 3 / 3    3+ / 3+ 4- / 4- 4- / 4-  Knee flexion 5 5          Knee extension 5 5          Ankle dorsiflexion 5 5          Ankle plantarflexion 3 4             FUNCTIONAL TESTS:  Sit to stand: patient requires UE support to stand, demonstrates stiffness with standing, reports low back pain with pushing to stand  03/01/2022: Patient remains limited with sit to stand, requires UE to assist  03/15/2022: patient able to perform sit to stand without using hands   GAIT: Assistive device utilized: None Level of assistance: Complete Independence Comments: Antalgic on right, decreased trunk rotation  03/24/2022: grossly WFL     TODAY'S TREATMENT  [facility] Adult PT Treatment:                                                DATE: 04/12/2022 Therapeutic Exercise: NuStep L8 x 5 min with UE/LE while taking subjective TRX squat x 10 Sit to stand 2 x 10 without UE support Sidelying hip abduction x 15 each Bridge x 10 LTR x 10 Piriformis stretch 2 x 30 sec each Seated lumbar flexion physioball roll out 5 x 10 sec   [facility] Adult PT Treatment:                                                DATE: 04/05/2022 Therapeutic Exercise: NuStep L7 x 5 min with UE/LE while taking subjective Leg press (cybex) 140# x 10, 160# x 10, 180# x 10 Dead lift 45# from 8" box 3 x 10 Pallof press with red powerband 2 x 10 each TRX squat x 10 Sit to stand x 10 without UE support Bridge partial range 2 x 10 LTR x 10 Seated lumbar flexion physioball roll out 5 x 10 sec  [facility] Adult PT Treatment:                                                DATE: 03/29/2022 Therapeutic Exercise: NuStep L7 x 5 min with UE/LE while taking subjective Leg press (cybex) 120# 3 x 10 Forward 8" step-up 2 x 10 TRX squat x 10 Bridge partial range 2 x 10 Sidelying hip abduction 2 x 10 each  Sit to stand x 10 without UE support Dead lift 45# from 8" box 2 x 10  PATIENT EDUCATION:  Education details: POC discharge, HEP Person educated: Patient Education method: Consulting civil engineer, Demonstration Education comprehension: verbalized understanding, returned demonstration   HOME EXERCISE PROGRAM: Access Code:  [email or web address]     ASSESSMENT: CLINICAL IMPRESSION: Patient tolerated therapy well with no adverse effects. Patient overall demonstrates an improvement in his strength and mobility, and reports improvement in his functional ability. He does continue to have pain with movement such as bed mobility and and extended periods of activity. He has achieved all established goals except his strength goal. He is going to follow-up with his surgeon regarding back and neck/shoulder pain. He will be formally discharged from PT per request to independent HEP and will return with new referrals for neck/shoulder and back as needed.      OBJECTIVE IMPAIRMENTS Abnormal gait, decreased activity tolerance, decreased ROM, decreased strength, impaired flexibility, improper body mechanics, postural dysfunction, and pain.    ACTIVITY LIMITATIONS carrying, lifting, bending, sitting, standing, squatting, continence, and locomotion level   PARTICIPATION LIMITATIONS: meal prep, cleaning, laundry, shopping, community activity, occupation, and yard work   PERSONAL Education officer, community, Past/current experiences, Profession, and Time since onset of injury/illness/exacerbation are also affecting patient's functional outcome.      GOALS: Goals reviewed with patient? Yes   SHORT TERM GOALS: Target date: 03/29/2022   Patient will be I with initial HEP in order to progress with therapy. Baseline: HEP provided at eval 02/01/2022: progressing 03/01/2022: independent with initial HEP Goal status: MET   2.  PT will review FOTO with patient by 3rd visit in order to understand expected progress and outcome with therapy. Baseline: FOTO assessed at eval 02/01/2022: reviewed Goal status: MET   3.  Patient will report pain level </= 3/10 in order to reduce functional limitations with walking. Baseline: 6/10 pain level 02/01/2022: patient continues to report 3/10 pain at rest that increases with activity 04/12/2022: patient reports 3/10  pain Goal status: MET   4. Patient will be able to perform sit to stand using proper technique and without using UE in order to indicate improve LE strength and mobility            Baseline: patient requires UE to perform sit to stand 02/01/2022: patient still limited with sit to stand  03/01/2022: patient still limited with sit to stand  03/15/2022: able to perform sit to stand without hands            Goal Status: MET   LONG TERM GOALS: Target date: 04/12/2022   Patient will be I with final HEP to maintain progress from PT. Baseline: HEP provided at eval 03/01/2022: progressing toward final HEP 04/12/2022: independent Goal status: MET   2.  Patient will report >/= 56% status on FOTO to indicate improved functional ability. Baseline: 50% functional status 02/22/2022: 57% functional status 03/29/2022: 56% functional status Goal status: MET   3.  Patient will demonstrate core and hip strength >/= 4/5 MMT in order to reduce limitations with lifting so he can return to work without limitations Baseline: core and hip strength grossly 3-/5 MMT 03/01/2022: patient continues to exhibit gross hip strength deficit 04/12/2022: patient continues to exhibit gross hip strength deficit, see above Goal status: PARTIALLY MET   4.  Patient will demonstrate lumbar flexion >/= 50% without increased pain in order to improve ability to bend or stoop without limitation to return to work Baseline: lumbar flexion grossly 25%  with increased pain 03/01/2022: patient continues to exhibit lumbar mobility deficits 04/12/2022: patient demonstrates lumbar flexion 75% Goal status: MET     PLAN: PT FREQUENCY: 1x/week   PT DURATION: 6 weeks   PLANNED INTERVENTIONS: Therapeutic exercises, Therapeutic activity, Neuromuscular re-education, Balance training, Gait training, Patient/Family education, Self Care, Joint mobilization, Joint manipulation, Aquatic Therapy, Dry Needling, Electrical stimulation, Spinal  manipulation, Spinal mobilization, Cryotherapy, Moist heat, Taping, Manual therapy, and Re-evaluation.   PLAN FOR NEXT SESSION: Review HEP and progress PRN, stretching for lumbar mobility, hamstring and quad/hip flexor stretching, progress core and hip strengthening especially hip extension, lifting mechanics     Hilda Blades, PT, DPT, LAT, ATC 04/12/22  10:42 AM Phone: [phone] Fax: [phone]   PHYSICAL THERAPY DISCHARGE SUMMARY  Visits from Start of Care: 14  Current functional level related to goals / functional outcomes: See above   Remaining deficits: See above   Education / Equipment: HEP   Patient agrees to discharge. Patient goals were partially met. Patient is being discharged due to the patient's request.

## 2022-04-12 ENCOUNTER — Encounter: Payer: Self-pay | Admitting: Physical Therapy

## 2022-04-12 ENCOUNTER — Other Ambulatory Visit: Payer: Self-pay

## 2022-04-12 ENCOUNTER — Ambulatory Visit: Payer: BC Managed Care – PPO | Admitting: Physical Therapy

## 2022-04-12 DIAGNOSIS — M5459 Other low back pain: Secondary | ICD-10-CM | POA: Diagnosis not present

## 2022-04-12 DIAGNOSIS — M6281 Muscle weakness (generalized): Secondary | ICD-10-CM

## 2022-04-12 DIAGNOSIS — R2689 Other abnormalities of gait and mobility: Secondary | ICD-10-CM

## 2022-04-12 NOTE — Patient Instructions (Addendum)
Access Code: [email or web address] URL: [email or web address] Date: 04/12/2022 Prepared by: Rosana Hoes  Exercises - Hooklying Single Knee to Chest Stretch  - 2 x daily - 3 reps - 20 seconds hold - Supine Piriformis Stretch with Foot on Ground  - 2 x daily - 3 reps - 20 seconds hold - Supine Lower Trunk Rotation  - 2 x daily - 10 reps - 5 seconds hold - Straight Leg Raise  - 1 x daily - 2 sets - 8-10 reps - Bridge  - 1 x daily - 2 sets - 8-10 reps - Sidelying Hip Abduction  - 1 x daily - 2 sets - 10 reps - Seated Hamstring Stretch  - 2 x daily - 3 reps - 20 seconds hold - Hip Extension with Resistance Loop  - 1 x daily - 2 sets - 10 reps - Squat with Counter Support  - 1 x daily - 3 sets - 10 reps

## 2022-05-08 ENCOUNTER — Other Ambulatory Visit: Payer: Self-pay | Admitting: Internal Medicine

## 2022-05-31 ENCOUNTER — Other Ambulatory Visit: Payer: Self-pay | Admitting: Internal Medicine

## 2022-06-09 LAB — HM DIABETES EYE EXAM

## 2022-06-12 ENCOUNTER — Encounter: Payer: Self-pay | Admitting: Internal Medicine

## 2022-07-03 ENCOUNTER — Ambulatory Visit: Payer: BC Managed Care – PPO | Admitting: Physical Therapy

## 2022-07-03 NOTE — Therapy (Incomplete)
OUTPATIENT PHYSICAL THERAPY THORACOLUMBAR EVALUATION   Patient Name: Chad Gardner MRN: [id number] DOB:08/26/1965, 57 y.o., male Today's Date: 07/03/2022  END OF SESSION:   Past Medical History:  Diagnosis Date   Arthritis    Diabetes mellitus without complication ([place])    Hypertension    Overactive bladder    Tendonitis of wrist, left    Past Surgical History:  Procedure Laterality Date   BACK SURGERY     ruptured disc   SHOULDER ARTHROSCOPY Left 09/24/2019   TENDON RELEASE  06/05/2009   Lt wrist   TRANSFORAMINAL LUMBAR INTERBODY FUSION W/ MIS 1 LEVEL Right 10/04/2021   Procedure: MINIMALLY INVASIVE DECOMPRESSION AND TRANSFORAMINAL LUMBAR INTERBODY FUSION LUMBAR FIVE-SACRAL ONE, RIGHT SIDED APPROACH;  Surgeon: Karsten Ro, DO;  Location: [place];  Service: Neurosurgery;  Laterality: Right;  3C   Patient Active Problem List   Diagnosis Date Noted   Spondylolisthesis, lumbar region 10/04/2021   Anterolisthesis 12/15/2015   Controlled diabetes mellitus type II without complication ([place]) 72/90/2111   Erectile dysfunction 10/19/2012   Type 2 diabetes mellitus ([place]) 05/12/2012   Right inguinal hernia 08/01/2011   Bilateral renal cysts 08/01/2011   OVERWEIGHT 01/22/2009   ESSENTIAL HYPERTENSION, BENIGN 09/11/2006    PCP: Elby Showers, MD  REFERRING PROVIDER: Dawley, Theodoro Doing, DO  REFERRING DIAG: Spondylolisthesis of lumbar region  Rationale for Evaluation and Treatment: Rehabilitation  THERAPY DIAG:  No diagnosis found.  ONSET DATE: ***   SUBJECTIVE:              SUBJECTIVE STATEMENT: ***  PERTINENT HISTORY:  ***  PAIN:  Are you having pain? Yes:  NPRS scale: ***/10 Pain location: *** Pain description: *** Aggravating factors: *** Relieving factors: ***  PRECAUTIONS: None  WEIGHT BEARING RESTRICTIONS: No  FALLS:  Has patient fallen in last 6 months? {fallsyesno:27318}  LIVING ENVIRONMENT: Lives with: {[facility] lives with:25569::"lives with their  family"} Lives in: {Lives in:25570} Stairs: {opstairs:27293} Has following equipment at home: {Assistive devices:23999}  OCCUPATION: ***  PLOF: Independent  PATIENT GOALS: ***   OBJECTIVE:  DIAGNOSTIC FINDINGS:  ***  PATIENT SURVEYS:  FOTO ***  SCREENING FOR RED FLAGS: Negative  COGNITION: Overall cognitive status: Within functional limits for tasks assessed     SENSATION: WFL  MUSCLE LENGTH: ***  POSTURE:   ***  PALPATION: ***  LUMBAR ROM:   AROM eval  Flexion   Extension   Right lateral flexion   Left lateral flexion   Right rotation   Left rotation    (Blank rows = not tested)  LOWER EXTREMITY ROM:     {AROM/PROM:27142}  Right eval Left eval  Hip flexion    Hip extension    Hip abduction    Hip adduction    Hip internal rotation    Hip external rotation    Knee flexion    Knee extension    Ankle dorsiflexion    Ankle plantarflexion    Ankle inversion    Ankle eversion     (Blank rows = not tested)  LOWER EXTREMITY MMT:    MMT Right eval Left eval  Hip flexion    Hip extension    Hip abduction    Hip adduction    Hip internal rotation    Hip external rotation    Knee flexion    Knee extension    Ankle dorsiflexion    Ankle plantarflexion    Ankle inversion    Ankle eversion     (Blank rows =  not tested)  LUMBAR SPECIAL TESTS:  {lumbar special test:25242}  FUNCTIONAL TESTS:  {Functional tests:24029}  GAIT: Distance walked: *** Assistive device utilized: {Assistive devices:23999} Level of assistance: {Levels of assistance:24026} Comments: ***   TODAY'S TREATMENT:         [facility] Adult PT Treatment:                                                DATE: 07/03/2022 Therapeutic Exercise: ***  PATIENT EDUCATION:  Education details: Exam findings, POC, HEP Person educated: Patient Education method: Explanation, Demonstration, Tactile cues, Verbal cues, and Handouts Education comprehension: verbalized understanding,  returned demonstration, verbal cues required, tactile cues required, and needs further education  HOME EXERCISE PROGRAM: ***   ASSESSMENT: CLINICAL IMPRESSION: Patient is a 57 y.o. male who was seen today for physical therapy evaluation and treatment for chronic low back pain following L5-S1 fusion on 10/04/2021. ***  OBJECTIVE IMPAIRMENTS: {opptimpairments:25111}.   ACTIVITY LIMITATIONS: {activitylimitations:27494}  PARTICIPATION LIMITATIONS: {participationrestrictions:25113}  PERSONAL FACTORS: {Personal factors:25162} are also affecting patient's functional outcome.   REHAB POTENTIAL: {rehabpotential:25112}  CLINICAL DECISION MAKING: {clinical decision making:25114}  EVALUATION COMPLEXITY: {Evaluation complexity:25115}   GOALS: Goals reviewed with patient? Yes  SHORT TERM GOALS: Target date: ***  Patient will be I with initial HEP in order to progress with therapy. Baseline: Goal status: INITIAL  2.  PT will review FOTO with patient by 3rd visit in order to understand expected progress and outcome with therapy. Baseline:  Goal status: INITIAL  3.  *** Baseline:  Goal status: INITIAL  LONG TERM GOALS: Target date: ***  Patient will be I with final HEP to maintain progress from PT. Baseline:  Goal status: INITIAL  2.  Patient will report >/= ***% status on FOTO to indicate improved functional ability. Baseline:  Goal status: INITIAL  3.  *** Baseline:  Goal status: INITIAL  4.  *** Baseline:  Goal status: INITIAL   PLAN: PT FREQUENCY: {rehab frequency:25116}  PT DURATION: {rehab duration:25117}  PLANNED INTERVENTIONS: {rehab planned interventions:25118::"Therapeutic exercises","Therapeutic activity","Neuromuscular re-education","Balance training","Gait training","Patient/Family education","Self Care","Joint mobilization"}.  PLAN FOR NEXT SESSION: ***    Hilda Blades, PT, DPT, LAT, ATC 07/03/22  12:23 PM Phone: [phone] Fax:  [phone]

## 2022-07-10 ENCOUNTER — Other Ambulatory Visit: Payer: BC Managed Care – PPO

## 2022-07-10 DIAGNOSIS — Z125 Encounter for screening for malignant neoplasm of prostate: Secondary | ICD-10-CM

## 2022-07-10 DIAGNOSIS — Z13220 Encounter for screening for lipoid disorders: Secondary | ICD-10-CM

## 2022-07-10 DIAGNOSIS — E119 Type 2 diabetes mellitus without complications: Secondary | ICD-10-CM

## 2022-07-10 DIAGNOSIS — I10 Essential (primary) hypertension: Secondary | ICD-10-CM

## 2022-07-11 LAB — COMPLETE METABOLIC PANEL WITH GFR
AG Ratio: 1.5 (calc) (ref 1.0–2.5)
ALT: 12 U/L (ref 9–46)
AST: 13 U/L (ref 10–35)
Albumin: 4.1 g/dL (ref 3.6–5.1)
Alkaline phosphatase (APISO): 75 U/L (ref 35–144)
BUN: 13 mg/dL (ref 7–25)
CO2: 29 mmol/L (ref 20–32)
Calcium: 9.1 mg/dL (ref 8.6–10.3)
Chloride: 103 mmol/L (ref 98–110)
Creat: 0.75 mg/dL (ref 0.70–1.30)
Globulin: 2.7 g/dL (calc) (ref 1.9–3.7)
Glucose, Bld: 168 mg/dL — ABNORMAL HIGH (ref 65–99)
Potassium: 4.2 mmol/L (ref 3.5–5.3)
Sodium: 140 mmol/L (ref 135–146)
Total Bilirubin: 0.5 mg/dL (ref 0.2–1.2)
Total Protein: 6.8 g/dL (ref 6.1–8.1)
eGFR: 105 mL/min/{1.73_m2} (ref >=60)

## 2022-07-11 LAB — HEMOGLOBIN A1C
Hgb A1c MFr Bld: 8 % of total Hgb — ABNORMAL HIGH (ref <5.7)
Mean Plasma Glucose: 183 mg/dL
eAG (mmol/L): 10.1 mmol/L

## 2022-07-11 LAB — LIPID PANEL
Cholesterol: 152 mg/dL (ref <200)
HDL: 43 mg/dL (ref >=40)
LDL Cholesterol (Calc): 92 mg/dL (calc)
Non-HDL Cholesterol (Calc): 109 mg/dL (calc) (ref <130)
Total CHOL/HDL Ratio: 3.5 (calc) (ref <5.0)
Triglycerides: 81 mg/dL (ref <150)

## 2022-07-11 LAB — CBC WITH DIFFERENTIAL/PLATELET
Absolute Monocytes: 389 cells/uL (ref 200–950)
Basophils Absolute: 11 cells/uL (ref 0–200)
Basophils Relative: 0.3 %
Eosinophils Absolute: 89 cells/uL (ref 15–500)
Eosinophils Relative: 2.4 %
HCT: 44.8 % (ref 38.5–50.0)
Hemoglobin: 15.4 g/dL (ref 13.2–17.1)
Lymphs Abs: 1598 cells/uL (ref 850–3900)
MCH: 28.7 pg (ref 27.0–33.0)
MCHC: 34.4 g/dL (ref 32.0–36.0)
MCV: 83.6 fL (ref 80.0–100.0)
MPV: 9.7 fL (ref 7.5–12.5)
Monocytes Relative: 10.5 %
Neutro Abs: 1613 cells/uL (ref 1500–7800)
Neutrophils Relative %: 43.6 %
Platelets: 215 10*3/uL (ref 140–400)
RBC: 5.36 10*6/uL (ref 4.20–5.80)
RDW: 13.1 % (ref 11.0–15.0)
Total Lymphocyte: 43.2 %
WBC: 3.7 10*3/uL — ABNORMAL LOW (ref 3.8–10.8)

## 2022-07-11 LAB — PSA: PSA: 0.31 ng/mL (ref <=4.00)

## 2022-07-11 LAB — MICROALBUMIN / CREATININE URINE RATIO
Creatinine, Urine: 213 mg/dL (ref 20–320)
Microalb Creat Ratio: 1 mcg/mg creat (ref <30)
Microalb, Ur: 0.3 mg/dL

## 2022-07-13 NOTE — Progress Notes (Signed)
Patient Care Team: Elby Showers, MD as PCP - General (Internal Medicine)  Visit Date: 07/18/22  Subjective:    Patient ID: Chad Gardner , Male   DOB: 06/22/1965, 57 y.o.    MRN: [id number]   57 y.o. Male presents today for annual comprehensive physical exam. Patient has a past medical history of diabetes mellitus, hypertension, arthritis, overactive bladder, left wrist tendonitis, cervical spondylosis.  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 He experiences intermittent neck pain with certain motions. He is having intermittent left and right shoulder pain as well. History of back pain. Started aquatic PT and dry needling last week. Stopped taking Oxycodone as of this past week and would like to avoid taking in the future.  History of Type 2 diabetes mellitus treated with Januvia 100 mg daily, Glucophage 500 mg daily with dinner, Glucotrol XL 10 mg daily with breakfast. HGBA1C at 8 on 07/10/22.  History of hypertension treated with HCTZ 25 mg daily, Cozaar 100 mg daily, Toprol-XL 25 mg daily. BP stable at 100/66 today, compared to 106/69 on 10/05/21.  PSA normal at 0.31 on 07/10/22.  Colonoscopy last completed 07/31/14. 3 benign polyps removed. Recommended repeat in 2026.   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  Resides alone.  Separated from wife.  He has a son working in Press photographer in [place] who just graduated with a Scientist, water quality.  Patient does not smoke.  Drinks beer socially when watching sports.  Does not drink any excess and recently has not been drinking  at all.       Family history: Father with history of prostate cancer, living.  Mother with history of stroke and hypertension.  They reside here in [place] and he tries to take care of them.  He has 2 brothers.  1 brother alive and well and the other brother has schizophrenia and hypertension.  1 sister overweight.      Review of Systems  Constitutional:  Negative for chills, fever, malaise/fatigue and weight loss.  HENT:  Negative for hearing loss, sinus pain and sore throat.   Respiratory:  Negative for cough and hemoptysis.   Cardiovascular:  Negative for chest pain, palpitations, leg swelling and PND.  Gastrointestinal:  Negative for abdominal pain, constipation, diarrhea, heartburn, nausea and vomiting.  Genitourinary:  Negative for dysuria, frequency and urgency.  Musculoskeletal:  Positive for joint pain (Bilateral shoulders, intermittent) and neck pain (Intermittent). Negative for back pain and myalgias.  Skin:  Negative for itching and rash.  Neurological:  Negative for dizziness, tingling, seizures and headaches.  Endo/Heme/Allergies:  Negative for polydipsia.  Psychiatric/Behavioral:  Negative for depression. The patient is not nervous/anxious.         Objective:   Vitals: BP 100/66   Pulse 74   Temp 98.6 F (37 C) (Tympanic)   Ht 6' 2"$  (1.88 m)   Wt 276 lb (125.2 kg)   SpO2 99%   BMI 35.44 kg/m    Physical Exam Vitals and nursing note  reviewed.  Constitutional:      General: He is awake. He is not in acute distress.    Appearance: Normal appearance. He is not ill-appearing or toxic-appearing.  HENT:     Head: Normocephalic and atraumatic.     Right Ear: Tympanic membrane, ear canal and external ear normal.     Left Ear: Tympanic membrane, ear canal and external ear normal.     Mouth/Throat:     Pharynx: Oropharynx is clear.  Eyes:     Extraocular Movements: Extraocular movements intact.     Pupils: Pupils are equal, round, and reactive to light.  Neck:      Thyroid: No thyroid mass, thyromegaly or thyroid tenderness.     Vascular: No carotid bruit.  Cardiovascular:     Rate and Rhythm: Normal rate and regular rhythm. No extrasystoles are present.    Pulses: Normal pulses.          Dorsalis pedis pulses are 2+ on the right side and 2+ on the left side.       Posterior tibial pulses are 2+ on the right side and 2+ on the left side.     Heart sounds: Normal heart sounds. No murmur heard.    No friction rub. No gallop.  Pulmonary:     Effort: Pulmonary effort is normal.     Breath sounds: Normal breath sounds. No decreased breath sounds, wheezing, rhonchi or rales.  Chest:     Chest wall: No mass.  Abdominal:     Palpations: Abdomen is soft.     Tenderness: There is no abdominal tenderness.     Hernia: No hernia is present.  Genitourinary:    Prostate: Normal. Not enlarged and no nodules present.     Comments: Prostate smooth upon examination. Musculoskeletal:     Cervical back: Normal range of motion.     Right knee: Crepitus present.     Left knee: No crepitus.     Right lower leg: No edema.     Left lower leg: No edema.     Comments: Crepitus appreciated in right knee.  Lymphadenopathy:     Cervical: No cervical adenopathy.     Upper Body:     Right upper body: No supraclavicular adenopathy.     Left upper body: No supraclavicular adenopathy.  Skin:    General: Skin is warm and dry.  Neurological:     General: No focal deficit present.     Mental Status: He is alert and oriented to person, place, and time. Mental status is at baseline.     Cranial Nerves: Cranial nerves 2-12 are intact.     Sensory: Sensation is intact.     Motor: Motor function is intact.     Coordination: Coordination is intact.     Gait: Gait is intact.     Deep Tendon Reflexes: Reflexes are normal and symmetric.  Psychiatric:        Attention and Perception: Attention normal.        Mood and Affect: Mood normal.        Speech: Speech normal.         Behavior: Behavior normal. Behavior is cooperative.        Thought Content: Thought content normal.        Cognition and Memory: Cognition and memory normal.        Judgment: Judgment normal.       Results:   Studies obtained and personally reviewed by me:  Colonoscopy  last completed 07/31/14. 3 benign polyps removed. Recommended repeat in 2026.  Labs:       Component Value Date/Time   NA 140 07/10/2022 0907   K 4.2 07/10/2022 0907   CL 103 07/10/2022 0907   CO2 29 07/10/2022 0907   GLUCOSE 168 (H) 07/10/2022 0907   GLUCOSE 97 04/30/2006 1334   BUN 13 07/10/2022 0907   CREATININE 0.75 07/10/2022 0907   CALCIUM 9.1 07/10/2022 0907   PROT 6.8 07/10/2022 0907   ALBUMIN 4.3 05/25/2016 1121   AST 13 07/10/2022 0907   ALT 12 07/10/2022 0907   ALKPHOS 63 05/25/2016 1121   BILITOT 0.5 07/10/2022 0907   GFRNONAA >60 09/26/2021 1339   GFRNONAA 91 07/05/2020 1140   GFRAA 106 07/05/2020 1140     Lab Results  Component Value Date   WBC 3.7 (L) 07/10/2022   HGB 15.4 07/10/2022   HCT 44.8 07/10/2022   MCV 83.6 07/10/2022   PLT 215 07/10/2022    Lab Results  Component Value Date   CHOL 152 07/10/2022   HDL 43 07/10/2022   LDLCALC 92 07/10/2022   TRIG 81 07/10/2022   CHOLHDL 3.5 07/10/2022    Lab Results  Component Value Date   HGBA1C 8.0 (H) 07/10/2022     No results found for: "TSH"   Lab Results  Component Value Date   PSA 0.31 07/10/2022   PSA 0.36 07/11/2021   PSA 0.54 07/05/2020      Assessment & Plan:   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 He is having intermittent left and right shoulder pain as well. Started aquatic PT and dry needling last week. Stopped taking Oxycodone as of this past week and would like to avoid taking in the future.  Type 2 diabetes mellitus: treated with Januvia 100 mg daily, Glucotrol XL 10 mg daily with breakfast. HGBA1C at 8 on  07/10/22. Increase Glucophage to 500 mg twice daily with dinner. Recheck A1C in spring.  History of hypertension: treated with HCTZ 25 mg daily, Cozaar 100 mg daily, Toprol-XL 25 mg daily. BP stable at 100/66 today, compared to 106/69 on 10/05/21.  Colonoscopy: Last completed 07/31/14. 3 benign polyps removed. Recommended repeat in 2026.  Vaccine Counseling: UTD on flu, tetanus vaccines. Declines flu, pneumococcal 20 vaccines. Discussed Covid-19 vaccine.    I,Alexander Ruley,acting as a Education administrator for Elby Showers, MD.,have documented all relevant documentation on the behalf of Elby Showers, MD,as directed by  Elby Showers, MD while in the presence of Elby Showers, MD.  I, Elby Showers, MD, have reviewed all documentation for this visit. The documentation on 07/23/22 for the exam, diagnosis, procedures, and orders are all accurate and complete.

## 2022-07-18 ENCOUNTER — Ambulatory Visit (INDEPENDENT_AMBULATORY_CARE_PROVIDER_SITE_OTHER): Payer: BC Managed Care – PPO | Admitting: Internal Medicine

## 2022-07-18 ENCOUNTER — Encounter: Payer: Self-pay | Admitting: Internal Medicine

## 2022-07-18 VITALS — BP 100/66 | HR 74 | Temp 98.6°F | Ht 74.0 in | Wt 276.0 lb

## 2022-07-18 DIAGNOSIS — R351 Nocturia: Secondary | ICD-10-CM

## 2022-07-18 DIAGNOSIS — M47812 Spondylosis without myelopathy or radiculopathy, cervical region: Secondary | ICD-10-CM | POA: Diagnosis not present

## 2022-07-18 DIAGNOSIS — Z9889 Other specified postprocedural states: Secondary | ICD-10-CM | POA: Diagnosis not present

## 2022-07-18 DIAGNOSIS — M19012 Primary osteoarthritis, left shoulder: Secondary | ICD-10-CM

## 2022-07-18 DIAGNOSIS — E119 Type 2 diabetes mellitus without complications: Secondary | ICD-10-CM

## 2022-07-18 DIAGNOSIS — Z0000 Encounter for general adult medical examination without abnormal findings: Secondary | ICD-10-CM | POA: Diagnosis not present

## 2022-07-18 DIAGNOSIS — I10 Essential (primary) hypertension: Secondary | ICD-10-CM

## 2022-07-18 DIAGNOSIS — N401 Enlarged prostate with lower urinary tract symptoms: Secondary | ICD-10-CM

## 2022-07-18 DIAGNOSIS — R4589 Other symptoms and signs involving emotional state: Secondary | ICD-10-CM

## 2022-07-18 DIAGNOSIS — F418 Other specified anxiety disorders: Secondary | ICD-10-CM

## 2022-07-18 DIAGNOSIS — M542 Cervicalgia: Secondary | ICD-10-CM

## 2022-07-18 LAB — POCT URINALYSIS DIPSTICK
Bilirubin, UA: NEGATIVE
Blood, UA: NEGATIVE
Glucose, UA: NEGATIVE
Ketones, UA: NEGATIVE
Leukocytes, UA: NEGATIVE
Nitrite, UA: NEGATIVE
Protein, UA: NEGATIVE
Spec Grav, UA: 1.015 (ref 1.010–1.025)
Urobilinogen, UA: 0.2 E.U./dL
pH, UA: 5 (ref 5.0–8.0)

## 2022-07-18 NOTE — Patient Instructions (Addendum)
Patient agrees to take metformin 500 mg twice a day. RTC in 6 weeks. Declines vaccines.Considerable time was spent with him regarding his work situation. Remains out of work due to persistent Neck and back pain. 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 He will discuss further with Dr. Reatha Armour. Patient has to stand on a ladder and raise his arms up over head to work on light fixtures. He is going to try some additional therapy in the near future. Continue BP meds.

## 2022-07-18 NOTE — Telephone Encounter (Signed)
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

## 2022-07-23 DIAGNOSIS — R4589 Other symptoms and signs involving emotional state: Secondary | ICD-10-CM | POA: Insufficient documentation

## 2022-07-23 DIAGNOSIS — F418 Other specified anxiety disorders: Secondary | ICD-10-CM | POA: Insufficient documentation

## 2022-07-23 DIAGNOSIS — M542 Cervicalgia: Secondary | ICD-10-CM | POA: Insufficient documentation

## 2022-07-31 ENCOUNTER — Other Ambulatory Visit: Payer: Self-pay | Admitting: Internal Medicine

## 2022-08-28 ENCOUNTER — Other Ambulatory Visit: Payer: BC Managed Care – PPO

## 2022-08-28 DIAGNOSIS — E119 Type 2 diabetes mellitus without complications: Secondary | ICD-10-CM

## 2022-08-29 LAB — HEMOGLOBIN A1C
Hgb A1c MFr Bld: 7.2 % of total Hgb — ABNORMAL HIGH (ref <5.7)
Mean Plasma Glucose: 160 mg/dL
eAG (mmol/L): 8.9 mmol/L

## 2022-08-29 NOTE — Progress Notes (Signed)
Patient Care Team: Elby Showers, MD as PCP - General (Internal Medicine)  Visit Date: 09/05/22  Subjective:    Patient ID: Chad Gardner , Male   DOB: 12-Dec-1965, 57 y.o.    MRN: [id number]   56 y.o. Male presents today for a 6 week follow-up. Patient has a past medical history of Type 2 diabetes mellitus, hypertension, arthritis, overactive bladder, left wrist tendonitis, cervical spondylosis.He had annual CPE here in mid February. He has decided he will apply for disability benefits. Feels the shoulder pain is not better and that neck pain has not improved either.  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 History of bilateral shoulder pain, left shoulder arthroscopy with acromial repair 09/24/19. History of back pain. Has been doing aquatic PT and dry needling. Limited range of motion in neck and right shoulder. He plans to consult his orthopedist if right shoulder pain continues. He is feeling emotionally down about facing retirement from his job but would like to wait on starting depression medications.   History of Type 2 diabetes mellitus treated with Glucotrol XL 10 mg daily with breakfast, Mounjaro 2.5 mg once weekly. HGBA1C at 7.2% on 08/28/22. Metformin, Januvia stopped 08/31/22 by Dr. Kelton Pillar. Reports losing a few ponds since recently starting Mounjaro.  History of hypertension treated with HCTZ 25 mg daily, Cozaar 100 mg daily, Toprol-XL 25 mg daily. BP stable at 122/70 today.  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  Separated from wife.  He has  a son working in Press photographer in [place] who just graduated with a Scientist, water quality.  Patient does not smoke.  Drinks beer socially when watching sports.  Does not drink any excess and recently has not been drinking at all.       Family history: Father with history of prostate cancer, living.  Mother with history of stroke and hypertension.  They reside here in [place] and he tries to take care of them.  He has 2 brothers.  1 brother alive and well and the other brother has schizophrenia and hypertension.  1 sister overweight.      Review of Systems  Constitutional:  Negative for fever and malaise/fatigue.  HENT:  Negative for congestion.   Eyes:  Negative for blurred vision.  Respiratory:  Negative for cough and shortness of breath.   Cardiovascular:  Negative for chest pain, palpitations and leg swelling.  Gastrointestinal:  Negative for vomiting.  Musculoskeletal:  Negative for back pain.  Skin:  Negative for rash.  Neurological:  Negative for loss of consciousness and headaches.        Objective:   Vitals: BP 122/70   Pulse 69   Temp 97.9 F (36.6 C) (Tympanic)   Ht 6\' 2"  (1.88 m)   Wt 277 lb 12.8 oz (126 kg)   SpO2 99%   BMI 35.67 kg/m    Physical Exam Vitals and nursing note reviewed.  Constitutional:      General: He is not in acute  distress.    Appearance: Normal appearance. He is not ill-appearing.  HENT:     Head: Normocephalic and atraumatic.  Pulmonary:     Effort: Pulmonary effort is normal.  Skin:    General: Skin is warm and dry.  Neurological:     Mental Status: He is alert and oriented to person, place, and time. Mental status is at baseline.  Psychiatric:        Mood and Affect: Mood normal.        Behavior: Behavior normal.        Thought Content: Thought content normal.        Judgment: Judgment normal.       Results:   Studies obtained and personally reviewed by me:   Labs:       Component Value Date/Time   NA 140 07/10/2022 0907    K 4.2 07/10/2022 0907   CL 103 07/10/2022 0907   CO2 29 07/10/2022 0907   GLUCOSE 168 (H) 07/10/2022 0907   GLUCOSE 97 04/30/2006 1334   BUN 13 07/10/2022 0907   CREATININE 0.75 07/10/2022 0907   CALCIUM 9.1 07/10/2022 0907   PROT 6.8 07/10/2022 0907   ALBUMIN 4.3 05/25/2016 1121   AST 13 07/10/2022 0907   ALT 12 07/10/2022 0907   ALKPHOS 63 05/25/2016 1121   BILITOT 0.5 07/10/2022 0907   GFRNONAA >60 09/26/2021 1339   GFRNONAA 91 07/05/2020 1140   GFRAA 106 07/05/2020 1140     Lab Results  Component Value Date   WBC 3.7 (L) 07/10/2022   HGB 15.4 07/10/2022   HCT 44.8 07/10/2022   MCV 83.6 07/10/2022   PLT 215 07/10/2022    Lab Results  Component Value Date   CHOL 152 07/10/2022   HDL 43 07/10/2022   LDLCALC 92 07/10/2022   TRIG 81 07/10/2022   CHOLHDL 3.5 07/10/2022    Lab Results  Component Value Date   HGBA1C 7.2 (H) 08/28/2022     No results found for: "TSH"   Lab Results  Component Value Date   PSA 0.31 07/10/2022   PSA 0.36 07/11/2021   PSA 0.54 07/05/2020      Assessment & Plan:   Musculoskeletal pain: He plans to consult his orthopedist (Dr. Onnie Graham) if right shoulder pain continues. May benefit from PT or steroid injection. This would certainly interfere with his job as an Clinical biochemist at [employer].   Type 2 diabetes mellitus: treated with Glucotrol XL 10 mg daily with breakfast, Mounjaro 2.5 mg once weekly. HGBA1C at 7.2% on 08/28/22. Has follow up with Endocrinologist in early June  Hypertension: treated with HCTZ 25 mg daily, Cozaar 100 mg daily, Toprol-XL 25 mg daily. BP stable at 122/70 today. Continue to monitor BP at home.  Cervical spondylosis with radiculopathy follow ed by Dr. Reatha Armour, Neurosugeon- continued neck stiffness and decreased range of motion.  Anxiety state: still struggling with desire to work but knows he cannot do his job as an Clinical biochemist with neck and shoulder issues.Does not want to see counselor at this time.  CPE due in  February. Will see Dr. Kelton Pillar, Endocrinologist for follow up in early June.  I,Alexander Ruley,acting as a Education administrator for Elby Showers, MD.,have documented all relevant documentation on the behalf of Elby Showers, MD,as directed by  Elby Showers, MD while in the presence of Elby Showers, MD.   I, Elby Showers, MD, have reviewed all documentation for this visit. The documentation on 09/05/22 for the exam, diagnosis,  procedures, and orders are all accurate and complete.

## 2022-08-31 ENCOUNTER — Ambulatory Visit: Payer: BC Managed Care – PPO | Admitting: Internal Medicine

## 2022-08-31 ENCOUNTER — Encounter: Payer: Self-pay | Admitting: Internal Medicine

## 2022-08-31 VITALS — BP 124/70 | HR 100 | Ht 74.0 in | Wt 278.0 lb

## 2022-08-31 DIAGNOSIS — E1165 Type 2 diabetes mellitus with hyperglycemia: Secondary | ICD-10-CM

## 2022-08-31 MED ORDER — TIRZEPATIDE 2.5 MG/0.5ML ~~LOC~~ SOAJ: 2.5000 mg | SUBCUTANEOUS | 6 refills | Status: DC

## 2022-08-31 MED ORDER — GLIPIZIDE ER 10 MG PO TB24: 10.0000 mg | ORAL_TABLET | Freq: Every day | ORAL | 2 refills | Status: DC

## 2022-08-31 NOTE — Patient Instructions (Signed)
Start Mounjaro 2.5 mg once weekly  Continue Glipizide 10 mg XL , 1 tablet before Breakfast  Stop Metformin  Stop Januvia     HOW TO TREAT LOW BLOOD SUGARS (Blood sugar LESS THAN 70 MG/DL) Please follow the RULE OF 15 for the treatment of hypoglycemia treatment (when your (blood sugars are less than 70 mg/dL)   STEP 1: Take 15 grams of carbohydrates when your blood sugar is low, which includes:  3-4 GLUCOSE TABS  OR 3-4 OZ OF JUICE OR REGULAR SODA OR ONE TUBE OF GLUCOSE GEL    STEP 2: RECHECK blood sugar in 15 MINUTES STEP 3: If your blood sugar is still low at the 15 minute recheck --> then, go back to STEP 1 and treat AGAIN with another 15 grams of carbohydrates.

## 2022-08-31 NOTE — Progress Notes (Signed)
Name: Chad Gardner  MRN/ DOB: [id number], March 11, 1966   Age/ Sex: 57 y.o., male    PCP: Elby Showers, MD   Reason for Endocrinology Evaluation: Type 2 Diabetes Mellitus     Date of Initial Endocrinology Visit: 08/31/2022     PATIENT IDENTIFIER: Chad Gardner is a 57 y.o. male with a past medical history of DM, and HTN. The patient presented for initial endocrinology clinic visit on 08/31/2022 for consultative assistance with his diabetes management.    HPI: Chad Gardner was    Diagnosed with DM 2015 Prior Medications tried/Intolerance: as listed  Currently checking blood sugars 1 x / day  Hypoglycemia episodes : no                Hemoglobin A1c has ranged from 6.3% in 2022, peaking at 8.4% in 2021.  In terms of diet, the patient avoids sugar-sweetened beverages , eats 3 meals a day, snacks occasionally at night   He had spinal sx 10/2021 and that's when he was noted with worsening glycemic control.    When asked if he was exercising, patient mentions he is undergoing PT  He has not been able to go back to work due to musculoskeletal issues, he is an Clinical biochemist by trade  Metformin has been causing some stomach issues and bowel changes  Denies nausea or vomiting  No pancreatitis  After talking to multiple people, he is concerned about metformin and is interested in "once weekly injections"  HOME DIABETES REGIMEN: Metformin 500 mg, 2 tabs  daily  Januvia 100 mg daily  Glipizide 10 mg daily       Statin: no ACE-I/ARB: yes Prior Diabetic Education: no   METER DOWNLOAD SUMMARY: no   DIABETIC COMPLICATIONS: Microvascular complications:   Denies: CKD, neuropathy Last eye exam: Completed 06/2022  Macrovascular complications:   Denies: CAD, PVD, CVA   PAST HISTORY: Past Medical History:  Past Medical History:  Diagnosis Date   Arthritis    Diabetes mellitus without complication ([place])    Hypertension    Overactive bladder    Tendonitis of wrist, left     Past Surgical History:  Past Surgical History:  Procedure Laterality Date   BACK SURGERY     ruptured disc   SHOULDER ARTHROSCOPY Left 09/24/2019   TENDON RELEASE  06/05/2009   Lt wrist   TRANSFORAMINAL LUMBAR INTERBODY FUSION W/ MIS 1 LEVEL Right 10/04/2021   Procedure: MINIMALLY INVASIVE DECOMPRESSION AND TRANSFORAMINAL LUMBAR INTERBODY FUSION LUMBAR FIVE-SACRAL ONE, RIGHT SIDED APPROACH;  Surgeon: Karsten Ro, DO;  Location: [facility];  Service: Neurosurgery;  Laterality: Right;  3C    Social History:  reports that he has never smoked. He has never used smokeless tobacco. He reports current alcohol use. He reports that he does not use drugs. Family History:  Family History  Problem Relation Age of Onset   Hypertension Mother    Stroke Mother    Cancer Father    Hypertension Brother      HOME MEDICATIONS: Allergies as of 08/31/2022   No Known Allergies      Medication List        Accurate as of August 31, 2022 12:19 PM. If you have any questions, ask your nurse or doctor.          Accu-Chek Aviva Plus test strip Generic drug: glucose blood USE ONE STRIP TO CHECK GLUCOSE TWICE DAILY   Accu-Chek Aviva Plus w/Device Kit   Accu-Chek Softclix Lancets lancets Use as instructed  twice daily   Gemtesa 75 MG Tabs Generic drug: Vibegron Take 75 mg by mouth daily.   glipiZIDE 10 MG 24 hr tablet Commonly known as: GLUCOTROL XL Take 1 tablet by mouth once daily with breakfast   hydrochlorothiazide 25 MG tablet Commonly known as: HYDRODIURIL Take 1 tablet by mouth once daily   Januvia 100 MG tablet Generic drug: sitaGLIPtin Take 1 tablet by mouth once daily   losartan 100 MG tablet Commonly known as: COZAAR Take 1 tablet by mouth once daily   metFORMIN 500 MG tablet Commonly known as: GLUCOPHAGE TAKE 1 TABLET BY MOUTH ONCE DAILY WITH SUPPER What changed:  how much to take how to take this when to take this   metoprolol succinate 25 MG 24 hr  tablet Commonly known as: TOPROL-XL Take 1 tablet by mouth once daily   multivitamin with minerals Tabs tablet Take 1 tablet by mouth daily.   TURMERIC PO Take 1 capsule by mouth daily.         ALLERGIES: No Known Allergies   REVIEW OF SYSTEMS: A comprehensive ROS was conducted with the patient and is negative except as per HPI and below:  ROS    OBJECTIVE:   VITAL SIGNS: BP 124/70 (BP Location: Left Arm, Patient Position: Sitting, Cuff Size: Large)   Pulse 100   Ht 6\' 2"  (1.88 m)   Wt 278 lb (126.1 kg)   SpO2 96%   BMI 35.69 kg/m    PHYSICAL EXAM:  General: Pt appears well and is in NAD  Neck: General: Supple without adenopathy or carotid bruits. Thyroid: Thyroid size normal.  No goiter or nodules appreciated.   Lungs: Clear with good BS bilat with no rales, rhonchi, or wheezes  Heart: RRR   Abdomen:  soft, nontender  Extremities:  Lower extremities - No pretibial edema.  Neuro: MS is good with appropriate affect, pt is alert and Ox3       DATA REVIEWED:  Lab Results  Component Value Date   HGBA1C 7.2 (H) 08/28/2022   HGBA1C 8.0 (H) 07/10/2022   HGBA1C 6.8 (H) 02/21/2022    Latest Reference Range & Units 07/10/22 09:07  Sodium 135 - 146 mmol/L 140  Potassium 3.5 - 5.3 mmol/L 4.2  Chloride 98 - 110 mmol/L 103  CO2 20 - 32 mmol/L 29  Glucose 65 - 99 mg/dL 168 (H)  Mean Plasma Glucose mg/dL 183  BUN 7 - 25 mg/dL 13  Creatinine 0.70 - 1.30 mg/dL 0.75  Calcium 8.6 - 10.3 mg/dL 9.1  BUN/Creatinine Ratio 6 - 22 (calc) SEE NOTE:  eGFR > OR = 60 mL/min/1.70m2 105  AG Ratio 1.0 - 2.5 (calc) 1.5  AST 10 - 35 U/L 13  ALT 9 - 46 U/L 12  Total Protein 6.1 - 8.1 g/dL 6.8  Total Bilirubin 0.2 - 1.2 mg/dL 0.5    Latest Reference Range & Units 07/10/22 09:07  Total CHOL/HDL Ratio <5.0 (calc) 3.5  Cholesterol <200 mg/dL 152  HDL Cholesterol > OR = 40 mg/dL 43  LDL Cholesterol (Calc) mg/dL (calc) 92  MICROALB/CREAT RATIO <30 mcg/mg creat 1  Non-HDL  Cholesterol (Calc) <130 mg/dL (calc) 109  Triglycerides <150 mg/dL 81    Old records , labs and images have been reviewed.   ASSESSMENT / PLAN / RECOMMENDATIONS:   1) Type 2 Diabetes Mellitus, Sub-Optimally controlled, Without complications - Most recent A1c of 7.2 %. Goal A1c < 7.0 %.    Plan: GENERAL: I have discussed with the  patient the pathophysiology of diabetes. We went over the natural progression of the disease. We stressed the importance of lifestyle changes. I explained the complications associated with diabetes including retinopathy, nephropathy, neuropathy as well as increased risk of cardiovascular disease. We went over the benefit seen with glycemic control.   I explained to the patient that diabetic patients are at higher than normal risk for amputations.  Patient is concerned about metformin causing kidney damage after talking to multiple people, I did explain to the patient that uncontrolled diabetes is the main reason for renal deterioration, I did also explain that once renal deterioration occurs, then we adjust the metformin accordingly due to renal clearance, I did offer to switch regular metformin to XR formulation for better tolerance but he really wants to be off metformin Patient under the impression that once weekly injections will replace the metformin, I did explain to the patient that GLP-1 agonist would replace the Januvia, I am concerned that with stopping the Januvia and the metformin at the same time his glucose control may worsen I also offered SGLT2 inhibitors Patient will remain on glipizide at this time I did encourage glucose checks at home, and to notify us with worsening glucose control  MEDICATIONS: Start Mounjaro 2.5 mg once weekly Continue glipizide 10 mg XL, 1 tablet before breakfast Stop metformin Stop Januvia  EDUCATION / INSTRUCTIONS: BG monitoring instructions: Patient is instructed to check his blood sugars 1 times a day, fasting. Call  [facility] if: BG persistently < 70  I reviewed the Rule of 15 for the treatment of hypoglycemia in detail with the patient. Literature supplied.   2) Diabetic complications:  Eye: Does not have known diabetic retinopathy.  Neuro/ Feet: Does not have known diabetic peripheral neuropathy. Renal: Patient does not have known baseline CKD. He is  on an ACEI/ARB at present.   Follow-up in 3 months  Signed electronically by: Mack Guise, MD  [facility]  [address] [place], [place] [address] Phone: [phone] FAX: [phone]   CC: Elby Showers, MD [address] [id number] Phone: [phone]  Fax: [phone]    Return to Endocrinology clinic as below: Future Appointments  Date Time Provider [place]  09/05/2022 10:30 AM Baxley, Cresenciano Lick, MD [id number] MJB

## 2022-09-05 ENCOUNTER — Encounter: Payer: Self-pay | Admitting: Internal Medicine

## 2022-09-05 ENCOUNTER — Ambulatory Visit: Payer: BC Managed Care – PPO | Admitting: Internal Medicine

## 2022-09-05 VITALS — BP 122/70 | HR 69 | Temp 97.9°F | Ht 74.0 in | Wt 277.8 lb

## 2022-09-05 DIAGNOSIS — M25512 Pain in left shoulder: Secondary | ICD-10-CM | POA: Diagnosis not present

## 2022-09-05 DIAGNOSIS — I10 Essential (primary) hypertension: Secondary | ICD-10-CM

## 2022-09-05 DIAGNOSIS — M19012 Primary osteoarthritis, left shoulder: Secondary | ICD-10-CM

## 2022-09-05 DIAGNOSIS — Z9889 Other specified postprocedural states: Secondary | ICD-10-CM | POA: Diagnosis not present

## 2022-09-05 DIAGNOSIS — M47812 Spondylosis without myelopathy or radiculopathy, cervical region: Secondary | ICD-10-CM

## 2022-09-05 DIAGNOSIS — M4722 Other spondylosis with radiculopathy, cervical region: Secondary | ICD-10-CM

## 2022-09-05 DIAGNOSIS — E119 Type 2 diabetes mellitus without complications: Secondary | ICD-10-CM

## 2022-09-05 DIAGNOSIS — G8929 Other chronic pain: Secondary | ICD-10-CM

## 2022-09-05 NOTE — Patient Instructions (Signed)
Pleased with Hgb AIC improvement on Mounjaro. CPE due here in February. Continue antihypertensive medication.

## 2022-09-19 ENCOUNTER — Other Ambulatory Visit: Payer: Self-pay | Admitting: Internal Medicine

## 2022-10-06 ENCOUNTER — Ambulatory Visit: Payer: BC Managed Care – PPO | Admitting: Internal Medicine

## 2022-10-25 ENCOUNTER — Other Ambulatory Visit: Payer: Self-pay | Admitting: Internal Medicine

## 2022-11-10 ENCOUNTER — Encounter: Payer: Self-pay | Admitting: Internal Medicine

## 2022-11-10 ENCOUNTER — Ambulatory Visit: Payer: BC Managed Care – PPO | Admitting: Internal Medicine

## 2022-11-10 VITALS — BP 128/70 | HR 66 | Ht 74.0 in | Wt 272.2 lb

## 2022-11-10 DIAGNOSIS — E119 Type 2 diabetes mellitus without complications: Secondary | ICD-10-CM | POA: Diagnosis not present

## 2022-11-10 DIAGNOSIS — Z7985 Long-term (current) use of injectable non-insulin antidiabetic drugs: Secondary | ICD-10-CM | POA: Diagnosis not present

## 2022-11-10 DIAGNOSIS — Z7984 Long term (current) use of oral hypoglycemic drugs: Secondary | ICD-10-CM

## 2022-11-10 LAB — POCT GLYCOSYLATED HEMOGLOBIN (HGB A1C): Hemoglobin A1C: 6.4 % — AB (ref 4.0–5.6)

## 2022-11-10 MED ORDER — GLIPIZIDE 5 MG PO TABS: 5.0000 mg | ORAL_TABLET | Freq: Every day | ORAL | 3 refills | Status: DC

## 2022-11-10 MED ORDER — TIRZEPATIDE 5 MG/0.5ML ~~LOC~~ SOAJ: 5.0000 mg | SUBCUTANEOUS | 3 refills | Status: DC

## 2022-11-10 NOTE — Progress Notes (Signed)
Name: Chad Gardner  MRN/ DOB: [id number], [birth year]   Age/ Sex: 57 y.o., male    PCP: Margaree Mackintosh, MD   Reason for Endocrinology Evaluation: Type 2 Diabetes Mellitus     Date of Initial Endocrinology Visit: 08/31/2022    PATIENT IDENTIFIER: Chad Gardner is a 57 y.o. male with a past medical history of DM, and HTN. The patient presented for initial endocrinology clinic visit on 08/31/2022 for consultative assistance with his diabetes management.    HPI: Chad Gardner was    Diagnosed with DM 2015           Hemoglobin A1c has ranged from 6.3% in 2022, peaking at 8.4% in 2021.   On his initial visit to our clinic he had an A1c of 7.2%. Metformin has been causing some stomach issues and bowel changes , so we stopped it After talking to multiple people, he is concerned about metformin and is interested in "once weekly injections", so he was started on Mounjaro, I will stop Januvia  SUBJECTIVE:   During the last visit (08/31/2022): A1c 7.2%  Today (11/10/22): Chad Gardner is here for follow-up on diabetes management.  He checks his blood sugars occasionally. The patient has not had hypoglycemic episodes since the last clinic visit. The patient is not symptomatic with these episodes.   Patient has been noted with weight loss Denies nausea, vomiting  Denies constipation or diarrhea   Continues with PT    HOME DIABETES REGIMEN: Mounjaro 2.5 mg weekly Glipizide 10 mg XL daily       Statin: no ACE-I/ARB: yes Prior Diabetic Education: no   METER DOWNLOAD SUMMARY: Unable to download 122- 194 mg/dL    DIABETIC COMPLICATIONS: Microvascular complications:   Denies: CKD, neuropathy Last eye exam: Completed 06/09/2022  Macrovascular complications:   Denies: CAD, PVD, CVA   PAST HISTORY: Past Medical History:  Past Medical History:  Diagnosis Date   Arthritis    Diabetes mellitus without complication ([facility])    Hypertension    Overactive bladder    Tendonitis  of wrist, left    Past Surgical History:  Past Surgical History:  Procedure Laterality Date   BACK SURGERY     ruptured disc   SHOULDER ARTHROSCOPY Left 09/24/2019   TENDON RELEASE  06/05/2009   Lt wrist   TRANSFORAMINAL LUMBAR INTERBODY FUSION W/ MIS 1 LEVEL Right 10/04/2021   Procedure: MINIMALLY INVASIVE DECOMPRESSION AND TRANSFORAMINAL LUMBAR INTERBODY FUSION LUMBAR FIVE-SACRAL ONE, RIGHT SIDED APPROACH;  Surgeon: Bethann Goo, DO;  Location[facility];  Service: Neurosurgery;  Laterality: Right;  3C    Social History:  reports that he has never smoked. He has never used smokeless tobacco. He reports current alcohol use. He reports that he does not use drugs. Family History:  Family History  Problem Relation Age of Onset   Hypertension Mother    Stroke Mother    Cancer Father    Hypertension Brother      HOME MEDICATIONS: Allergies as of 11/10/2022   No Known Allergies      Medication List        Accurate as of November 10, 2022  8:41 AM. If you have any questions, ask your nurse or doctor.          STOP taking these medications    glipiZIDE 10 MG 24 hr tablet Commonly known as: GLUCOTROL XL Replaced by: glipiZIDE 5 MG tablet Stopped by: Scarlette Shorts, MD   tirzepatide 2.5 MG/0.5ML Pen Commonly known  as: MOUNJARO Replaced by: tirzepatide 5 MG/0.5ML Pen Stopped by: Scarlette Shorts, MD       TAKE these medications    Accu-Chek Aviva Plus test strip Generic drug: glucose blood USE ONE STRIP TO CHECK GLUCOSE TWICE DAILY   Accu-Chek Aviva Plus w/Device Kit   Accu-Chek Softclix Lancets lancets Use as instructed twice daily   Gemtesa 75 MG Tabs Generic drug: Vibegron Take 75 mg by mouth daily.   glipiZIDE 5 MG tablet Commonly known as: GLUCOTROL Take 1 tablet (5 mg total) by mouth daily before breakfast. Replaces: glipiZIDE 10 MG 24 hr tablet Started by: Scarlette Shorts, MD   hydrochlorothiazide 25 MG tablet Commonly known as:  HYDRODIURIL Take 1 tablet by mouth once daily   losartan 100 MG tablet Commonly known as: COZAAR Take 1 tablet by mouth once daily   metoprolol succinate 25 MG 24 hr tablet Commonly known as: TOPROL-XL Take 1 tablet by mouth once daily   multivitamin with minerals Tabs tablet Take 1 tablet by mouth daily.   tirzepatide 5 MG/0.5ML Pen Commonly known as: MOUNJARO Inject 5 mg into the skin once a week. Replaces: tirzepatide 2.5 MG/0.5ML Pen Started by: Scarlette Shorts, MD   TURMERIC PO Take 1 capsule by mouth daily.         ALLERGIES: No Known Allergies   REVIEW OF SYSTEMS: A comprehensive ROS was conducted with the patient and is negative except as per HPI    OBJECTIVE:   VITAL SIGNS: BP 128/70   Pulse 66   Ht 6\' 2"  (1.88 m)   Wt 272 lb 3.2 oz (123.5 kg)   SpO2 97%   BMI 34.95 kg/m    PHYSICAL EXAM:  General: Pt appears well and is in NAD  Lungs: Clear with good BS bilat   Heart: RRR   Abdomen:  soft, nontender  Extremities:  Lower extremities - No pretibial edema.  Neuro: MS is good with appropriate affect, pt is alert and Ox3   Dm Foot Exam 11/10/2022  The skin of the feet is intact without sores or ulcerations. The pedal pulses are 2+ on right and 2+ on left. The sensation is intact to a screening 5.07, 10 gram monofilament bilaterally     DATA REVIEWED:  Lab Results  Component Value Date   HGBA1C 6.4 (A) 11/10/2022   HGBA1C 7.2 (H) 08/28/2022   HGBA1C 8.0 (H) 07/10/2022    Latest Reference Range & Units 07/10/22 09:07  Sodium 135 - 146 mmol/L 140  Potassium 3.5 - 5.3 mmol/L 4.2  Chloride 98 - 110 mmol/L 103  CO2 20 - 32 mmol/L 29  Glucose 65 - 99 mg/dL 161 (H)  Mean Plasma Glucose mg/dL 096  BUN 7 - 25 mg/dL 13  Creatinine 0.45 - 4.09 mg/dL 8.11  Calcium 8.6 - 91.4 mg/dL 9.1  BUN/Creatinine Ratio 6 - 22 (calc) SEE NOTE:  eGFR > OR = 60 mL/min/1.14m2 105  AG Ratio 1.0 - 2.5 (calc) 1.5  AST 10 - 35 U/L 13  ALT 9 - 46 U/L 12   Total Protein 6.1 - 8.1 g/dL 6.8  Total Bilirubin 0.2 - 1.2 mg/dL 0.5    Latest Reference Range & Units 07/10/22 09:07  Total CHOL/HDL Ratio <5.0 (calc) 3.5  Cholesterol <200 mg/dL 782  HDL Cholesterol > OR = 40 mg/dL 43  LDL Cholesterol (Calc) mg/dL (calc) 92  MICROALB/CREAT RATIO <30 mcg/mg creat 1  Non-HDL Cholesterol (Calc) <130 mg/dL (calc) 956  Triglycerides <150 mg/dL  81     ASSESSMENT / PLAN / RECOMMENDATIONS:   1) Type 2 Diabetes Mellitus,Optimally controlled, Without complications - Most recent A1c of 6.4 %. Goal A1c < 7.0 %.     -I have placed the patient on optimizing glucose control -We stopped metformin due to GI side effects  -I have recommended increasing Mounjaro and decreasing glipizide -Discussed the risk of hypoglycemia with glipizide and encouraged the patient to contact us with hypoglycemia  MEDICATIONS: Increase Mounjaro 5 mg once weekly Stop glipizide 10 mg XL, 1 tablet before breakfast Start glipizide 5 mg daily  EDUCATION / INSTRUCTIONS: BG monitoring instructions: Patient is instructed to check his blood sugars 1 times a day, fasting. Call [facility] if: BG persistently < 70  I reviewed the Rule of 15 for the treatment of hypoglycemia in detail with the patient. Literature supplied.   2) Diabetic complications:  Eye: Does not have known diabetic retinopathy.  Neuro/ Feet: Does not have known diabetic peripheral neuropathy. Renal: Patient does not have known baseline CKD. He is  on an ACEI/ARB at present.   Follow-up in 6 months  Signed electronically by: Lyndle Herrlich, MD  [facility]  [facility] [address] [place], [place] [address] Phone: [phone] FAX: [phone]   CC: Margaree Mackintosh, MD [address] [place] [address] Phone: [phone]  Fax: [phone]    Return to Endocrinology clinic as below: No future appointments.

## 2022-11-10 NOTE — Patient Instructions (Signed)
Increase Mounjaro 5 mg once weekly  Decrease glipizide 5 mg , 1 tablet before Breakfast      HOW TO TREAT LOW BLOOD SUGARS (Blood sugar LESS THAN 70 MG/DL) Please follow the RULE OF 15 for the treatment of hypoglycemia treatment (when your (blood sugars are less than 70 mg/dL)   STEP 1: Take 15 grams of carbohydrates when your blood sugar is low, which includes:  3-4 GLUCOSE TABS  OR 3-4 OZ OF JUICE OR REGULAR SODA OR ONE TUBE OF GLUCOSE GEL    STEP 2: RECHECK blood sugar in 15 MINUTES STEP 3: If your blood sugar is still low at the 15 minute recheck --> then, go back to STEP 1 and treat AGAIN with another 15 grams of carbohydrates.

## 2022-11-24 ENCOUNTER — Telehealth: Payer: Self-pay | Admitting: Internal Medicine

## 2022-11-24 NOTE — Telephone Encounter (Signed)
Called Kishon to schedule an appointment and he is going out of town, he will call and schedule an appointment when he gets back

## 2022-11-24 NOTE — Telephone Encounter (Signed)
Chad Gardner [phone]  Chad Gardner called to say he needs to get some pain medicine, that Dr Dawley said he could not write it any more, he would need to get from PCP or go to pain management. He does not see him again for 6 months. Chad Gardner is in limbo waiting on disability to come thru. I let him know you do not write for pain medication on a regular basis, and he would also need an appointment, you do not just write a prescription for it without seeing someone. He has about 8 pills right now.

## 2022-12-15 ENCOUNTER — Other Ambulatory Visit: Payer: Self-pay | Admitting: Internal Medicine

## 2023-01-22 ENCOUNTER — Other Ambulatory Visit: Payer: Self-pay | Admitting: Internal Medicine

## 2023-02-07 ENCOUNTER — Other Ambulatory Visit (HOSPITAL_COMMUNITY): Payer: Self-pay

## 2023-02-07 ENCOUNTER — Telehealth: Payer: Self-pay

## 2023-02-07 NOTE — Telephone Encounter (Signed)
Pharmacy Patient Advocate Encounter   Received notification from Pt Calls Messages that prior authorization for Greggory Keen is required/requested.   Insurance verification completed.   The patient is insured through [employer] [facility] [facility] [facility] .   Per test claim: PA required; PA submitted to [employer] [facility] via CoverMyMeds Key/confirmation #/EOC [id number] Status is pending

## 2023-02-07 NOTE — Telephone Encounter (Signed)
Mounjaro needs a PA. 

## 2023-02-12 ENCOUNTER — Other Ambulatory Visit (HOSPITAL_COMMUNITY): Payer: Self-pay

## 2023-02-12 NOTE — Telephone Encounter (Signed)
Pharmacy Patient Advocate Encounter  Received notification from [employer] [facility] that Prior Authorization for [facility] 5MG /0.5ML pen-injectors has been APPROVED from 02-07-2023 to 02-06-2026. Insurance pays MAX 28 day supply   PA #/Case ID/Reference #: [id number]

## 2023-02-19 ENCOUNTER — Encounter: Payer: Self-pay | Admitting: Internal Medicine

## 2023-02-19 ENCOUNTER — Ambulatory Visit: Payer: BC Managed Care – PPO | Admitting: Internal Medicine

## 2023-02-19 VITALS — BP 110/80 | HR 80 | Ht 74.0 in | Wt 273.0 lb

## 2023-02-19 DIAGNOSIS — M25511 Pain in right shoulder: Secondary | ICD-10-CM

## 2023-02-19 DIAGNOSIS — M542 Cervicalgia: Secondary | ICD-10-CM

## 2023-02-19 DIAGNOSIS — I10 Essential (primary) hypertension: Secondary | ICD-10-CM

## 2023-02-19 DIAGNOSIS — E119 Type 2 diabetes mellitus without complications: Secondary | ICD-10-CM | POA: Diagnosis not present

## 2023-02-19 DIAGNOSIS — Z9889 Other specified postprocedural states: Secondary | ICD-10-CM

## 2023-02-19 DIAGNOSIS — M5416 Radiculopathy, lumbar region: Secondary | ICD-10-CM

## 2023-02-19 DIAGNOSIS — M47812 Spondylosis without myelopathy or radiculopathy, cervical region: Secondary | ICD-10-CM | POA: Diagnosis not present

## 2023-02-19 DIAGNOSIS — G8929 Other chronic pain: Secondary | ICD-10-CM

## 2023-02-19 DIAGNOSIS — M19012 Primary osteoarthritis, left shoulder: Secondary | ICD-10-CM

## 2023-02-19 DIAGNOSIS — M25512 Pain in left shoulder: Secondary | ICD-10-CM

## 2023-02-19 MED ORDER — OXYCODONE-ACETAMINOPHEN 5-325 MG PO TABS: 1.0000 | ORAL_TABLET | Freq: Three times a day (TID) | ORAL | 0 refills | Status: AC | PRN

## 2023-02-19 NOTE — Patient Instructions (Signed)
Have refilled oxycodone 10/325 #15 tabs Due for CPE in 6 months. Will continue with Endocrinology management of diabetes. We discussed chronic pain management if needed.

## 2023-02-19 NOTE — Progress Notes (Signed)
Patient Care Team: Margaree Mackintosh, MD as PCP - General (Internal Medicine)  Visit Date: 02/19/23  Subjective:    Patient ID: Chad Gardner , Male   DOB: 1966-03-16, 57 y.o.    MRN: [id number]   56 y.o. Male presents today for a 6 month follow-up. History of chronic neck pain, osteoarthritis, Type 2 diabetes mellitus, and essential hypertension.  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  He experiences neck pain with certain motions.  Recently seen at [facility] and had an injection in right shoulder. He is still having pain. Status post left acromial repair. He is also having neck pain. Taking oxycodone-acetaminophen 5-325 mg as needed.  History of Type 2 diabetes mellitus treated with Mounjaro 5 mg weekly, glipizide 5 mg daily. Point of care HGBA1c at 6.4% on 11/10/22, down from 7.2% on 08/28/22. Seen by Endocrinologist, Dr. Lonzo Cloud.  History of hypertension treated with hydrochlorothiazide 25 mg daily, losartan 100 mg daily, metoprolol succinate 25 mg daily. Blood pressure normal today at 110/80.  Healthcare maintenance-due for colonoscopy 2026  Past Medical History:  Diagnosis Date   Arthritis    Diabetes mellitus without complication (HCC)    Hypertension    Overactive bladder    Tendonitis of wrist, left      Family History  Problem Relation Age of Onset   Hypertension Mother    Stroke Mother    Cancer Father    Hypertension Brother    Social history: He previously was employed as an Personnel officer at [employer].  Due to ongoing medical issues with musculoskeletal pain and neck and shoulder he has elected to retire.  He is pleased with his decision.  Seems to be doing better with chronic pain.  He has a son who is an Airline pilot and lives in [place].  Patient does not smoke.  Occasionally drinks beer socially when watching sports.  Separated from wife for a number of years now.  Resides alone.  He is a Multimedia programmer.      Review of Systems  Constitutional:  Negative for fever and malaise/fatigue.  HENT:  Negative for congestion.   Eyes:  Negative for blurred vision.  Respiratory:  Negative for cough and shortness of breath.   Cardiovascular:  Negative for chest pain, palpitations and leg swelling.  Gastrointestinal:  Negative for vomiting.  Musculoskeletal:  Positive for back pain, joint pain (Right shoulder) and neck pain.  Skin:  Negative for rash.  Neurological:  Negative for loss of consciousness and headaches.        Objective:   Vitals: BP 110/80   Pulse 80   Ht 6\' 2"  (1.88 m)   Wt 273 lb (123.8 kg)   SpO2 98%   BMI 35.05 kg/m    Physical Exam Constitutional:      General: He is not in acute distress.    Appearance: Normal appearance. He is not ill-appearing.  HENT:     Head: Normocephalic and atraumatic.  Neck:     Vascular: No carotid bruit.  Cardiovascular:     Rate and Rhythm: Normal rate and regular rhythm.     Pulses: Normal pulses.     Heart sounds: Normal heart sounds. No murmur heard.    No friction rub. No gallop.  Pulmonary:     Effort: Pulmonary effort is normal. No respiratory distress.     Breath sounds: Normal breath sounds. No wheezing or rales.  Skin:    General: Skin is warm and  dry.  Neurological:     Mental Status: He is alert and oriented to person, place, and time. Mental status is at baseline.  Psychiatric:        Mood and Affect: Mood normal.        Behavior: Behavior normal.        Thought Content: Thought content normal.        Judgment: Judgment normal.       Results:   Studies obtained and personally reviewed by me:   Labs:       Component Value Date/Time   NA 140 07/10/2022 0907   K 4.2 07/10/2022 0907   CL 103 07/10/2022 0907   CO2 29 07/10/2022 0907   GLUCOSE 168 (H) 07/10/2022 0907   GLUCOSE 97 04/30/2006 1334   BUN 13 07/10/2022 0907   CREATININE 0.75 07/10/2022 0907   CALCIUM 9.1 07/10/2022 0907   PROT  6.8 07/10/2022 0907   ALBUMIN 4.3 05/25/2016 1121   AST 13 07/10/2022 0907   ALT 12 07/10/2022 0907   ALKPHOS 63 05/25/2016 1121   BILITOT 0.5 07/10/2022 0907   GFRNONAA >60 09/26/2021 1339   GFRNONAA 91 07/05/2020 1140   GFRAA 106 07/05/2020 1140     Lab Results  Component Value Date   WBC 3.7 (L) 07/10/2022   HGB 15.4 07/10/2022   HCT 44.8 07/10/2022   MCV 83.6 07/10/2022   PLT 215 07/10/2022    Lab Results  Component Value Date   CHOL 152 07/10/2022   HDL 43 07/10/2022   LDLCALC 92 07/10/2022   TRIG 81 07/10/2022   CHOLHDL 3.5 07/10/2022    Lab Results  Component Value Date   HGBA1C 6.4 (A) 11/10/2022     No results found for: "TSH"   Lab Results  Component Value Date   PSA 0.31 07/10/2022   PSA 0.36 07/11/2021   PSA 0.54 07/05/2020      Assessment & Plan:   Musculoskeletal pain: refilled oxycodone-acetaminophen 5-325 mg every eight hours as needed.  Can only give patient a 5-day course of medication.  He may need to be referred to chronic pain management.  Type 2 diabetes mellitus: treated with Mounjaro 5 mg weekly, glipizide 5 mg daily. Point of care HGBA1c at 6.4% on 11/10/22, down from 7.2% on 08/28/22. Seen by Endocrinologist, Dr. Lonzo Cloud.  Hypertension: treated with hydrochlorothiazide 25 mg daily, losartan 100 mg daily, metoprolol succinate 25 mg daily. Blood pressure normal today at 110/80.  Return in 6 months for health maintenance exam or as needed.    I,Alexander Ruley,acting as a Neurosurgeon for Margaree Mackintosh, MD.,have documented all relevant documentation on the behalf of Margaree Mackintosh, MD,as directed by  Margaree Mackintosh, MD while in the presence of Margaree Mackintosh, MD.   I, Margaree Mackintosh, MD, have reviewed all documentation for this visit. The documentation on 02/28/23 for the exam, diagnosis, procedures, and orders are all accurate and complete.

## 2023-03-08 ENCOUNTER — Other Ambulatory Visit: Payer: Self-pay | Admitting: Internal Medicine

## 2023-05-15 ENCOUNTER — Ambulatory Visit: Payer: BC Managed Care – PPO | Admitting: Internal Medicine

## 2023-05-15 ENCOUNTER — Encounter: Payer: Self-pay | Admitting: Internal Medicine

## 2023-05-15 VITALS — BP 122/80 | HR 70 | Ht 74.0 in | Wt 276.0 lb

## 2023-05-15 DIAGNOSIS — Z7985 Long-term (current) use of injectable non-insulin antidiabetic drugs: Secondary | ICD-10-CM

## 2023-05-15 DIAGNOSIS — E119 Type 2 diabetes mellitus without complications: Secondary | ICD-10-CM

## 2023-05-15 LAB — POCT GLYCOSYLATED HEMOGLOBIN (HGB A1C): Hemoglobin A1C: 6.2 % — AB (ref 4.0–5.6)

## 2023-05-15 MED ORDER — TIRZEPATIDE 7.5 MG/0.5ML ~~LOC~~ SOAJ
7.5000 mg | SUBCUTANEOUS | 3 refills | Status: DC
Start: 2023-05-15 — End: 2024-04-25

## 2023-05-15 NOTE — Patient Instructions (Signed)
Increase Mounjaro 7.5 mg once weekly  STOP  glipizide 5 mg , 1 tablet before Breakfast     HOW TO TREAT LOW BLOOD SUGARS (Blood sugar LESS THAN 70 MG/DL) Please follow the RULE OF 15 for the treatment of hypoglycemia treatment (when your (blood sugars are less than 70 mg/dL)   STEP 1: Take 15 grams of carbohydrates when your blood sugar is low, which includes:  3-4 GLUCOSE TABS  OR 3-4 OZ OF JUICE OR REGULAR SODA OR ONE TUBE OF GLUCOSE GEL    STEP 2: RECHECK blood sugar in 15 MINUTES STEP 3: If your blood sugar is still low at the 15 minute recheck --> then, go back to STEP 1 and treat AGAIN with another 15 grams of carbohydrates.

## 2023-05-15 NOTE — Progress Notes (Signed)
Name: Chad Gardner  MRN/ DOB: [id number], 1965/08/10   Age/ Sex: 57 y.o., male    PCP: Margaree Mackintosh, MD   Reason for Endocrinology Evaluation: Type 2 Diabetes Mellitus     Date of Initial Endocrinology Visit: 08/31/2022    PATIENT IDENTIFIER: Chad Gardner is a 57 y.o. male with a past medical history of DM, and HTN. The patient presented for initial endocrinology clinic visit on 08/31/2022 for consultative assistance with his diabetes management.    HPI: Mr. Chad Gardner was    Diagnosed with DM 2015           Hemoglobin A1c has ranged from 6.3% in 2022, peaking at 8.4% in 2021.   On his initial visit to our clinic he had an A1c of 7.2%. Metformin has been causing some stomach issues and bowel changes , so we stopped it After talking to multiple people, he is concerned about metformin and is interested in "once weekly injections", so he was started on Mounjaro, I  stopped Januvia  SUBJECTIVE:   During the last visit (11/10/2022): A1c 6.4%    Today (05/15/23): [name] is here for follow-up on diabetes management.  He checks his blood sugars occasionally. The patient has not had hypoglycemic episodes since the last clinic visit. The patient is not symptomatic with these episodes.  Has been having right shoulder pain , he continues to be on disability  Has noted weight gain  Denies nausea, vomiting  Denies constipation or diarrhea    HOME DIABETES REGIMEN: Mounjaro 5 mg weekly ( Friday )  Glipizide 5 mg daily      Statin: no ACE-I/ARB: yes Prior Diabetic Education: no   METER DOWNLOAD SUMMARY: Unable to download 120-151 mg/dL    DIABETIC COMPLICATIONS: Microvascular complications:   Denies: CKD, neuropathy Last eye exam: Completed 06/09/2022  Macrovascular complications:   Denies: CAD, PVD, CVA   PAST HISTORY: Past Medical History:  Past Medical History:  Diagnosis Date   Arthritis    Diabetes mellitus without complication (HCC)    Hypertension     Overactive bladder    Tendonitis of wrist, left    Past Surgical History:  Past Surgical History:  Procedure Laterality Date   BACK SURGERY     ruptured disc   SHOULDER ARTHROSCOPY Left 09/24/2019   TENDON RELEASE  06/05/2009   Lt wrist   TRANSFORAMINAL LUMBAR INTERBODY FUSION W/ MIS 1 LEVEL Right 10/04/2021   Procedure: MINIMALLY INVASIVE DECOMPRESSION AND TRANSFORAMINAL LUMBAR INTERBODY FUSION LUMBAR FIVE-SACRAL ONE, RIGHT SIDED APPROACH;  Surgeon: Bethann Goo, DO;  Location: [facility] OR;  Service: Neurosurgery;  Laterality: Right;  3C    Social History:  reports that he has never smoked. He has never used smokeless tobacco. He reports current alcohol use. He reports that he does not use drugs. Family History:  Family History  Problem Relation Age of Onset   Hypertension Mother    Stroke Mother    Cancer Father    Hypertension Brother      HOME MEDICATIONS: Allergies as of 05/15/2023   No Known Allergies      Medication List        Accurate as of May 15, 2023 10:30 AM. If you have any questions, ask your nurse or doctor.          Accu-Chek Aviva Plus test strip Generic drug: glucose blood USE ONE STRIP TO CHECK GLUCOSE TWICE DAILY   Accu-Chek Aviva Plus w/Device Kit   Accu-Chek Softclix Lancets lancets  USE AS DIRECTED   Gemtesa 75 MG Tabs Generic drug: Vibegron Take 75 mg by mouth daily.   glipiZIDE 5 MG tablet Commonly known as: GLUCOTROL Take 1 tablet (5 mg total) by mouth daily before breakfast.   hydrochlorothiazide 25 MG tablet Commonly known as: HYDRODIURIL Take 1 tablet by mouth once daily   losartan 100 MG tablet Commonly known as: COZAAR Take 1 tablet by mouth once daily   meloxicam 15 MG tablet Commonly known as: MOBIC Take 1 tablet every day by oral route, for shoulder inflammation.   metoprolol succinate 25 MG 24 hr tablet Commonly known as: TOPROL-XL Take 1 tablet by mouth once daily   multivitamin with minerals Tabs  tablet Take 1 tablet by mouth daily.   oxyCODONE-acetaminophen 5-325 MG tablet Commonly known as: PERCOCET/ROXICET TAKE 1 TABLET BY MOUTH EVERY 8 HOURS AS NEEDED FOR SEVERE PAIN FOR UP TO 5 DAYS   tirzepatide 5 MG/0.5ML Pen Commonly known as: MOUNJARO Inject 5 mg into the skin once a week.   TURMERIC PO Take 1 capsule by mouth daily.         ALLERGIES: No Known Allergies   REVIEW OF SYSTEMS: A comprehensive ROS was conducted with the patient and is negative except as per HPI    OBJECTIVE:   VITAL SIGNS: BP 122/80 (BP Location: Left Arm, Patient Position: Sitting, Cuff Size: Large)   Pulse 70   Ht 6\' 2"  (1.88 m)   Wt 276 lb (125.2 kg)   SpO2 99%   BMI 35.44 kg/m    PHYSICAL EXAM:  General: Pt appears well and is in NAD  Lungs: Clear with good BS bilat   Heart: RRR   Extremities:  Lower extremities - Trace  pretibial edema.  Neuro: MS is good with appropriate affect, pt is alert and Ox3   Dm Foot Exam 11/10/2022  The skin of the feet is intact without sores or ulcerations. The pedal pulses are 2+ on right and 2+ on left. The sensation is intact to a screening 5.07, 10 gram monofilament bilaterally     DATA REVIEWED:  Lab Results  Component Value Date   HGBA1C 6.2 (A) 05/15/2023   HGBA1C 6.4 (A) 11/10/2022   HGBA1C 7.2 (H) 08/28/2022    Latest Reference Range & Units 07/10/22 09:07  Sodium 135 - 146 mmol/L 140  Potassium 3.5 - 5.3 mmol/L 4.2  Chloride 98 - 110 mmol/L 103  CO2 20 - 32 mmol/L 29  Glucose 65 - 99 mg/dL 244 (H)  Mean Plasma Glucose mg/dL 010  BUN 7 - 25 mg/dL 13  Creatinine 2.72 - 5.36 mg/dL 6.44  Calcium 8.6 - 03.4 mg/dL 9.1  BUN/Creatinine Ratio 6 - 22 (calc) SEE NOTE:  eGFR > OR = 60 mL/min/1.42m2 105  AG Ratio 1.0 - 2.5 (calc) 1.5  AST 10 - 35 U/L 13  ALT 9 - 46 U/L 12  Total Protein 6.1 - 8.1 g/dL 6.8  Total Bilirubin 0.2 - 1.2 mg/dL 0.5    Latest Reference Range & Units 07/10/22 09:07  Total CHOL/HDL Ratio <5.0 (calc) 3.5   Cholesterol <200 mg/dL 742  HDL Cholesterol > OR = 40 mg/dL 43  LDL Cholesterol (Calc) mg/dL (calc) 92  MICROALB/CREAT RATIO <30 mcg/mg creat 1  Non-HDL Cholesterol (Calc) <130 mg/dL (calc) 595  Triglycerides <150 mg/dL 81   Old records , labs and images have been reviewed.    ASSESSMENT / PLAN / RECOMMENDATIONS:   1) Type 2 Diabetes Mellitus,Optimally controlled, Without  complications - Most recent A1c of 6.2 %. Goal A1c < 7.0 %.    -A1c remains optimal -Intolerant to metformin due to GI side effects  -I have recommended increasing Mounjaro and discontinuing glipizide as below   MEDICATIONS: Increase Mounjaro 7.5 mg once weekly Stop glipizide 5 mg daily  EDUCATION / INSTRUCTIONS: BG monitoring instructions: Patient is instructed to check his blood sugars 1 times a day, fasting. Call [facility] if: BG persistently < 70  I reviewed the Rule of 15 for the treatment of hypoglycemia in detail with the patient. Literature supplied.   2) Diabetic complications:  Eye: Does not have known diabetic retinopathy.  Neuro/ Feet: Does not have known diabetic peripheral neuropathy. Renal: Patient does not have known baseline CKD. He is  on an ACEI/ARB at present.   Follow-up in 6 months  I spent 25 minutes preparing to see the patient by review of recent labs, imaging and procedures, obtaining and reviewing separately obtained history, communicating with the patient, ordering medications, tests or procedures, and documenting clinical information in the EHR including the differential Dx, treatment, and any further evaluation and other management   Signed electronically by: Lyndle Herrlich, MD  [facility] Endocrinology  [facility] [address] [place] [place], [place] [address] Phone: [phone] FAX: [phone]   CC: Margaree Mackintosh, MD [address] [place] [address] Phone: [phone]  Fax: [phone]    Return to  Endocrinology clinic as below: Future Appointments  Date Time Provider Department Center  07/31/2023  9:10 AM [facility]  08/03/2023 11:00 AM Baxley, Luanna Cole, MD [facility]

## 2023-06-05 ENCOUNTER — Other Ambulatory Visit: Payer: Self-pay | Admitting: Internal Medicine

## 2023-06-11 ENCOUNTER — Other Ambulatory Visit: Payer: Self-pay | Admitting: Internal Medicine

## 2023-07-07 ENCOUNTER — Ambulatory Visit
Admission: EM | Admit: 2023-07-07 | Discharge: 2023-07-07 | Disposition: A | Discharge status: Home / Self Care | Payer: 59 | Attending: Family Medicine | Admitting: Family Medicine

## 2023-07-07 DIAGNOSIS — I889 Nonspecific lymphadenitis, unspecified: Secondary | ICD-10-CM

## 2023-07-07 DIAGNOSIS — R070 Pain in throat: Secondary | ICD-10-CM | POA: Diagnosis not present

## 2023-07-07 MED ORDER — AMOXICILLIN-POT CLAVULANATE 875-125 MG PO TABS
1.0000 | ORAL_TABLET | Freq: Two times a day (BID) | ORAL | 0 refills | Status: DC
Start: 2023-07-07 — End: 2023-07-16

## 2023-07-07 NOTE — Discharge Instructions (Signed)
Start amoxicillin-clavulanate to help with a suspected infection of the oral cavity or surrounding salivary glands. If there is no improvement and definitely if worse in the next 48 hours, go to the ER.

## 2023-07-07 NOTE — ED Triage Notes (Signed)
Pt reports swelling in left side of the neck x 3 days. States he was told at the pharmacy is a infected gland and needs antibiotic.

## 2023-07-07 NOTE — ED Provider Notes (Signed)
[facility] - URGENT [facility]  Note:  This document was prepared using Conservation officer, historic buildings and may include unintentional dictation errors.  MRN: [id number] DOB: [birth year]  Subjective:   Chad Gardner is a 58 y.o. male presenting for 3-day history of acute onset persistent left-sided neck pain, neck swelling around his jaw.  Has felt feverish.  Goes to the dentist regularly.  No current facility-administered medications for this encounter.  Current Outpatient Medications:    Accu-Chek Softclix Lancets lancets, USE AS DIRECTED, Disp: 100 each, Rfl: 0   Blood Glucose Monitoring Suppl (ACCU-CHEK AVIVA PLUS) W/DEVICE KIT, , Disp: , Rfl:    glucose blood (ACCU-CHEK AVIVA PLUS) test strip, USE ONE STRIP TO CHECK GLUCOSE TWICE DAILY, Disp: 100 each, Rfl: 3   hydrochlorothiazide (HYDRODIURIL) 25 MG tablet, Take 1 tablet by mouth once daily, Disp: 90 tablet, Rfl: 0   losartan (COZAAR) 100 MG tablet, Take 1 tablet by mouth once daily, Disp: 90 tablet, Rfl: 2   meloxicam (MOBIC) 15 MG tablet, Take 1 tablet every day by oral route, for shoulder inflammation., Disp: , Rfl:    metoprolol succinate (TOPROL-XL) 25 MG 24 hr tablet, Take 1 tablet by mouth once daily, Disp: 90 tablet, Rfl: 0   Multiple Vitamin (MULTIVITAMIN WITH MINERALS) TABS tablet, Take 1 tablet by mouth daily., Disp: , Rfl:    oxyCODONE-acetaminophen (PERCOCET/ROXICET) 5-325 MG tablet, TAKE 1 TABLET BY MOUTH EVERY 8 HOURS AS NEEDED FOR SEVERE PAIN FOR UP TO 5 DAYS, Disp: , Rfl:    tirzepatide (MOUNJARO) 7.5 MG/0.5ML Pen, Inject 7.5 mg into the skin once a week., Disp: 6 mL, Rfl: 3   TURMERIC PO, Take 1 capsule by mouth daily., Disp: , Rfl:    Vibegron (GEMTESA) 75 MG TABS, Take 75 mg by mouth daily., Disp: , Rfl:    No Known Allergies  Past Medical History:  Diagnosis Date   Arthritis    Diabetes mellitus without complication (HCC)    Hypertension    Overactive bladder    Tendonitis of wrist, left       Past Surgical History:  Procedure Laterality Date   BACK SURGERY     ruptured disc   SHOULDER ARTHROSCOPY Left 09/24/2019   TENDON RELEASE  06/05/2009   Lt wrist   TRANSFORAMINAL LUMBAR INTERBODY FUSION W/ MIS 1 LEVEL Right 10/04/2021   Procedure: MINIMALLY INVASIVE DECOMPRESSION AND TRANSFORAMINAL LUMBAR INTERBODY FUSION LUMBAR FIVE-SACRAL ONE, RIGHT SIDED APPROACH;  Surgeon: Bethann Goo, DO;  Location: [facility] OR;  Service: Neurosurgery;  Laterality: Right;  3C    Family History  Problem Relation Age of Onset   Hypertension Mother    Stroke Mother    Cancer Father    Hypertension Brother     Social History   Tobacco Use   Smoking status: Never   Smokeless tobacco: Never  Vaping Use   Vaping status: Never Used  Substance Use Topics   Alcohol use: Yes    Comment: occasional    Drug use: Never    ROS   Objective:   Vitals: BP (!) 162/104 (BP Location: Right Arm)   Pulse 74   Temp 99.2 F (37.3 C) (Oral)   Resp 18   SpO2 97%   Physical Exam Constitutional:      General: He is not in acute distress.    Appearance: Normal appearance. He is well-developed and normal weight. He is not ill-appearing, toxic-appearing or diaphoretic.  HENT:     Head: Normocephalic and atraumatic.  Right Ear: External ear normal.     Left Ear: External ear normal.     Nose: Nose normal.     Mouth/Throat:     Pharynx: Oropharynx is clear.  Eyes:     General: No scleral icterus.       Right eye: No discharge.        Left eye: No discharge.     Extraocular Movements: Extraocular movements intact.  Cardiovascular:     Rate and Rhythm: Normal rate.  Pulmonary:     Effort: Pulmonary effort is normal.  Musculoskeletal:     Cervical back: Normal range of motion.  Neurological:     Mental Status: He is alert and oriented to person, place, and time.  Psychiatric:        Mood and Affect: Mood normal.        Behavior: Behavior normal.        Thought Content: Thought content  normal.        Judgment: Judgment normal.      Assessment and Plan :   PDMP not reviewed this encounter.  1. Lymphadenitis   2. Throat pain    Suspect lymphadenitis secondary to undifferentiated bacterial infection.  No signs of sialolithiasis, retropharyngeal abscess, peritonsillar abscess, otitis media.  Low suspicion for necrotic lymph nodes, thyroiditis, epiglottitis.  Recommended Augmentin, supportive care.  Counseled patient on potential for adverse effects with medications prescribed/recommended today, ER and return-to-clinic precautions discussed, patient verbalized understanding.    Wallis Bamberg, [place] 07/07/23 1610

## 2023-07-16 ENCOUNTER — Ambulatory Visit (INDEPENDENT_AMBULATORY_CARE_PROVIDER_SITE_OTHER): Payer: 59 | Admitting: Internal Medicine

## 2023-07-16 ENCOUNTER — Other Ambulatory Visit: Payer: 59

## 2023-07-16 ENCOUNTER — Encounter: Payer: Self-pay | Admitting: Internal Medicine

## 2023-07-16 VITALS — BP 130/80 | HR 70 | Temp 97.8°F | Ht 74.0 in | Wt 276.0 lb

## 2023-07-16 DIAGNOSIS — Z0000 Encounter for general adult medical examination without abnormal findings: Secondary | ICD-10-CM

## 2023-07-16 DIAGNOSIS — R590 Localized enlarged lymph nodes: Secondary | ICD-10-CM

## 2023-07-16 DIAGNOSIS — I10 Essential (primary) hypertension: Secondary | ICD-10-CM

## 2023-07-16 DIAGNOSIS — E119 Type 2 diabetes mellitus without complications: Secondary | ICD-10-CM

## 2023-07-16 DIAGNOSIS — N401 Enlarged prostate with lower urinary tract symptoms: Secondary | ICD-10-CM

## 2023-07-16 DIAGNOSIS — M19012 Primary osteoarthritis, left shoulder: Secondary | ICD-10-CM

## 2023-07-16 DIAGNOSIS — Z13220 Encounter for screening for lipoid disorders: Secondary | ICD-10-CM

## 2023-07-16 MED ORDER — DOXYCYCLINE HYCLATE 100 MG PO TABS
100.0000 mg | ORAL_TABLET | Freq: Two times a day (BID) | ORAL | 0 refills | Status: DC
Start: 2023-07-16 — End: 2023-11-13

## 2023-07-16 NOTE — Progress Notes (Signed)
 Patient Care Team: Sylvan Evener, MD as PCP - General (Internal Medicine)  Visit Date: 07/16/23  Subjective:   Chief Complaint  Patient presents with   left neck pain  Patient ZO:XWRUEA Chad Gardner, Chad Gardner DOB:[birth year],58 y.o. VWU:[id number]   58 y.o. Male presents today for  follow-up from  urgent care visit on 07/07/23 for Lymphadenitis & Throat Pain. From the UC note, at the time his pain had been present for 3-days, and he endorsed persistent left-sided neck pain, submandibular swelling, and fever. Noted that he does have regular dental care. Was prescribed  twice daily 875-125 mg Augmentin  x10 days. It has now been 2 weeks since onset and he still endorses left-sided neck pain.   Today he reports that he initially noticed some spontaneous left-sided neck swelling, that wasn't initially sore, but did not have a cough or known illness at the time. States he still has about 3 more days of antibiotics. Notes the last time he was at his dentist office he was told that all his teeth looked fine, though there may be some issue with his lower gingiva. Also he says there was a popcorn kernel  that staff noted which was stuck in his teeth. He mentions that since then he has been cautious about brushing his teeth so as  to clear all debris.   Discussed his most recent HgbA1c in March 2024, which was 7.2%, improved from 8.0 % . DM  is treated with 7.5 mg Mounjaro  injected weekly.  He also mentions that he has noticed his right shoulder has been bothering him more, affecting his sleep and ROM. He is s/p left shoulder arthroscopy w/ acromial repair since 09/24/19 and recently has established with Ellard Gunning, MD.    Past Medical History:  Diagnosis Date   Arthritis    Diabetes mellitus without complication (HCC)    Hypertension    Overactive bladder    Tendonitis of wrist, left     No Known Allergies  Family History  Problem Relation Age of Onset   Hypertension Mother    Stroke Mother    Cancer  Father    Hypertension Brother    Social History   Social History Narrative   Social history: 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  Resides alone.  Separated from wife.  He has a son working in Audiological scientist in [place] who just graduated with a Event organiser.  Patient does not smoke.  Drinks beer socially when watching sports.  Does not drink any excess and recently has not been drinking at all.       Family history: Father with history of prostate cancer, living.  Mother with history of stroke and hypertension.  They reside here in [place] and he tries to take care of them.  He has 2 brothers.  1 brother alive and well and the other brother has schizophrenia and hypertension.  1 sister overweight.   Review of Systems  Constitutional:  Negative for malaise/fatigue.  Respiratory:  Negative for cough.   Musculoskeletal:  Positive for neck pain (left-sided).       (+) Right Shoulder Pain - some decreased ROM, affecting sleep     Objective:  Vitals: BP 130/80   Pulse 70   Temp 97.8 F (36.6 C) (Tympanic)   Ht 6\' 2"  (1.88 m)   Wt 276 lb (125.2 kg)   SpO2 98%   BMI 35.44 kg/m   Physical Exam Vitals and nursing note reviewed.  Constitutional:  General: He is not in acute distress.    Appearance: Normal appearance. He is not ill-appearing.  HENT:     Head: Normocephalic and atraumatic.     Right Ear: Hearing, tympanic membrane, ear canal and external ear normal.     Left Ear: Hearing, tympanic membrane, ear canal and external ear normal.     Mouth/Throat:     Pharynx: Oropharynx is clear.  Pulmonary:     Effort: Pulmonary effort is normal.     Breath sounds: Normal breath sounds and air entry.  Lymphadenopathy:     Cervical: Cervical adenopathy (bilateral, L>R) present.     Upper Body:     Right upper body: No supraclavicular or axillary adenopathy.     Left upper body: No supraclavicular or axillary adenopathy.     Comments:  Mobile bilaterally Left  lymph-node 1.5 cm  Right lymph-node 0.5 cm   Skin:    General: Skin is warm and dry.  Neurological:     Mental Status: He is alert and oriented to person, place, and time. Mental status is at baseline.  Psychiatric:        Mood and Affect: Mood normal.        Behavior: Behavior normal.        Thought Content: Thought content normal.        Judgment: Judgment normal.    Results:  Studies Obtained And Personally Reviewed By Me: Labs:     Component Value Date/Time   NA 140 07/10/2022 0907   K 4.2 07/10/2022 0907   CL 103 07/10/2022 0907   CO2 29 07/10/2022 0907   GLUCOSE 168 (H) 07/10/2022 0907   GLUCOSE 97 04/30/2006 1334   BUN 13 07/10/2022 0907   CREATININE 0.75 07/10/2022 0907   CALCIUM 9.1 07/10/2022 0907   PROT 6.8 07/10/2022 0907   ALBUMIN 4.3 05/25/2016 1121   AST 13 07/10/2022 0907   ALT 12 07/10/2022 0907   ALKPHOS 63 05/25/2016 1121   BILITOT 0.5 07/10/2022 0907   GFRNONAA >60 09/26/2021 1339   GFRNONAA 91 07/05/2020 1140   GFRAA 106 07/05/2020 1140    Lab Results  Component Value Date   WBC 3.7 (L) 07/10/2022   HGB 15.4 07/10/2022   HCT 44.8 07/10/2022   MCV 83.6 07/10/2022   PLT 215 07/10/2022   Lab Results  Component Value Date   CHOL 152 07/10/2022   HDL 43 07/10/2022   LDLCALC 92 07/10/2022   TRIG 81 07/10/2022   CHOLHDL 3.5 07/10/2022   Lab Results  Component Value Date   HGBA1C 6.2 (A) 05/15/2023    Lab Results  Component Value Date   PSA 0.31 07/10/2022   PSA 0.36 07/11/2021   PSA 0.54 07/05/2020     Assessment & Plan:   Left Cervical Lymphadenopathy: still experiencing moderate symptoms despite treatment with Augmentin . Switching to 100 mg Doxycycline  - take 1 tablet (100 mg total) by mouth 2 (two) times daily. Returning 07/20/23 for his annual visit - let us  know if symptoms worsen/persist despite treatment.   Diabetes Mellitus most recent HgbA1c in March 2024 was 7.2%, improved from 8.0%. Treated with 7.5 mg Mounjaro  injected  weekly.  Right Shoulder Pain: has been bothering him more, affecting his sleep and ROM. He is s/p left shoulder arthroscopy w/ acromial repair since 09/24/19 and has recently established with Ellard Gunning, MD.   Labs drawn today for CPE visit  already scheduled for later this week.   I,Emily Lagle,acting as a Neurosurgeon  for Sylvan Evener, MD.,have documented all relevant documentation on the behalf of Sylvan Evener, MD,as directed by  Sylvan Evener, MD while in the presence of Sylvan Evener, MD.   I, Sylvan Evener, MD, have reviewed all documentation for this visit. The documentation on 07/16/23 for the exam, diagnosis, procedures, and orders are all accurate and complete.

## 2023-07-16 NOTE — Patient Instructions (Addendum)
 Please take Doxycycline  100 mg twice daily for 10 days.Follow up here on Friday for regularly scheduled appt.

## 2023-07-17 LAB — CBC WITH DIFFERENTIAL/PLATELET
Absolute Lymphocytes: 1988 {cells}/uL (ref 850–3900)
Absolute Monocytes: 512 {cells}/uL (ref 200–950)
Basophils Absolute: 19 {cells}/uL (ref 0–200)
Basophils Relative: 0.4 %
Eosinophils Absolute: 212 {cells}/uL (ref 15–500)
Eosinophils Relative: 4.5 %
HCT: 49.3 % (ref 38.5–50.0)
Hemoglobin: 16.5 g/dL (ref 13.2–17.1)
MCH: 28.6 pg (ref 27.0–33.0)
MCHC: 33.5 g/dL (ref 32.0–36.0)
MCV: 85.6 fL (ref 80.0–100.0)
MPV: 9.3 fL (ref 7.5–12.5)
Monocytes Relative: 10.9 %
Neutro Abs: 1969 {cells}/uL (ref 1500–7800)
Neutrophils Relative %: 41.9 %
Platelets: 277 10*3/uL (ref 140–400)
RBC: 5.76 10*6/uL (ref 4.20–5.80)
RDW: 13.1 % (ref 11.0–15.0)
Total Lymphocyte: 42.3 %
WBC: 4.7 10*3/uL (ref 3.8–10.8)

## 2023-07-17 LAB — COMPLETE METABOLIC PANEL WITH GFR
AG Ratio: 1.8 (calc) (ref 1.0–2.5)
ALT: 16 U/L (ref 9–46)
AST: 15 U/L (ref 10–35)
Albumin: 4.6 g/dL (ref 3.6–5.1)
Alkaline phosphatase (APISO): 88 U/L (ref 35–144)
BUN: 18 mg/dL (ref 7–25)
CO2: 26 mmol/L (ref 20–32)
Calcium: 9.5 mg/dL (ref 8.6–10.3)
Chloride: 102 mmol/L (ref 98–110)
Creat: 0.78 mg/dL (ref 0.70–1.30)
Globulin: 2.6 g/dL (ref 1.9–3.7)
Glucose, Bld: 107 mg/dL — ABNORMAL HIGH (ref 65–99)
Potassium: 4.2 mmol/L (ref 3.5–5.3)
Sodium: 138 mmol/L (ref 135–146)
Total Bilirubin: 0.4 mg/dL (ref 0.2–1.2)
Total Protein: 7.2 g/dL (ref 6.1–8.1)
eGFR: 103 mL/min/{1.73_m2} (ref >=60)

## 2023-07-17 LAB — MICROALBUMIN / CREATININE URINE RATIO
Creatinine, Urine: 198 mg/dL (ref 20–320)
Microalb Creat Ratio: 3 mg/g{creat} (ref <30)
Microalb, Ur: 0.6 mg/dL

## 2023-07-17 LAB — HEMOGLOBIN A1C
Hgb A1c MFr Bld: 6.5 %{Hb} — ABNORMAL HIGH (ref <5.7)
Mean Plasma Glucose: 140 mg/dL
eAG (mmol/L): 7.7 mmol/L

## 2023-07-17 LAB — LIPID PANEL
Cholesterol: 135 mg/dL (ref <200)
HDL: 42 mg/dL (ref >=40)
LDL Cholesterol (Calc): 76 mg/dL
Non-HDL Cholesterol (Calc): 93 mg/dL (ref <130)
Total CHOL/HDL Ratio: 3.2 (calc) (ref <5.0)
Triglycerides: 89 mg/dL (ref <150)

## 2023-07-17 LAB — PSA: PSA: 0.38 ng/mL (ref <=4.00)

## 2023-07-18 ENCOUNTER — Other Ambulatory Visit: Payer: Self-pay | Admitting: Internal Medicine

## 2023-07-20 ENCOUNTER — Encounter: Payer: Self-pay | Admitting: Internal Medicine

## 2023-07-20 ENCOUNTER — Ambulatory Visit: Payer: 59 | Admitting: Internal Medicine

## 2023-07-20 VITALS — BP 130/80 | HR 74 | Ht 74.0 in | Wt 275.0 lb

## 2023-07-20 DIAGNOSIS — R590 Localized enlarged lymph nodes: Secondary | ICD-10-CM | POA: Diagnosis not present

## 2023-07-20 DIAGNOSIS — Z0000 Encounter for general adult medical examination without abnormal findings: Secondary | ICD-10-CM

## 2023-07-20 DIAGNOSIS — M19012 Primary osteoarthritis, left shoulder: Secondary | ICD-10-CM | POA: Diagnosis not present

## 2023-07-20 DIAGNOSIS — G8929 Other chronic pain: Secondary | ICD-10-CM

## 2023-07-20 DIAGNOSIS — E119 Type 2 diabetes mellitus without complications: Secondary | ICD-10-CM | POA: Diagnosis not present

## 2023-07-20 DIAGNOSIS — Z9889 Other specified postprocedural states: Secondary | ICD-10-CM

## 2023-07-20 DIAGNOSIS — M47812 Spondylosis without myelopathy or radiculopathy, cervical region: Secondary | ICD-10-CM

## 2023-07-20 DIAGNOSIS — I10 Essential (primary) hypertension: Secondary | ICD-10-CM

## 2023-07-20 DIAGNOSIS — F411 Generalized anxiety disorder: Secondary | ICD-10-CM

## 2023-07-20 DIAGNOSIS — R351 Nocturia: Secondary | ICD-10-CM

## 2023-07-20 DIAGNOSIS — M25511 Pain in right shoulder: Secondary | ICD-10-CM

## 2023-07-20 DIAGNOSIS — N401 Enlarged prostate with lower urinary tract symptoms: Secondary | ICD-10-CM

## 2023-07-20 DIAGNOSIS — M25512 Pain in left shoulder: Secondary | ICD-10-CM

## 2023-07-20 LAB — POCT URINALYSIS DIP (CLINITEK)
Bilirubin, UA: NEGATIVE
Blood, UA: NEGATIVE
Glucose, UA: NEGATIVE mg/dL
Ketones, POC UA: NEGATIVE mg/dL
Leukocytes, UA: NEGATIVE
Nitrite, UA: NEGATIVE
POC PROTEIN,UA: NEGATIVE
Spec Grav, UA: 1.015 (ref 1.010–1.025)
Urobilinogen, UA: 0.2 U/dL
pH, UA: 6.5 (ref 5.0–8.0)

## 2023-07-20 MED ORDER — OXYCODONE-ACETAMINOPHEN 10-325 MG PO TABS: ORAL_TABLET | ORAL | 0 refills | Status: DC

## 2023-07-20 NOTE — Progress Notes (Signed)
 Annual Wellness Visit   Patient Care Team: Margaree Mackintosh, MD as PCP - General (Internal Medicine)  Visit Date: 07/20/23   Chief Complaint  Patient presents with   Annual Exam   Subjective:  Patient: Chad Gardner, Male DOB: March 10, 1966, 58 y.o. MRN: [id number] Chad Gardner is a 58 y.o. Male who presents today for his Annual Wellness Visit. Patient has history of Diabetes Mellitus, Hypertension, Arthritis, Overactive Bladder, Left Wrist Tendonitis, and Cervical Spondylosis.   Was seen 07/16/2023 for Left-sided Cervical adenitis  treated with 100 mg Doxycycline BID x10 days, which he is still taking and should have 3 days left. Reporting improvement in symptoms.   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 Is s/p left shoulder arthroscopy w/ acromial repair since 09/24/19. When last seen 4 days ago mentioned right shoulder pain affecting his sleep w/ limited ROM and his recent establishment with Francena Hanly, MD to manage this. The following day, he was seen by [facility] for an MRI, of which the results can not be seen.  History of Overactive Bladder and is established with [facility]. History of erectile dysfunction but is not sexually active. Tried tamsulosin in 2017 but could not tolerate it. No 2024 records from [facility], but he expresses that he is pretty sure he was seen by them in April - will obtain records.    History of Diabetes Mellitus, type II treated with 7.5 mg Mounjaro injected weekly. Ophthalmologist is Dr. Dione Booze.   History of Hypertension treated with HCTZ 25 mg daily, Losartan 100 mg daily, Metoprolol succinate 25 mg daily. Blood Pressure: normotensive today at 130/80.   Labs 07/16/2023 CBC: WNL CMP, compared to 07/10/2022: Blood Glucose 107, decreased from 168 but still elevated; otherwise WNL Lipid Panel: WNL HgbA1c, compared to 05/15/2023: 6.5,  slightly elevated from 6.2 Microalbumin/Creatinine: WNL  Colonoscopy 07/31/14 by Dr. Loreta Ave with 3 benign polyps removed. Repeat recommendation of 2026.  PSA 0.38 07/16/2023    Vaccine Counseling: Due for Covid-19 and PNA - declines today; UTD on Shingles 2/2 and Tdap.  Past Medical History:  Diagnosis Date   Arthritis    Diabetes mellitus without complication (HCC)    Hypertension    Overactive bladder    Tendonitis of wrist, left   Medical/Surgical History Narrative:  2022 - mild case of Acute COVID-19 in November - recovered  2021 - hx of Left Shoulder Arthroscopy and Biceps Tenodesis in April by Dr. Madelon Lips   2017 - Microdiscectomy L2-L3 for Lumbar Disc Herniation with Radiculopathy in by Dr. Venetia Maxon  2011 - hx of Chronic Stenosing Tenosynovitis Repair by Dr. Teressa Senter in 2011.   Other - hx of Small Right Inguinal Hernia and Small Renal Cysts Bilaterally.  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  Separated from wife.  He has a son working in Audiological scientist in [place] who just graduated with a Event organiser.  Patient does not smoke.  Drinks beer socially when watching sports.  Does not drink any excess and recently has not been drinking at all.       Family history: Father with history of prostate cancer, living.  Mother with history of  stroke and hypertension.  They reside here in [place] and he tries to take care of them.  He has 2 brothers.  1 brother alive and well and the other brother has schizophrenia and hypertension.  1 sister overweight.   Review of Systems  Constitutional:  Negative for chills, fever, malaise/fatigue and weight loss.  HENT:  Negative for hearing loss, sinus pain and sore throat.   Respiratory:  Negative for  cough, hemoptysis and shortness of breath.   Cardiovascular:  Negative for chest pain, palpitations, leg swelling and PND.  Gastrointestinal:  Negative for abdominal pain, constipation, diarrhea, heartburn, nausea and vomiting.  Genitourinary:  Negative for dysuria, frequency and urgency.  Musculoskeletal:  Negative for back pain, myalgias and neck pain (from 2/10, left-sided, improved to tenderness).  Skin:  Negative for itching and rash.  Neurological:  Negative for dizziness, tingling, seizures and headaches.  Endo/Heme/Allergies:  Negative for polydipsia.  Psychiatric/Behavioral:  Negative for depression. The patient is not nervous/anxious.     Objective:  Vitals: BP 130/80   Pulse 74   Ht 6\' 2"  (1.88 m)   Wt 275 lb (124.7 kg)   SpO2 97%   BMI 35.31 kg/m  Physical Exam Vitals and nursing note reviewed.  Constitutional:      General: He is awake. He is not in acute distress.    Appearance: Normal appearance. He is not ill-appearing or toxic-appearing.  HENT:     Head: Normocephalic and atraumatic.     Right Ear: Tympanic membrane, ear canal and external ear normal.     Left Ear: Tympanic membrane, ear canal and external ear normal.     Mouth/Throat:     Pharynx: Oropharynx is clear.  Eyes:     Extraocular Movements: Extraocular movements intact.     Pupils: Pupils are equal, round, and reactive to light.  Neck:     Thyroid: No thyroid mass, thyromegaly or thyroid tenderness.     Vascular: No carotid bruit.  Cardiovascular:     Rate and Rhythm: Normal rate and regular rhythm. No extrasystoles are present.    Pulses:          Dorsalis pedis pulses are 1+ on the right side and 1+ on the left side.       Posterior tibial pulses are 1+ on the right side and 1+ on the left side.     Heart sounds: Normal heart sounds. No murmur heard.    No friction rub. No gallop.  Pulmonary:     Effort: Pulmonary effort is normal.     Breath sounds: Normal breath sounds. No decreased breath  sounds, wheezing, rhonchi or rales.  Chest:     Chest wall: No mass.  Abdominal:     Palpations: Abdomen is soft. There is no hepatomegaly, splenomegaly or mass.     Tenderness: There is no abdominal tenderness.     Hernia: No hernia is present.  Genitourinary:    Comments: Declines prostate exam Musculoskeletal:     Cervical back: Normal range of motion.     Right lower leg: No edema.     Left lower leg: No edema.  Feet:     Right foot:     Toenail Condition: Fungal disease present.    Left foot:     Toenail Condition: Fungal disease present. Lymphadenopathy:     Cervical: Cervical adenopathy (from 2/10, bilateral L>R, improving) present.     Upper Body:     Right upper body: No supraclavicular adenopathy.  Left upper body: No supraclavicular adenopathy.  Skin:    General: Skin is warm and dry.  Neurological:     General: No focal deficit present.     Mental Status: He is alert and oriented to person, place, and time. Mental status is at baseline.     Cranial Nerves: Cranial nerves 2-12 are intact.     Sensory: Sensation is intact.     Motor: Motor function is intact.     Coordination: Coordination is intact.     Gait: Gait is intact.     Deep Tendon Reflexes: Reflexes are normal and symmetric.  Psychiatric:        Attention and Perception: Attention normal.        Mood and Affect: Mood normal.        Speech: Speech normal.        Behavior: Behavior normal. Behavior is cooperative.        Thought Content: Thought content normal.        Cognition and Memory: Cognition and memory normal.        Judgment: Judgment normal.   Most Recent Fall Risk Assessment:    09/05/2022   10:27 AM  Fall Risk   Falls in the past year? 0  Number falls in past yr: 0  Injury with Fall? 0  Risk for fall due to : No Fall Risks  Follow up Falls prevention discussed   Most Recent Depression Screenings:    09/05/2022   10:27 AM 07/18/2022    3:00 PM  PHQ 2/9 Scores  PHQ - 2 Score 0 0    Results:  Studies Obtained And Personally Reviewed By Me:  Colonoscopy 07/31/14 by Dr. Loreta Ave with 3 benign polyps removed.   Diabetic Foot Exam - Simple   Simple Foot Form Diabetic Foot exam was performed with the following findings: Yes 07/20/2023  2:39 PM  Visual Inspection No deformities, no ulcerations, no other skin breakdown bilaterally: Yes Sensation Testing Intact to touch and monofilament testing bilaterally: Yes Pulse Check Posterior Tibialis and Dorsalis pulse intact bilaterally: Yes Comments    Labs:     Component Value Date/Time   NA 138 07/16/2023 0957   K 4.2 07/16/2023 0957   CL 102 07/16/2023 0957   CO2 26 07/16/2023 0957   GLUCOSE 107 (H) 07/16/2023 0957   GLUCOSE 97 04/30/2006 1334   BUN 18 07/16/2023 0957   CREATININE 0.78 07/16/2023 0957   CALCIUM 9.5 07/16/2023 0957   PROT 7.2 07/16/2023 0957   ALBUMIN 4.3 05/25/2016 1121   AST 15 07/16/2023 0957   ALT 16 07/16/2023 0957   ALKPHOS 63 05/25/2016 1121   BILITOT 0.4 07/16/2023 0957   GFRNONAA >60 09/26/2021 1339   GFRNONAA 91 07/05/2020 1140   GFRAA 106 07/05/2020 1140    Lab Results  Component Value Date   WBC 4.7 07/16/2023   HGB 16.5 07/16/2023   HCT 49.3 07/16/2023   MCV 85.6 07/16/2023   PLT 277 07/16/2023   Lab Results  Component Value Date   CHOL 135 07/16/2023   HDL 42 07/16/2023   LDLCALC 76 07/16/2023   TRIG 89 07/16/2023   CHOLHDL 3.2 07/16/2023   Lab Results  Component Value Date   HGBA1C 6.5 (H) 07/16/2023    Lab Results  Component Value Date   PSA 0.38 07/16/2023   PSA 0.31 07/10/2022   PSA 0.36 07/11/2021     Assessment & Plan:   Orders Placed This Encounter  Procedures  POCT URINALYSIS DIP (CLINITEK)  Other Labs Reviewed today: CBC: WNL CMP, compared to 07/10/2022: Blood Glucose 107, decreased from 168 but still elevated; otherwise WNL Lipid Panel: WNL HgbA1c, compared to 05/15/2023: 6.5, slightly elevated from 6.2 Microalbumin/Creatinine: WNL  Was seen  07/16/2023 for L. Cervical Lymphadenopathy treated with 100 mg Doxycycline BID x10 days, which he is still taking and should have 3 days left. Reporting improvement in symptoms.   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 Is s/p left shoulder arthroscopy w/ acromial repair since 09/24/19. When last seen 4 days ago mentioned right shoulder pain affecting his sleep w/ limited ROM and his recent establishment with Francena Hanly, MD to manage this. The following day, he was seen by [facility] for an MRI, of which the results can not be seen. Sending in 10-325 mg Percocet - take 0.5 - 1 tablet every 8 hours as needed for severe shoulder pain.   Nocturia and is established with [facility]. No 2024 records from [facility], but he expresses that he is pretty sure he was seen by them in April - will contact to obtain records. He is on Gemtesa per Dr. Annabell Howells   Diabetes Mellitus, type II treated with 7.5 mg Mounjaro injected weekly. Foot exam performed today. Overdue for eye exam.   Hypertension treated with HCTZ 25 mg daily, Losartan 100 mg daily, Metoprolol succinate 25 mg daily. Blood Pressure: normotensive today at 130/80.   Colonoscopy 07/31/14 by Dr. Loreta Ave with 3 benign polyps removed. Repeat recommendation of 2026.  Hx of servical spondylosis. No c/o of significant neck pain at present.  PSA 0.38 07/16/2023. Declined prostate exam today as he is pretty sure he had one done at [facility] 09/2022 - will obtain records.    Vaccine Counseling: Due for Covid-19 and PNA - declines today; UTD on Shingles 2/2 and Tdap.   Annual wellness visit done today including the all of the following: Reviewed patient's Family Medical History Reviewed and updated list of patient's medical providers Assessment of cognitive impairment was done Assessed patient's functional ability Established a written schedule for  health screening services Health Risk Assessent Completed and Reviewed  Discussed health benefits of physical activity, and encouraged him to engage in regular exercise appropriate for his age and condition.    I,Emily Lagle,acting as a Neurosurgeon for Margaree Mackintosh, MD.,have documented all relevant documentation on the behalf of Margaree Mackintosh, MD,as directed by  Margaree Mackintosh, MD while in the presence of Margaree Mackintosh, MD.   I, Margaree Mackintosh, MD, have reviewed all documentation for this visit. The documentation on 07/23/23 for the exam, diagnosis, procedures, and orders are all accurate and complete.

## 2023-07-23 NOTE — Patient Instructions (Addendum)
 Continue diet and exercise efforts. Patient will be seeing Dr. Rennis Chris for shoulder pain. Have provided a small quantity of oxycodone for him to take sparingly for severe shoulder pain. Watch diet. Try to walk some for exercise.

## 2023-07-31 ENCOUNTER — Other Ambulatory Visit: Payer: BC Managed Care – PPO

## 2023-08-03 ENCOUNTER — Encounter: Payer: BC Managed Care – PPO | Admitting: Internal Medicine

## 2023-09-11 ENCOUNTER — Other Ambulatory Visit: Payer: Self-pay | Admitting: Internal Medicine

## 2023-09-12 ENCOUNTER — Other Ambulatory Visit: Payer: Self-pay | Admitting: Internal Medicine

## 2023-09-12 ENCOUNTER — Other Ambulatory Visit: Payer: Self-pay

## 2023-09-12 DIAGNOSIS — I10 Essential (primary) hypertension: Secondary | ICD-10-CM

## 2023-09-12 MED ORDER — HYDROCHLOROTHIAZIDE 25 MG PO TABS: 25.0000 mg | ORAL_TABLET | Freq: Every day | ORAL | 0 refills | Status: AC

## 2023-10-23 LAB — HM DIABETES EYE EXAM

## 2023-11-13 ENCOUNTER — Ambulatory Visit: Payer: BC Managed Care – PPO | Admitting: Internal Medicine

## 2023-11-13 ENCOUNTER — Encounter: Payer: Self-pay | Admitting: Internal Medicine

## 2023-11-13 VITALS — BP 136/82 | HR 83 | Ht 74.0 in | Wt 267.0 lb

## 2023-11-13 DIAGNOSIS — E119 Type 2 diabetes mellitus without complications: Secondary | ICD-10-CM

## 2023-11-13 LAB — POCT GLYCOSYLATED HEMOGLOBIN (HGB A1C): Hemoglobin A1C: 5.8 % — AB (ref 4.0–5.6)

## 2023-11-13 NOTE — Patient Instructions (Signed)
 Hold  Mounjaro  7.5 mg once weekly the week prior to surgery and the week of surgery      HOW TO TREAT LOW BLOOD SUGARS (Blood sugar LESS THAN 70 MG/DL) Please follow the RULE OF 15 for the treatment of hypoglycemia treatment (when your (blood sugars are less than 70 mg/dL)   STEP 1: Take 15 grams of carbohydrates when your blood sugar is low, which includes:  3-4 GLUCOSE TABS  OR 3-4 OZ OF JUICE OR REGULAR SODA OR ONE TUBE OF GLUCOSE GEL    STEP 2: RECHECK blood sugar in 15 MINUTES STEP 3: If your blood sugar is still low at the 15 minute recheck --> then, go back to STEP 1 and treat AGAIN with another 15 grams of carbohydrates.

## 2023-11-13 NOTE — Progress Notes (Signed)
 Name: Chad Gardner  MRN/ DOB: [id number], [birth year]   Age/ Sex: 58 y.o., male    PCP: Sylvan Evener, MD   Reason for Endocrinology Evaluation: Type 2 Diabetes Mellitus     Date of Initial Endocrinology Visit: 08/31/2022    PATIENT IDENTIFIER: Chad Gardner is a 58 y.o. male with a past medical history of DM, and HTN. The patient presented for initial endocrinology clinic visit on 08/31/2022 for consultative assistance with his diabetes management.    HPI: Chad Gardner was    Diagnosed with DM 2015           Hemoglobin A1c has ranged from 6.3% in 2022, peaking at 8.4% in 2021.   On his initial visit to our clinic he had an A1c of 7.2%. Metformin  has been causing some stomach issues and bowel changes , so we stopped it After talking to multiple people, he is concerned about metformin  and is interested in "once weekly injections", so he was started on Mounjaro , I  stopped Januvia   Discontinued glipizide  05/2023 with an A1c of 6.2% and continued Mounjaro   SUBJECTIVE:   During the last visit (05/15/2023): A1c 6.2%    Today (11/13/23): Chad Gardner is here for follow-up on diabetes management.  He checks his blood sugars 1x daily . The patient has not had hypoglycemic episodes since the last clinic visit.    Patient has been noted with weight loss Schedule for right shoulder sx 12/28/2023, continues with left shoulder pain and stiffness  Denies nausea, vomiting  Denies constipation or diarrhea   HOME DIABETES REGIMEN: Mounjaro  7.5 mg weekly    Statin: no ACE-I/ARB: yes Prior Diabetic Education: no   METER DOWNLOAD SUMMARY: Unable to download 98 - 179 mg/dL    DIABETIC COMPLICATIONS: Microvascular complications:   Denies: CKD, neuropathy Last eye exam: Completed 10/2023  Macrovascular complications:   Denies: CAD, PVD, CVA   PAST HISTORY: Past Medical History:  Past Medical History:  Diagnosis Date   Arthritis    Diabetes mellitus without  complication (HCC)    Hypertension    Overactive bladder    Tendonitis of wrist, left    Past Surgical History:  Past Surgical History:  Procedure Laterality Date   BACK SURGERY     ruptured disc   SHOULDER ARTHROSCOPY Left 09/24/2019   TENDON RELEASE  06/05/2009   Lt wrist   TRANSFORAMINAL LUMBAR INTERBODY FUSION W/ MIS 1 LEVEL Right 10/04/2021   Procedure: MINIMALLY INVASIVE DECOMPRESSION AND TRANSFORAMINAL LUMBAR INTERBODY FUSION LUMBAR FIVE-SACRAL ONE, RIGHT SIDED APPROACH;  Surgeon: Pincus Bridgeman, DO;  Location: [facility] OR;  Service: Neurosurgery;  Laterality: Right;  3C    Social History:  reports that he has never smoked. He has never used smokeless tobacco. He reports current alcohol use. He reports that he does not use drugs. Family History:  Family History  Problem Relation Age of Onset   Hypertension Mother    Stroke Mother    Cancer Father    Hypertension Brother      HOME MEDICATIONS: Allergies as of 11/13/2023   No Known Allergies      Medication List        Accurate as of November 13, 2023  1:57 PM. If you have any questions, ask your nurse or doctor.          STOP taking these medications    doxycycline  100 MG tablet Commonly known as: VIBRA -TABS Stopped by: Camilla Cedar Cason Dabney   oxyCODONE -acetaminophen  10-325 MG tablet Commonly known  as: PERCOCET Stopped by: Herman Fiero J Windsor Zirkelbach       TAKE these medications    Accu-Chek Aviva Plus test strip Generic drug: glucose blood USE ONE STRIP TO CHECK GLUCOSE TWICE DAILY   Accu-Chek Aviva Plus w/Device Kit   Accu-Chek Softclix Lancets lancets USE AS DIRECTED   Gemtesa  75 MG Tabs Generic drug: Vibegron  Take 75 mg by mouth daily.   hydrochlorothiazide  25 MG tablet Commonly known as: HYDRODIURIL  Take 1 tablet by mouth once daily   hydrochlorothiazide  25 MG tablet Commonly known as: HYDRODIURIL  Take 1 tablet (25 mg total) by mouth daily.   losartan  100 MG tablet Commonly known as: COZAAR  Take  1 tablet by mouth once daily   meloxicam  15 MG tablet Commonly known as: MOBIC  Take 1 tablet every day by oral route, for shoulder inflammation.   metoprolol  succinate 25 MG 24 hr tablet Commonly known as: TOPROL -XL Take 1 tablet by mouth once daily   multivitamin with minerals Tabs tablet Take 1 tablet by mouth daily.   tirzepatide  7.5 MG/0.5ML Pen Commonly known as: MOUNJARO  Inject 7.5 mg into the skin once a week.   TURMERIC PO Take 1 capsule by mouth daily.         ALLERGIES: No Known Allergies   REVIEW OF SYSTEMS: A comprehensive ROS was conducted with the patient and is negative except as per HPI    OBJECTIVE:   VITAL SIGNS: BP 136/82 (BP Location: Left Arm, Patient Position: Sitting, Cuff Size: Normal)   Pulse 83   Ht 6\' 2"  (1.88 m)   Wt 267 lb (121.1 kg)   SpO2 96%   BMI 34.28 kg/m     Filed Weights   11/13/23 1352  Weight: 267 lb (121.1 kg)    PHYSICAL EXAM:  General: Pt appears well and is in NAD  Lungs: Clear with good BS bilat   Heart: RRR   Extremities:  Lower extremities - Trace  pretibial edema.  Neuro: MS is good with appropriate affect, pt is alert and Ox3   Dm Foot Exam 11/13/2023  The skin of the feet is intact without sores or ulcerations. The pedal pulses are 2+ on right and 2+ on left. The sensation is intact to a screening 5.07, 10 gram monofilament bilaterally     DATA REVIEWED:  Lab Results  Component Value Date   HGBA1C 6.5 (H) 07/16/2023   HGBA1C 6.2 (A) 05/15/2023   HGBA1C 6.4 (A) 11/10/2022    Latest Reference Range & Units 07/16/23 09:57  Sodium 135 - 146 mmol/L 138  Potassium 3.5 - 5.3 mmol/L 4.2  Chloride 98 - 110 mmol/L 102  CO2 20 - 32 mmol/L 26  Glucose 65 - 99 mg/dL 962 (H)  Mean Plasma Glucose mg/dL 952  BUN 7 - 25 mg/dL 18  Creatinine 8.41 - 3.24 mg/dL 4.01  Calcium 8.6 - 02.7 mg/dL 9.5  BUN/Creatinine Ratio 6 - 22 (calc) SEE NOTE:  eGFR > OR = 60 mL/min/1.23m2 103  AG Ratio 1.0 - 2.5 (calc) 1.8   AST 10 - 35 U/L 15  ALT 9 - 46 U/L 16  Total Protein 6.1 - 8.1 g/dL 7.2  Total Bilirubin 0.2 - 1.2 mg/dL 0.4  Total CHOL/HDL Ratio <5.0 (calc) 3.2  Cholesterol <200 mg/dL 253  HDL Cholesterol > OR = 40 mg/dL 42  LDL Cholesterol (Calc) mg/dL (calc) 76  MICROALB/CREAT RATIO <30 mg/g creat 3  Non-HDL Cholesterol (Calc) <130 mg/dL (calc) 93  Triglycerides <150 mg/dL 89  Old records , labs and images have been reviewed.    ASSESSMENT / PLAN / RECOMMENDATIONS:   1) Type 2 Diabetes Mellitus,Optimally controlled, Without complications - Most recent A1c of 5.8 %. Goal A1c < 7.0 %.    -A1c remains optimal -Intolerant to metformin  due to GI side effects  -Patient was advised to hold Mounjaro  the week prior to his shoulder surgery and the week of surgery until after he is discharged home, then he may resume   MEDICATIONS: Continue  Mounjaro  7.5 mg once weekly   EDUCATION / INSTRUCTIONS: BG monitoring instructions: Patient is instructed to check his blood sugars 1 times a day, fasting. Call [facility] if: BG persistently < 70  I reviewed the Rule of 15 for the treatment of hypoglycemia in detail with the patient. Literature supplied.   2) Diabetic complications:  Eye: Does not have known diabetic retinopathy.  Neuro/ Feet: Does not have known diabetic peripheral neuropathy. Renal: Patient does not have known baseline CKD. He is  on an ACEI/ARB at present.   Follow-up in 6 months    Signed electronically by: Natale Bail, MD  [facility] Endocrinology  [facility] [address] [place], [place] [address] Phone: [phone] FAX: [phone]   CC: Sylvan Evener, MD [address] [place] [address] Phone: [phone]  Fax: [phone]    Return to Endocrinology clinic as below: Future Appointments  Date Time Provider Department Center  11/13/2023  2:00 PM Raymona Boss, Julian Obey, MD [facility]  None  01/15/2024 11:00 AM [facility]  01/18/2024 11:30 AM Baxley, Jaynie Meyers, MD [facility]

## 2023-11-22 ENCOUNTER — Telehealth: Payer: Self-pay | Admitting: Internal Medicine

## 2023-11-22 ENCOUNTER — Encounter: Payer: Self-pay | Admitting: Internal Medicine

## 2023-11-22 NOTE — Telephone Encounter (Signed)
 Received phone call from patient today around 2:15 PM here at the office.  He is in [place]  visiting with family and also attended a convention.  He has just learned that he is positive for COVID-19.  He is concerned because he has type II Diabetes mellitus.  He is asking that we send in Paxlovid  for him.  Explained to him that I am not licensed to practice medicine in [place]  and will not be able to do that.  If he would like to be treated with Paxlovid , he would need to go to an urgent care or perhaps a walk-in clinic within a pharmacy such as [facility].  He has no significant shortness of breath.  He has no vomiting.  Says he feels like he has just basically a bad cold.  Has had symptoms for about 2-1/2 days.  He says he has no fever.  Patient may take Tylenol  if needed.  He is to stay well-hydrated and walk some outside to prevent atelectasis.  Needs to quarantine for 5 days.  He may call back if he has any further questions or concerns.MJB, MD

## 2024-01-15 ENCOUNTER — Other Ambulatory Visit: Payer: 59

## 2024-01-15 DIAGNOSIS — E119 Type 2 diabetes mellitus without complications: Secondary | ICD-10-CM

## 2024-01-15 LAB — HEMOGLOBIN A1C
Hgb A1c MFr Bld: 6.2 % — ABNORMAL HIGH (ref <5.7)
Mean Plasma Glucose: 131 mg/dL
eAG (mmol/L): 7.3 mmol/L

## 2024-01-17 NOTE — Progress Notes (Signed)
 Patient Care Team: Perri Ronal PARAS, MD as PCP - General (Internal Medicine)  Visit Date: 01/18/24  Subjective:   Chief Complaint  Patient presents with   Diabetes    6 month A1C recheck.    Patient PI:Chad Gardner,Male DOB:22-Nov-1965,58 y.o. FMW:[id number]   58 y.o.Male presents today for 6 months follow-up for Hypertension; Diabetes Mellitus, type II; . 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 Seen for annual visit on 07/20/2023, in the interim has been seen at [facility], an UC, Endocrinology and PT.  History of Hypertension treated with Losartan  100 mg daily and Metoprolol  succinate 25 mg daily; Dependent Edema treated with HCTZ 25 mg daily. Blood pressure normal today at 110/82. 07/16/2023 Potassium WNL at 4.2.  History of Diabetes Mellitus, type II treated with Mounjaro  7.5 mg injected weekly.  01/15/2024 HgbA1c 6.2, elevated from 5.8 in June, decreased from 6.5 in February. Followed by Dr. Marien Butts, Endocrinologist, who he last saw 11/13/2023. Eye exam 10/23/2023 at [facility] did not detect diabetic retinopathy. Since 07/2023, when he weighed 275 lbs, he has lost 14 pounds and today weighs 261 lbsBMI 33.51.  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 Followed by Dr. Franky Pointer, Orthopedist. S/p right shoulder debridement, bursectomy, partial RCR and bicep tenodesis by Dr. Pointer on 12/28/2023. He reportedly has been recovering well from this, has had some light PT but post-op he was mainly being treated with ice machine therapy. He does mention his Tendinitis, Left Wrist flaring some due to overuse as he's had to switch to using that hand.   Colonoscopy 07/31/14 by Dr. Kristie with 3 benign polyps removed. Repeat recommendation of 2026.   Past Medical History:  Diagnosis Date    Arthritis    Diabetes mellitus without complication (HCC)    Hypertension    Overactive bladder    Tendonitis of wrist, left   No Known Allergies Immunization History  Administered Date(s) Administered   DTaP 10/04/2015   Hepatitis B 10/04/2015   PFIZER(Purple Top)SARS-COV-2 Vaccination February 05, 1964, 09/04/2019, 10/01/2019   Td 07/23/2008   Tdap 07/05/2020   Zoster Recombinant(Shingrix) 08/16/2021, 01/21/2022   Past Surgical History:  Procedure Laterality Date   BACK SURGERY     ruptured disc   SHOULDER ARTHROSCOPY Left 09/24/2019   TENDON RELEASE  06/05/2009   Lt wrist   TRANSFORAMINAL LUMBAR INTERBODY FUSION W/ MIS 1 LEVEL Right 10/04/2021   Procedure: MINIMALLY INVASIVE DECOMPRESSION AND TRANSFORAMINAL LUMBAR INTERBODY FUSION LUMBAR FIVE-SACRAL ONE, RIGHT SIDED APPROACH;  Surgeon: Carollee Lani BROCKS, DO;  Location: [facility] OR;  Service: Neurosurgery;  Laterality: Right;  3C    Family History  Problem Relation Age of Onset   Hypertension Mother    Stroke Mother    Cancer Father    Hypertension Brother    Social History   Social History Narrative   Social history: 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  Resides alone.  Separated from wife.  He has a son working in Audiological scientist in [place] who just graduated with a Event organiser.  Patient does not smoke.  Drinks beer socially when watching sports.  Does not drink any excess and recently has not been drinking at all.       Family history: Father with history of prostate cancer, living.  Mother with history of stroke and hypertension.  They reside here in [place] and  he tries to take care of them.  He has 2 brothers.  1 brother alive and well and the other brother has schizophrenia and hypertension.  1 sister overweight.   Review of Systems  Constitutional:  Negative for fever and malaise/fatigue.  HENT:  Negative for congestion.   Eyes:  Negative for blurred vision.  Respiratory:  Negative for cough and shortness of  breath.   Cardiovascular:  Negative for chest pain, palpitations and leg swelling.  Gastrointestinal:  Negative for vomiting.  Musculoskeletal:  Negative for back pain.  Skin:  Negative for rash.  Neurological:  Negative for loss of consciousness and headaches.     Objective:  Vitals: BP 110/82   Pulse 83   Ht 6' 2 (1.88 m)   Wt 261 lb (118.4 kg)   SpO2 98%   BMI 33.51 kg/m   Physical Exam Vitals and nursing note reviewed.  Constitutional:      General: He is not in acute distress.    Appearance: Normal appearance. He is not ill-appearing.  HENT:     Head: Normocephalic and atraumatic.  Pulmonary:     Effort: Pulmonary effort is normal.  Skin:    General: Skin is warm and dry.  Neurological:     Mental Status: He is alert and oriented to person, place, and time. Mental status is at baseline.  Psychiatric:        Mood and Affect: Mood normal.        Behavior: Behavior normal.        Thought Content: Thought content normal.        Judgment: Judgment normal.     Results:  Studies Obtained And Personally Reviewed By Me:  Colonoscopy 07/31/14 by Dr. Kristie with 3 benign polyps removed.  Labs:  CBC w/ Differential Lab Results  Component Value Date   WBC 4.7 07/16/2023   RBC 5.76 07/16/2023   HGB 16.5 07/16/2023   HCT 49.3 07/16/2023   PLT 277 07/16/2023   MCV 85.6 07/16/2023   MCH 28.6 07/16/2023   MCHC 33.5 07/16/2023   RDW 13.1 07/16/2023   MPV 9.3 07/16/2023   LYMPHSABS 1,598 07/10/2022   MONOABS 304 05/25/2016   BASOSABS 19 07/16/2023    Comprehensive Metabolic Panel Lab Results  Component Value Date   NA 138 07/16/2023   K 4.2 07/16/2023   CL 102 07/16/2023   CO2 26 07/16/2023   GLUCOSE 107 (H) 07/16/2023   BUN 18 07/16/2023   CREATININE 0.78 07/16/2023   CALCIUM 9.5 07/16/2023   PROT 7.2 07/16/2023   ALBUMIN 4.3 05/25/2016   AST 15 07/16/2023   ALT 16 07/16/2023   ALKPHOS 63 05/25/2016   BILITOT 0.4 07/16/2023   EGFR 103 07/16/2023   GFRNONAA  >60 09/26/2021   Lipid Panel  Lab Results  Component Value Date   CHOL 135 07/16/2023   HDL 42 07/16/2023   LDLCALC 76 07/16/2023   TRIG 89 07/16/2023   A1c Lab Results  Component Value Date   HGBA1C 6.2 (H) 01/15/2024   PSA Lab Results  Component Value Date   PSA 0.38 07/16/2023   PSA 0.31 07/10/2022   PSA 0.36 07/11/2021     Assessment & Plan:   Hypertension treated with Losartan  100 mg daily and Metoprolol  succinate 25 mg daily and HCTZ 25 mg daily. Blood Pressure: normal today at 110/82. 07/16/2023 Potassium WNL at 4.2.  Diabetes Mellitus, type II treated with Mounjaro  7.5 mg injected weekly.  01/15/2024 HgbA1c 6.2, elevated from  5.8 in June, decreased from 6.5 in February. Followed by Dr. Marien Butts, Endocrinologist, who he last saw 11/13/2023. [facility] exam 10/23/2023 at [facility] did not detect diabetic retinopathy. Since 07/2023, when he weighed 275 lbs, he has lost 14 pounds and today weighs 261 lbsBMI 33.51.  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 Followed by Dr. Franky Pointer, Orthopedist. S/p right shoulder debridement, bursectomy, partial RCR and bicep tenodesis by Dr. Pointer on 12/28/2023. He reportedly has been recovering well from this, has had some light PT but post-op he was mainly being treated with ice machine therapy.   Tendinitis, Left Wrist flaring some due to overuse as he's had to switch to using that hand.   Chronic shoulder pain- impingement syndrome right shoulder  Chronic neck pain- taking daily Meloxicam   Discussed Colonoscopy 07/31/14 by Dr. Kristie with 3 benign polyps removed. Repeat recommendation of 2026.   Return in 6 months (on 07/21/2024) for annual labs, and then on 07/25/2024 for annual visit, or as needed.   I,Chad Gardner,acting as a Neurosurgeon for Ronal JINNY Hailstone, MD.,have documented all relevant documentation on the behalf of Ronal JINNY Hailstone, MD,as  directed by  Ronal JINNY Hailstone, MD while in the presence of Ronal JINNY Hailstone, MD.  I, Ronal JINNY Hailstone, MD, have reviewed all documentation for this visit. The documentation on 01/18/2024 for the exam, diagnosis, procedures, and orders are all accurate and complete.

## 2024-01-18 ENCOUNTER — Ambulatory Visit: Payer: 59 | Admitting: Internal Medicine

## 2024-01-18 ENCOUNTER — Encounter: Payer: Self-pay | Admitting: Internal Medicine

## 2024-01-18 VITALS — BP 110/82 | HR 83 | Ht 74.0 in | Wt 261.0 lb

## 2024-01-18 DIAGNOSIS — E119 Type 2 diabetes mellitus without complications: Secondary | ICD-10-CM | POA: Diagnosis not present

## 2024-01-18 DIAGNOSIS — M25511 Pain in right shoulder: Secondary | ICD-10-CM | POA: Diagnosis not present

## 2024-01-18 DIAGNOSIS — M778 Other enthesopathies, not elsewhere classified: Secondary | ICD-10-CM

## 2024-01-18 DIAGNOSIS — M542 Cervicalgia: Secondary | ICD-10-CM

## 2024-01-18 DIAGNOSIS — Z9889 Other specified postprocedural states: Secondary | ICD-10-CM | POA: Diagnosis not present

## 2024-01-18 DIAGNOSIS — M25512 Pain in left shoulder: Secondary | ICD-10-CM

## 2024-01-18 DIAGNOSIS — I10 Essential (primary) hypertension: Secondary | ICD-10-CM

## 2024-01-18 DIAGNOSIS — G8929 Other chronic pain: Secondary | ICD-10-CM

## 2024-01-25 ENCOUNTER — Other Ambulatory Visit: Payer: Self-pay | Admitting: Internal Medicine

## 2024-02-01 NOTE — Patient Instructions (Signed)
 It was a pleasure to see you. Diabetes is stable and under good control as is blood pressure. Continue current meds and return in 6 months for fasting labs and health maintenance exam. Try to walk some daily and watch your weight.

## 2024-03-04 ENCOUNTER — Other Ambulatory Visit: Payer: Self-pay | Admitting: Internal Medicine

## 2024-03-06 ENCOUNTER — Other Ambulatory Visit: Payer: Self-pay | Admitting: Internal Medicine

## 2024-04-24 ENCOUNTER — Other Ambulatory Visit: Payer: Self-pay | Admitting: Internal Medicine

## 2024-04-24 DIAGNOSIS — E119 Type 2 diabetes mellitus without complications: Secondary | ICD-10-CM

## 2024-05-03 ENCOUNTER — Other Ambulatory Visit: Payer: Self-pay | Admitting: Internal Medicine

## 2024-05-14 ENCOUNTER — Encounter: Payer: Self-pay | Admitting: Internal Medicine

## 2024-05-14 ENCOUNTER — Ambulatory Visit: Admitting: Internal Medicine

## 2024-05-14 VITALS — BP 120/80 | Ht 74.0 in | Wt 264.0 lb

## 2024-05-14 DIAGNOSIS — E119 Type 2 diabetes mellitus without complications: Secondary | ICD-10-CM

## 2024-05-14 DIAGNOSIS — Z7985 Long-term (current) use of injectable non-insulin antidiabetic drugs: Secondary | ICD-10-CM | POA: Diagnosis not present

## 2024-05-14 LAB — POCT GLYCOSYLATED HEMOGLOBIN (HGB A1C): Hemoglobin A1C: 5.9 % — AB (ref 4.0–5.6)

## 2024-05-14 MED ORDER — MOUNJARO 7.5 MG/0.5ML ~~LOC~~ SOAJ: 7.5000 mg | SUBCUTANEOUS | 3 refills | Status: AC

## 2024-05-14 NOTE — Patient Instructions (Signed)
 Continue  Mounjaro  7.5 mg once weekly  HOW TO TREAT LOW BLOOD SUGARS (Blood sugar LESS THAN 70 MG/DL) Please follow the RULE OF 15 for the treatment of hypoglycemia treatment (when your (blood sugars are less than 70 mg/dL)   STEP 1: Take 15 grams of carbohydrates when your blood sugar is low, which includes:  3-4 GLUCOSE TABS  OR 3-4 OZ OF JUICE OR REGULAR SODA OR ONE TUBE OF GLUCOSE GEL    STEP 2: RECHECK blood sugar in 15 MINUTES STEP 3: If your blood sugar is still low at the 15 minute recheck --> then, go back to STEP 1 and treat AGAIN with another 15 grams of carbohydrates.

## 2024-05-14 NOTE — Progress Notes (Signed)
 Name: Chad Gardner  MRN/ DOB: [id number], [birth year]   Age/ Sex: 58 y.o., male    PCP: Perri Ronal PARAS, MD   Reason for Endocrinology Evaluation: Type 2 Diabetes Mellitus     Date of Initial Endocrinology Visit: 08/31/2022    PATIENT IDENTIFIER: Chad Gardner is a 58 y.o. male with a past medical history of DM, and HTN. The patient presented for initial endocrinology clinic visit on 08/31/2022 for consultative assistance with his diabetes management.    HPI: Chad Gardner was    Diagnosed with DM 2015           Hemoglobin A1c has ranged from 6.3% in 2022, peaking at 8.4% in 2021.   On his initial visit to our clinic he had an A1c of 7.2%. Metformin  has been causing some stomach issues and bowel changes , so we stopped it After talking to multiple people, he is concerned about metformin  and is interested in once weekly injections, so he was started on Mounjaro , I  stopped Januvia   Discontinued glipizide  05/2023 with an A1c of 6.2% and continued Mounjaro   SUBJECTIVE:   During the last visit (11/13/2023): A1c 5.8%    Today (05/14/24): Chad Gardner is here for follow-up on diabetes management.  He checks his blood sugars 1x daily . The patient has not had hypoglycemic episodes since the last clinic visit.    Patient continues with weight loss since his last visit here  He is S/P right should sx 12/28/2023, continues with left shoulder pain and stiffness . On PT  No nausea  No constipation , he is on a protein diet as well as exercise   HOME DIABETES REGIMEN: Mounjaro  7.5 mg weekly    Statin: no ACE-I/ARB: yes Prior Diabetic Education: no   METER DOWNLOAD SUMMARY: n/a    DIABETIC COMPLICATIONS: Microvascular complications:   Denies: CKD, neuropathy Last eye exam: Completed 10/23/2023  Macrovascular complications:   Denies: CAD, PVD, CVA   PAST HISTORY: Past Medical History:  Past Medical History:  Diagnosis Date   Arthritis    Diabetes mellitus  without complication (HCC)    Hypertension    Overactive bladder    Tendonitis of wrist, left    Past Surgical History:  Past Surgical History:  Procedure Laterality Date   BACK SURGERY     ruptured disc   SHOULDER ARTHROSCOPY Left 09/24/2019   TENDON RELEASE  06/05/2009   Lt wrist   TRANSFORAMINAL LUMBAR INTERBODY FUSION W/ MIS 1 LEVEL Right 10/04/2021   Procedure: MINIMALLY INVASIVE DECOMPRESSION AND TRANSFORAMINAL LUMBAR INTERBODY FUSION LUMBAR FIVE-SACRAL ONE, RIGHT SIDED APPROACH;  Surgeon: Carollee Lani BROCKS, DO;  Location: [facility] OR;  Service: Neurosurgery;  Laterality: Right;  3C    Social History:  reports that he has never smoked. He has never used smokeless tobacco. He reports current alcohol use. He reports that he does not use drugs. Family History:  Family History  Problem Relation Age of Onset   Hypertension Mother    Stroke Mother    Cancer Father    Hypertension Brother      HOME MEDICATIONS: Allergies as of 05/14/2024   No Known Allergies      Medication List        Accurate as of May 14, 2024  2:05 PM. If you have any questions, ask your nurse or doctor.          Accu-Chek Aviva Plus test strip Generic drug: glucose blood USE ONE STRIP TO CHECK GLUCOSE TWICE DAILY  Accu-Chek Aviva Plus w/Device Kit   Accu-Chek Softclix Lancets lancets USE AS DIRECTED TWICE DAILY   Gemtesa  75 MG Tabs Generic drug: Vibegron  Take 75 mg by mouth daily.   hydrochlorothiazide  25 MG tablet Commonly known as: HYDRODIURIL  Take 1 tablet (25 mg total) by mouth daily.   hydrochlorothiazide  25 MG tablet Commonly known as: HYDRODIURIL  Take 1 tablet by mouth once daily   losartan  100 MG tablet Commonly known as: COZAAR  Take 1 tablet by mouth once daily   meloxicam  15 MG tablet Commonly known as: MOBIC  Take 1 tablet every day by oral route, for shoulder inflammation.   metoprolol  succinate 25 MG 24 hr tablet Commonly known as: TOPROL -XL Take 1 tablet by mouth  once daily   Mounjaro  7.5 MG/0.5ML Pen Generic drug: tirzepatide  INJECT 7 1/2 (ONE-HALF) MG SUBCUTANEOUSLY  ONCE A WEEK   multivitamin with minerals Tabs tablet Take 1 tablet by mouth daily.   traMADol 100 MG 24 hr tablet Commonly known as: ULTRAM-ER Take 100 mg by mouth every morning.   TURMERIC PO Take 1 capsule by mouth daily.         ALLERGIES: No Known Allergies   REVIEW OF SYSTEMS: A comprehensive ROS was conducted with the patient and is negative except as per HPI    OBJECTIVE:   VITAL SIGNS: BP 120/80   Ht 6' 2 (1.88 m)   Wt 264 lb (119.7 kg)   BMI 33.90 kg/m     Filed Weights   05/14/24 1350  Weight: 264 lb (119.7 kg)     PHYSICAL EXAM:  General: Pt appears well and is in NAD  Lungs: Clear with good BS bilat   Heart: RRR   Extremities:  Lower extremities - Trace  pretibial edema.  Neuro: MS is good with appropriate affect, pt is alert and Ox3   Dm Foot Exam 11/13/2023  The skin of the feet is intact without sores or ulcerations. The pedal pulses are 2+ on right and 2+ on left. The sensation is intact to a screening 5.07, 10 gram monofilament bilaterally     DATA REVIEWED:  Lab Results  Component Value Date   HGBA1C 5.9 (A) 05/14/2024   HGBA1C 6.2 (H) 01/15/2024   HGBA1C 5.8 (A) 11/13/2023    Latest Reference Range & Units 07/16/23 09:57  Sodium 135 - 146 mmol/L 138  Potassium 3.5 - 5.3 mmol/L 4.2  Chloride 98 - 110 mmol/L 102  CO2 20 - 32 mmol/L 26  Glucose 65 - 99 mg/dL 892 (H)  Mean Plasma Glucose mg/dL 859  BUN 7 - 25 mg/dL 18  Creatinine 9.29 - 8.69 mg/dL 9.21  Calcium 8.6 - 89.6 mg/dL 9.5  BUN/Creatinine Ratio 6 - 22 (calc) SEE NOTE:  eGFR > OR = 60 mL/min/1.40m2 103  AG Ratio 1.0 - 2.5 (calc) 1.8  AST 10 - 35 U/L 15  ALT 9 - 46 U/L 16  Total Protein 6.1 - 8.1 g/dL 7.2  Total Bilirubin 0.2 - 1.2 mg/dL 0.4  Total CHOL/HDL Ratio <5.0 (calc) 3.2  Cholesterol <200 mg/dL 864  HDL Cholesterol > OR = 40 mg/dL 42  LDL  Cholesterol (Calc) mg/dL (calc) 76  MICROALB/CREAT RATIO <30 mg/g creat 3  Non-HDL Cholesterol (Calc) <130 mg/dL (calc) 93  Triglycerides <150 mg/dL 89     Old records , labs and images have been reviewed.    ASSESSMENT / PLAN / RECOMMENDATIONS:   1) Type 2 Diabetes Mellitus,Optimally controlled, Without complications - Most recent A1c of 5.9 %.  Goal A1c < 7.0 %.    -A1c remains optimal -Intolerant to metformin  due to GI side effects  - Patient is undergoing physical therapy for right shoulder surgery, he was found to have a biceps tear.  I did discuss the risk of muscle wasting with GLP-1 agonist.  I did encourage the patient to continue with protein intake as well as incorporate strengthening exercises - We have opted to remain on current dose of Mounjaro  until his shoulder has completely healed   MEDICATIONS: Continue  Mounjaro  7.5 mg once weekly   EDUCATION / INSTRUCTIONS: BG monitoring instructions: Patient is instructed to check his blood sugars 1 times a day, fasting. Call [facility] if: BG persistently < 70  I reviewed the Rule of 15 for the treatment of hypoglycemia in detail with the patient. Literature supplied.   2) Diabetic complications:  Eye: Does not have known diabetic retinopathy.  Neuro/ Feet: Does not have known diabetic peripheral neuropathy. Renal: Patient does not have known baseline CKD. He is  on an ACEI/ARB at present.   Follow-up in 6 months    Signed electronically by: Stefano Redgie Butts, MD  [facility]  [facility] [address] [place], [place] [address] Phone: [phone] FAX: [phone]   CC: Perri Ronal PARAS, MD [address] [place] [address] Phone: [phone]  Fax: [phone]    Return to Endocrinology clinic as below: Future Appointments  Date Time Provider Department Center  07/21/2024  9:40 AM [facility] [address]  07/25/2024 10:30 AM  [place], Ronal PARAS, MD [facility] [address]

## 2024-07-03 ENCOUNTER — Other Ambulatory Visit: Payer: Self-pay | Admitting: Internal Medicine

## 2024-07-11 NOTE — Progress Notes (Incomplete)
 "   Patient Care Team: Perri Ronal PARAS, MD as PCP - General (Internal Medicine)  Visit Date: 07/11/24  Subjective:    Patient ID: Chad Gardner , Male   DOB: 1965-11-18, 59 y.o.    MRN: [id number]   59 y.o. Male presents today for 6 month follow up for ***. Patient has a past medical history of ***.  History of Hypertension treated with Losartan  100 mg daily and Metoprolol  succinate 25 mg daily; Dependent Edema treated with HCTZ 25 mg daily.    History of Diabetes Mellitus, type II treated with Mounjaro  7.5 mg injected weekly. Followed by Dr. Marien Butts, Endocrinologist, who he last saw 11/13/2023. [facility] exam 10/23/2023 at [facility] did not detect diabetic retinopathy.   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 Followed by Dr. Franky Pointer, Orthopedist. S/p right shoulder debridement, bursectomy, partial RCR and bicep tenodesis by Dr. Pointer on 12/28/2023. He reportedly has been recovering well from this, has had some light PT but post-op he was mainly being treated with ice machine therapy. He does mention his Tendinitis, Left Wrist flaring some due to overuse as he's had to switch to using that hand.    Past Medical History:  Diagnosis Date   Arthritis    Diabetes mellitus without complication (HCC)    Hypertension    Overactive bladder    Tendonitis of wrist, left      Family History  Problem Relation Age of Onset   Hypertension Mother    Stroke Mother    Cancer Father    Hypertension Brother     Social History   Social History Narrative   Social history: He is an personnel officer at [employer].  He is right-handed.  Resides alone.  Separated from wife.  He has a son working in audiological scientist in [place] who just graduated with a event organiser.  Patient does not smoke.  Drinks beer socially when watching sports.  Does not drink any excess and recently has not been drinking at  all.       Family history: Father with history of prostate cancer, living.  Mother with history of stroke and hypertension.  They reside here in [place] and he tries to take care of them.  He has 2 brothers.  1 brother alive and well and the other brother has schizophrenia and hypertension.  1 sister overweight.      ROS      Objective:   Vitals: There were no vitals taken for this visit.   Physical Exam    Results:   Studies obtained and personally reviewed by me:  Imaging, colonoscopy, mammogram, bone density scan, echocardiogram, heart cath, stress test, CT calcium score, etc. ***   Labs:       Component Value Date/Time   NA 138 07/16/2023 0957   K 4.2 07/16/2023 0957   CL 102 07/16/2023 0957   CO2 26 07/16/2023 0957   GLUCOSE 107 (H) 07/16/2023 0957   GLUCOSE 97 04/30/2006 1334   BUN 18 07/16/2023 0957   CREATININE 0.78 07/16/2023 0957   CALCIUM 9.5 07/16/2023 0957   PROT 7.2 07/16/2023 0957   ALBUMIN 4.3 05/25/2016 1121   AST 15 07/16/2023 0957   ALT 16 07/16/2023 0957   ALKPHOS 63 05/25/2016 1121   BILITOT 0.4 07/16/2023 0957   GFRNONAA >60 09/26/2021 1339   GFRNONAA 91 07/05/2020 1140   GFRAA 106 07/05/2020 1140     Lab  Results  Component Value Date   WBC 4.7 07/16/2023   HGB 16.5 07/16/2023   HCT 49.3 07/16/2023   MCV 85.6 07/16/2023   PLT 277 07/16/2023    Lab Results  Component Value Date   CHOL 135 07/16/2023   HDL 42 07/16/2023   LDLCALC 76 07/16/2023   TRIG 89 07/16/2023   CHOLHDL 3.2 07/16/2023    Lab Results  Component Value Date   HGBA1C 5.9 (A) 05/14/2024     No results found for: TSH   Lab Results  Component Value Date   PSA 0.38 07/16/2023   PSA 0.31 07/10/2022   PSA 0.36 07/11/2021   *** delete for male pts  ***    Assessment & Plan:   ***    I,Makayla C Reid,acting as a scribe for Ronal JINNY Hailstone, MD.,have documented all relevant documentation on the behalf of Ronal JINNY Hailstone, MD,as directed by  Ronal JINNY Hailstone, MD while in the presence of Ronal JINNY Hailstone, MD.   ***   "

## 2024-07-21 ENCOUNTER — Other Ambulatory Visit: Payer: Self-pay

## 2024-07-25 ENCOUNTER — Ambulatory Visit: Payer: Self-pay | Admitting: Internal Medicine

## 2024-11-12 ENCOUNTER — Ambulatory Visit: Admitting: Internal Medicine
